# Patient Record
Sex: Female | Born: 1935
Health system: Southern US, Community
[De-identification: ages and names within clinical notes are randomized; demographics above are authoritative.]

## PROBLEM LIST (undated history)

## (undated) DIAGNOSIS — M4800 Spinal stenosis, site unspecified: Secondary | ICD-10-CM

## (undated) DIAGNOSIS — I10 Essential (primary) hypertension: Secondary | ICD-10-CM

## (undated) DIAGNOSIS — J349 Unspecified disorder of nose and nasal sinuses: Secondary | ICD-10-CM

## (undated) DIAGNOSIS — R0602 Shortness of breath: Secondary | ICD-10-CM

## (undated) DIAGNOSIS — C50919 Malignant neoplasm of unspecified site of unspecified female breast: Secondary | ICD-10-CM

## (undated) DIAGNOSIS — M810 Age-related osteoporosis without current pathological fracture: Secondary | ICD-10-CM

## (undated) DIAGNOSIS — M549 Dorsalgia, unspecified: Secondary | ICD-10-CM

## (undated) DIAGNOSIS — N3943 Post-void dribbling: Secondary | ICD-10-CM

## (undated) DIAGNOSIS — R194 Change in bowel habit: Secondary | ICD-10-CM

## (undated) DIAGNOSIS — Z972 Presence of dental prosthetic device (complete) (partial): Secondary | ICD-10-CM

## (undated) DIAGNOSIS — E079 Disorder of thyroid, unspecified: Secondary | ICD-10-CM

## (undated) DIAGNOSIS — Z9289 Personal history of other medical treatment: Secondary | ICD-10-CM

## (undated) DIAGNOSIS — D649 Anemia, unspecified: Secondary | ICD-10-CM

## (undated) HISTORY — DX: Anemia, unspecified: D64.9

## (undated) HISTORY — DX: Unspecified disorder of nose and nasal sinuses: J34.9

## (undated) HISTORY — DX: Disorder of thyroid, unspecified: E07.9

## (undated) HISTORY — DX: Change in bowel habit: R19.4

## (undated) HISTORY — DX: Post-void dribbling: N39.43

## (undated) HISTORY — DX: Presence of dental prosthetic device (complete) (partial): Z97.2

## (undated) HISTORY — DX: Shortness of breath: R06.02

## (undated) HISTORY — DX: Malignant neoplasm of unspecified site of unspecified female breast: C50.919

## (undated) HISTORY — DX: Dorsalgia, unspecified: M54.9

## (undated) HISTORY — DX: Essential (primary) hypertension: I10

## (undated) HISTORY — DX: Spinal stenosis, site unspecified: M48.00

## (undated) HISTORY — DX: Age-related osteoporosis without current pathological fracture: M81.0

## (undated) HISTORY — DX: Personal history of other medical treatment: Z92.89

---

## 1967-05-30 HISTORY — PX: BARTHOLIN GLAND CYST EXCISION: SHX565

## 1977-05-02 HISTORY — PX: BREAST LUMPECTOMY: SHX2

## 1999-12-13 ENCOUNTER — Inpatient Hospital Stay (HOSPITAL_COMMUNITY): Admission: EM | Admit: 1999-12-13 | Discharge: 1999-12-14 | Payer: Self-pay | Admitting: Emergency Medicine

## 1999-12-13 ENCOUNTER — Encounter: Payer: Self-pay | Admitting: Emergency Medicine

## 1999-12-15 ENCOUNTER — Encounter: Payer: Self-pay | Admitting: Emergency Medicine

## 1999-12-15 ENCOUNTER — Inpatient Hospital Stay (HOSPITAL_COMMUNITY): Admission: EM | Admit: 1999-12-15 | Discharge: 1999-12-28 | Payer: Self-pay | Admitting: Emergency Medicine

## 1999-12-15 ENCOUNTER — Encounter: Payer: Self-pay | Admitting: Internal Medicine

## 1999-12-15 HISTORY — PX: HERNIA REPAIR: SHX51

## 1999-12-17 ENCOUNTER — Encounter (HOSPITAL_BASED_OUTPATIENT_CLINIC_OR_DEPARTMENT_OTHER): Payer: Self-pay | Admitting: General Surgery

## 1999-12-18 ENCOUNTER — Encounter (HOSPITAL_BASED_OUTPATIENT_CLINIC_OR_DEPARTMENT_OTHER): Payer: Self-pay | Admitting: General Surgery

## 1999-12-19 ENCOUNTER — Encounter: Payer: Self-pay | Admitting: Internal Medicine

## 1999-12-19 ENCOUNTER — Encounter (HOSPITAL_BASED_OUTPATIENT_CLINIC_OR_DEPARTMENT_OTHER): Payer: Self-pay | Admitting: General Surgery

## 1999-12-21 ENCOUNTER — Encounter: Payer: Self-pay | Admitting: Internal Medicine

## 1999-12-22 ENCOUNTER — Encounter: Payer: Self-pay | Admitting: Internal Medicine

## 1999-12-23 ENCOUNTER — Encounter: Payer: Self-pay | Admitting: Internal Medicine

## 1999-12-24 ENCOUNTER — Encounter: Payer: Self-pay | Admitting: Internal Medicine

## 1999-12-26 ENCOUNTER — Encounter: Payer: Self-pay | Admitting: Internal Medicine

## 2000-01-10 ENCOUNTER — Encounter: Admission: RE | Admit: 2000-01-10 | Discharge: 2000-01-10 | Payer: Self-pay | Admitting: Internal Medicine

## 2001-05-24 ENCOUNTER — Other Ambulatory Visit: Admission: RE | Admit: 2001-05-24 | Discharge: 2001-05-24 | Payer: Self-pay | Admitting: *Deleted

## 2004-04-26 ENCOUNTER — Other Ambulatory Visit: Admission: RE | Admit: 2004-04-26 | Discharge: 2004-04-26 | Payer: Self-pay | Admitting: *Deleted

## 2007-05-30 DIAGNOSIS — Z9289 Personal history of other medical treatment: Secondary | ICD-10-CM

## 2007-05-30 HISTORY — DX: Personal history of other medical treatment: Z92.89

## 2007-10-16 ENCOUNTER — Inpatient Hospital Stay (HOSPITAL_COMMUNITY): Admission: EM | Admit: 2007-10-16 | Discharge: 2007-10-18 | Payer: Self-pay | Admitting: Emergency Medicine

## 2010-06-23 ENCOUNTER — Ambulatory Visit
Admission: RE | Admit: 2010-06-23 | Discharge: 2010-06-23 | Payer: Self-pay | Source: Home / Self Care | Attending: Vascular Surgery | Admitting: Vascular Surgery

## 2010-06-24 NOTE — Assessment & Plan Note (Signed)
OFFICE VISIT  Candace Ross, Candace Ross DOB:  Jul 02, 1926                                       06/23/2010 CHART#:[id number]  CHIEF COMPLAINT:  Leg pain.  HISTORY OF PRESENT ILLNESS:  The patient is an 77 year old female referred by Dr. Eloise Harman for possible peripheral arterial disease.  The patient states she fell in December of 2010 and her legs have been hurting intermittently since then.  She states that over the last 2 months her right leg has hurt significantly worse.  She states that on some days she has no problems with her legs at all but on other days she states that her legs will hurt if she is sitting or walking.  Some days she is able to walk only 10 feet, some days she is able to walk as much as 2 blocks.  She describes the pain as achy in character.  She also notes that she has a mild limb discrepancy and has walked with a limp since birth.  She states that some of this may have contributed to her leg pain.  She denies rest pain.  She denies nonhealing ulcers on her feet.  CHRONIC MEDICAL PROBLEMS:  Include hypertension, congestive failure. These are currently stable and followed by Dr. Eloise Harman.  PAST SURGICAL HISTORY:  Bartholin cyst, left breast biopsy, femoral hernia, previous kidney donation.  SOCIAL HISTORY:  She is widowed.  No children.  Retired.  She does not smoke or consume alcohol regularly.  FAMILY HISTORY:  Not remarkable for vascular disease at age less than 52.  REVIEW OF SYSTEMS:  GENERAL:  She is 4 feet 10. PULMONARY:  She has occasional bronchitis. MUSCULOSKELETAL:  Muscle pain as described above. GI:  Hiatal hernia with some mild dysphagia. All other systems are negative.  MEDICATIONS:  Benicar, amlodipine, thyroid medication, latanoprost eyedrops, Bufferin and hydrocodone.  ALLERGIES:  She has no known drug allergies.  PHYSICAL EXAM:  Vital signs:  Blood pressure is 161/81 in the left arm, heart rate is 92 and  regular, respirations 18.  HEENT:  Unremarkable. Neck:  Has 2+ carotid pulses without bruit.  Chest:  Clear to auscultation.  Cardiac:  Regular rate and rhythm without murmur. Abdomen:  Soft, nontender, no masses.  Musculoskeletal:  She has no obvious major joint deformity.  Neurologic:  She has symmetric upper extremity and lower extremity motor strength which is 5/5.  Skin:  Has no open ulcers or rashes.  Peripheral vascular:  Shows 2+ femoral pulses bilaterally.  She has a 1+ dorsalis pedis pulse on the right with absent posterior tibial pulses.  I am unable to palpate pedal pulses in the left foot.  Both feet are pink, warm and well-perfused.  I reviewed her ABIs dated 05/13/2010 which showed an ABI on the right of 0.8 and on the left of 0.44.  In summary, I agree with Dr. Eloise Harman that the patient certainly has some component of neurogenic claudication type symptoms from her spine disease.  However, she probably also has some element of arterial occlusive disease contributing to some of her lower extremity symptoms. She does have a palpable pulse on the right leg so this does not really explain her symptoms on the right side.  However, she does have some evidence of occlusive disease in the left side with an ABI of 0.4 that may explain some of her left symptoms.  However, during her history she complained really of symptoms only in her right leg and no symptoms in her left so I do not believe that the arterial disease in her left leg is necessarily clinically relevant at this time.  I also discussed with her that she had evidence of this arterial occlusive disease but she was not at risk of limb loss currently and she was not really interested in any intervention unless absolutely necessary.  In light of this I have counseled her today in trying to start a walking program of 30 minutes daily.  She will follow up with [place] on an as-needed basis.    Candace Ross. Fields,  MD Electronically Signed  CEF/MEDQ  D:  06/24/2010  T:  06/24/2010  Job:  [id number]  cc:   Barry Dienes. Eloise Harman, M.D.

## 2010-07-14 ENCOUNTER — Ambulatory Visit: Payer: PRIVATE HEALTH INSURANCE

## 2010-10-11 NOTE — H&P (Signed)
NAMEANIE, JUNIEL NO.:  [id number]   MEDICAL RECORD NO.:  [id number]           PATIENT TYPE:   LOCATION:                                 FACILITY:   PHYSICIAN:  Barry Dienes. Eloise Harman, M.D.DATE OF BIRTH:  [birth year]   DATE OF ADMISSION:  DATE OF DISCHARGE:                              HISTORY & PHYSICAL   CHIEF COMPLAINT:  Fever.   HISTORY OF PRESENT ILLNESS:  The patient is a 77 year old white female  with 2-3 day history of a mildly productive cough with fever.  She has  also noted some chills and right neck and shoulder pain over the past 2  days.  She denies shortness of breath.  She does have mild dysuria  without frequency or abdominal pain, neck stiffness, headache, or  diarrhea.  She has had no contacts recently with fever and chills of  unknown etiology.  In the emergency room, she was given Rocephin and  Tylenol and feels much better.  She no longer has right shoulder pain.   PAST MEDICAL HISTORY:  Hypothyroidism, glaucoma, macular degeneration  status post nephrectomy in 1988 as a donor to her son, scoliosis,  osteoporosis, vitamin D deficiency, and hyperlipidemia.   MEDICATIONS PRIOR TO ADMISSION:  1. Synthroid 75 mcg p.o. daily.  2. Multivitamin 1 tab p.o. daily.  3. Vitamin D 800 units daily.  4. Calcium 500 mg daily.  5. Xalatan 0.005% eye drops daily.  6. Multiple supplements including magnesium, bilberry, coenzyme Q10,      vitamin B12, cranberry fruit, grape seed extract, and vitamin E.   ALLERGIES:  No known drug allergies.   PAST SURGICAL HISTORY:  1. In 1969, Bartholin cyst removal.  2. In 1975, cholecystectomy.  3. In 1978, breast lumpectomy (benign).  4. In 1988, unilateral nephrectomy as a kidney donor.  5. In 2001, incarcerated femoral hernia.   SOCIAL HISTORY:  She is a widow and is retired.  She has no history of  tobacco or alcohol abuse.   FAMILY HISTORY:  Her mother died at age 75 of pneumonia and her father  died at  age 16 of cancer of uncertain type.  There is a family history  of relatives that have had diabetes mellitus type 2 and colon cancer.  Her son died at age 67 of complications of diabetes mellitus type 2 and  heart disease on Oct 12, 2003   REVIEW OF SYSTEMS:  Notable for recent fever with chills and decreased  appetite.  She denies shortness of breath, substernal chest pain or  palpitations, or back pain.   INITIAL PHYSICAL EXAM:  VITAL SIGNS:  Blood pressure was 120/62, pulse  88, respirations 20, temperature 97.4 (note initial temperature was  103.4 degrees Fahrenheit), and pulse oxygen saturation 97% on room air.  GENERAL:  She is an elderly white female who is in no apparent distress  while lying supine in bed.  HEENT:  Within normal limits.  NECK:  Supple with a full range of motion and no carotid bruit or  jugular venous distention.  CHEST:  Clear to auscultation.  HEART:  Regular  rate and rhythm without significant murmur or gallop.  ABDOMEN:  Normal bowel sounds and no hepatosplenomegaly or tenderness.  EXTREMITIES:  Without cyanosis, clubbing, or edema.  NEUROLOGICAL:  Nonfocal, and she was alert and well oriented.   LABORATORY STUDIES:  Serum sodium 133, potassium 4.2, chloride 103, BUN  15, creatinine 1.3, and glucose 156.  White blood cell count 12.7 with  87% neutrophils, 5% lymphs, and 8% monocytes.  Hemoglobin 12.3,  hematocrit 35.8, and platelets 248,000.  Urinalysis had specific gravity  of 1.016 and was nitrite negative with 21-50 white blood cells, 3-6 red  blood cells and few bacteria.  A chest x-ray showed no acute  cardiopulmonary disease.   IMPRESSION AND PLAN:  1. Fever most likely secondary to early pneumonia versus an urinary      tract infection, a hepatobiliary etiology causing right shoulder      pain seems less likely with her being status post cholecystectomy.      I plan to continue Rocephin IV.  We will check urine culture      results.  We will  continue IV fluid and overnight observation.  We      will recheck a CBC in the morning.  2. Hypothyroidism:  She is clinically euthyroid on Synthroid 75 mcg      daily which will be continued.           ______________________________  Barry Dienes Eloise Harman, M.D.     DGP/MEDQ  D:  10/16/2007  T:  10/17/2007  Job:  [id number]

## 2010-10-14 NOTE — Op Note (Signed)
[place]. [facility]  Patient:    Candace Ross, Candace Ross                      MRN: [id number] Proc. Date: 12/15/99 Adm. Date:  [id number] Attending:  Alfonso Ramus CC:         Two copies to Dr. Lurene Shadow                           Operative Report  PREOPERATIVE DIAGNOSIS:  Strangulated femoral hernia.  POSTOPERATIVE DIAGNOSIS:  Strangulated femoral hernia.  OPERATION: 1. Exploratory laparotomy and small bowel resection. 2. Repair of strangulated femoral hernia.  SURGEON:  [name], M.D.  ASSISTANT:  Marnee Spring. Wiliam Ke, M.D.  ANESTHESIA:  General  NOTE:  This patient is a 77 year old woman presenting through the emergency room with severe right groin pain with a right sided groin mass associated abdominal distention, nausea, and vomiting. She was evaluated by CT scan of the abdomen which showed that she had a strangulated femoral hernia with a loop of bowel caught within the right groin.  She was prepared and taken to the operating room for repair.  DESCRIPTION OF PROCEDURE:  Following induction of anesthesia, the patient positioned supinely, the abdomen and groin prepped and draped to be included in a sterile operative field.  By a transverse obliquely placed incision over the right groin mass deepened through the skin and subcutaneous tissue and carried down to the incarcerated hernial sac.  Upon opening up the sac there was a portion of gangrenous bowel caught within the hernia.  Attention was then turned to the abdomen where a McBurney type incision was made in the right lower quadrant, deepened through the skin and subcutaneous tissue with division of the muscles in direction of their fibers down to the peritoneum. The peritoneum was opened and the incarcerated bowel was reduced back into the peritoneal cavity and brought up at the region of the wound.  The nonviable bowel was then resected between GIA staplers with the mesentry being  taken between clamps and secured with ties of 2-0 silk.  A functional end to end anastomosis was then carried out with GIA stapler and TA 55 stapling device with a good and adequate lumen and the mesenteric defect was closed with interrupted 3-0 Vicryl sutures.  The bowel was then returned to the peritoneal cavity and the abdomen wound closed in layers as follows:  The peritoneum closed in a running 0 Vicryl and the flank muscles reapproximated with running Vicryl sutures.  The subcutaneous tissues was closed with 2-0 Vicryl and the skin closed with staples.  We then changed our gown and gloves and returned to the groin where the groin hernia was repaired by placing a mesh plug into the defect and suturing this down to the Coopers ligament and to the inguinal ligament.  Thus closing the hernial defect.  Sponge, instrument and sharp counts were again verified and the groin closed in layers with the subcutaneous tissues being closed with interrupted 2-0 Vicryl sutures and the skin closed with staples. Sterile dressings were applied.  Anesthetic was reversed. The patient was removed from the operating room to the recovery room in stable condition, having tolerated the procedure well. DD:  12/15/99 TD:  12/17/99 Job: [id number]

## 2010-10-14 NOTE — Discharge Summary (Signed)
[place]. [facility]  Patient:    Candace Ross, Candace Ross                      MRN: [id number] Adm. Date:  [id number] Disc. Date: 12/14/99 Attending:  Alfonso Ramus Dictator:   Nancee Liter, M.D. CC:         Outpatient Clinic             Jesse Sans. Wall, M.D. [facility]             [facility]                           Discharge Summary  1926/06/05  DISCHARGE DIAGNOSES: 1. Gastroenteritis, likely viral. 2. Sinus tachycardia with occasional premature atrial contractions. 3. Hypothyroidism.  DISCHARGE MEDICATIONS: 1. Metoprolol 25 mg 1 p.o. b.i.d. 2. Levothyroxine 12.5 mcg 1 p.o. q.d. 3. Percocet 325/5 mg 1 p.o. q.4-6h. p.r.n. #15.  FOLLOW-UP: 1. On December 26, 1999, at 3 p.m. with [facility], medicine outpatient clinic. 2. On December 28, 1999, with [facility] at 11:30 a.m.  Please check    thyroid stimulating hormone after two to four weeks and assess for side    effects of thyroid hormone treatment and need for changes in levothyroxine    treatment.  PROCEDURES: 1. December 14, 1999, echocardiogram showing ejection fraction of 55%. 2. December 13, 1999, chest x-ray showing slight cardiomegaly, positive scoliosis,    but no acute disease. 3. December 13, 1999, abdominal x-ray which was negative. 4. December 13, 1999, abdominal CT showing a few diverticula, but no evidence of    diverticulitis.  Appendix was not visualized.  Hiatal hernia was also    mentioned. 5. Pelvic CT on December 13, 1999, was unremarkable.  CONSULTATIONS:  Dr. Daleen Squibb, cardiology.  HISTORY OF PRESENT ILLNESS:  The patient presented with atypical chest pain and abdominal pain and underwent EKG, which showed nonspecific changes though an abnormal rate, and she was admitted for rule out MI and for assessment and treatment of her apparent gastroenteritis and this new arrhythmia.  LABORATORY DATA:  On admission, her basic metabolic panel was normal except for a sodium of 132 and glucose  of 120.  She had elevated white blood cell count at 13.6, hemoglobin and hematocrit were 13.0 and 39.1, and platelets 372.  LFTs were completely normal.  Amylase and lipase were also normal.  One set of cardiac enzymes was within normal limits.  A urinalysis was also checked and was significant for trace leukocytes and 6-10 white blood cells, but nitrites were negative and few bacteria were seen.  HOSPITAL COURSE:  The patient was admitted and, upon transfer to the floor, began vomiting.  While watched on telemetry, she also demonstrated considerable atrial and ventricular ectopy as well as sinus pauses and sinus tachycardia.  #1 - ARRHYTHMIA:  Cardiology was consulted and recommended the institution of metoprolol 25 mg p.o. b.i.d., which was done.  They also recommended echocardiography, which was done on December 14, 1999, and showed an ejection fraction of 55% without focal wall motion abnormalities.  They further recommended thyroid stimulating hormone be checked, which was done and was elevated at 10.689.  Throughout hypothyroidism was unlikely, because of her arrhythmia, levothyroxine was nonetheless started because of this finding. Further cardiac enzymes were negative x 3, and electrocardiogram showed no acute changes indicative of acute myocardial infarction.  Importantly, this patients arrhythmia was unknown to the  patient and occurred mostly when she was vomiting and receiving Phenergan for that vomiting whose anticholinergic effects may have contributed to atrial excitability.  As the patient was asymptomatic with these episodes and her vital signs remained stable, she was deemed safe for discharge.  #2 - VOMITING:  The patient had vomited a number of times overnight and was treated symptomatically with Phenergan.  She never complained of acute abdominal pain.  The vomiting resolved that night.  The patient tolerated a regular diet the next day without complaints and without  further vomiting, and she was discharged home.  DISCHARGE LABORATORY DATA:  White blood cell count 17.1, hemoglobin 15.5, hematocrit 39.9, platelets 36.5.  Sodium 132, potassium 3.7, chloride 94, bicarbonate 26, BUN 8, creatinine 0.9, glucose 140, calcium 9.8. DD:  12/19/99 TD:  12/22/99 Job: [id number]

## 2010-10-14 NOTE — Discharge Summary (Signed)
NAMEARDRA, KUZNICKI NO.:  [id number]   MEDICAL RECORD NO.:  [id number]          PATIENT TYPE:  INP   LOCATION:  6732                         FACILITY:  [facility]   PHYSICIAN:  Barry Dienes. Eloise Harman, M.D.DATE OF BIRTH:  1927-03-10   DATE OF ADMISSION:  10/16/2007  DATE OF DISCHARGE:  10/18/2007                               DISCHARGE SUMMARY   PERTINENT FINDINGS:  The patient is a 77 year old white female who had 3-  day history of mildly productive cough with fever.  She also noted some  chills and right neck and shoulder pain for 2 days prior to admission.  She denied shortness of breath.  She also complained of mild dysuria  without frequency or abdominal pain, neck stiffness, headache, or  diarrhea.   PAST MEDICAL HISTORY:  Significant for hypothyroidism, glaucoma, macular  degeneration, status post nephrectomy in 1988 as a donor to her son,  scoliosis, osteoporosis, vitamin D deficiency, and hyperlipidemia.   INITIAL PHYSICAL EXAM:  VITAL SIGNS:  Blood pressure 120/62, pulse 88,  respirations 20, temperature 97.4 (note initial temperature in the  emergency room was 103.4 degrees Fahrenheit), and pulse oxygen  saturation 97% on room air.  IN GENERAL:  She was an elderly white female who is in no apparent  distress lying supine in bed.  HEAD, EYES, EARS, NOSE, AND THROAT:  Within normal limits.  NECK:  Supple and without carotid bruit or jugular venous distention.  CHEST:  Clear to auscultation.  HEART:  Regular rate and rhythm without significant murmur or gallops.  ABDOMEN:  Normal bowel sounds and no tenderness.  EXTREMITIES:  Without edema.  NEUROLOGICAL:  Nonfocal.   LABORATORY STUDIES:  Serum sodium 133, potassium 4.2, chloride 103, BUN  15, creatinine 1.3, and glucose 156.  White blood cells 12.7 with 87%  neutrophils, 5% lymphs, 8% monocytes, hemoglobin 12.3, hematocrit 35.8,  and platelets 248.  Urinalysis had specific gravity of 1.016, nitrite  negative with 21-50 white blood cells, 3-6 red blood cells, and few  bacteria.  A chest x-ray showed no acute cardiopulmonary disease.   HOSPITAL COURSE:  The patient was admitted with presumptive diagnosis of  early pneumonia versus urinary tract infection.  She was started on IV  Rocephin and IV fluids.  She continued to have low grade fevers for 24  hours.  A urine culture grew back Escherichia coli greater than 100,000  colonies per milliliter that was pansensitive.   PROCEDURES:  None.   COMPLICATIONS:  None.   CONDITION ON DISCHARGE:  She was feeling much better with a normal  appetite and no weakness or fever or chills.  Most recent physical exam  shows blood pressure 149/71, pulse 73, respirations 20, temperature 98,  and pulse oxygen saturation 96% on room air.  In general, she is an  elderly white female who is in no apparent distress.  Chest was clear to  auscultation.  Heart had a regular rate and rhythm.  Abdomen was benign.  Extremities were without edema.  She was able to change from sitting  position to standing position and walking her room without assistance.  Most recent laboratory studies:  White blood cells 5.9, hemoglobin 9.9,  hematocrit 30, and platelets 217.  Serum sodium 132, potassium 4.2,  chloride 100, carbon dioxide 22, BUN 15, creatinine 1.39, and glucose  159.   DISCHARGE DIAGNOSES:  1. Acute pyelonephritis with Escherichia coli.  2. Hypothyroidism.  3. Glaucoma.   DISCHARGE MEDICATIONS:  1. Macrobid 100 mg 1 tablet p.o. b.i.d. for 5 days.  2. Synthroid 75 mcg p.o. daily.  3. Xalatan 0.005% 1 drop each eye daily.  4. Multivitamin once daily.  5. Calcium with vitamin D once daily and resume other nutrition      supplements.   DISPOSITION AND FOLLOWUP:  The patient will be seen at [facility] in approximately 2 weeks following discharge by Dr. Jarome Matin.           ______________________________  Barry Dienes Eloise Harman,  M.D.     DGP/MEDQ  D:  11/20/2007  T:  11/21/2007  Job:  [id number]

## 2010-10-14 NOTE — Discharge Summary (Signed)
[place]. [facility]  Patient:    Candace Ross, Candace Ross                      MRN: [id number] Adm. Date:  [id number] Disc. Date: [id number] Attending:  Alfonso Ramus Dictator:   Adrian Prows, M.D. CC:         Outpatient [facility]             [name], M.D.             Thomas C. Wall, M.D. [facility]                           Discharge Summary  DATE OF BIRTH:  02-Sep-1926  DISCHARGE DIAGNOSES:  1. Status post exploratory laparotomy and partial small bowel resection with     prolonged postoperative ileus.  2. Status post aspiration pneumonia with complicating respiratory failure.  3. Anemia.  4. Hypothyroidism.  5. Status post renal explant.  6. Cardiac arrhythmia with one episode of atrial fibrillation.  7. Hyponatremia, mild and likely secondary to syndrome of inappropriate     antidiuretic hormone secretion.  DISCHARGE MEDICATIONS:  1. Levoxyl 12.5 mcg 1 p.o. q.d.  2. Percocet 325/5 1 p.o. q.4-6h. p.r.n. pain.  3. Ciprofloxacin 500 mg p.o. b.i.d. through January 09, 2000.  4. Flagyl 500 mg p.o. b.i.d. through January 09, 2000.  FOLLOW-UP:  1. Home health nurse to check on the patient as arranged by the case manager.  2. In two weeks with Dr. Leonie Man as scheduled.  3. With [facility] on January 09, 2000 at 12:30.  4. At the medicine acute care clinic on January 10, 2000 at 2:30 p.m.  Please     check CBC for anemia and consider the possibility of Epogen.  Also check     thyroid-stimulating hormone for response to therapy.  PROCEDURES:  1. Abdominal CT December 15, 1999 showing incarcerated inguinal hernia on small     bowel obstruction.  2. Exploratory laparotomy and partial small bowel resection with inguinal     hernia repair on December 15, 1999.  3. Intubation on December 19, 1999.  4. Extubation on December 20, 1999.  5. Left subclavian vein central line placement on December 19, 1999.  6. Nasogastric tube placement  on December 19, 1999.  7. Institution of total parenteral nutrition on December 20, 1999.  8. CT of the abdomen and pelvis with no evidence of an abscess on     December 23, 1999.  9. Numerous chest x-rays showing evolution and resolution of aspiration     pneumonia. 10. Multiple KUBs to assess for obstruction and perforation of bowels without     evidence of obstruction or perforation.  CONSULTATIONS:  Luisa Hart L. Lurene Shadow, M.D., general surgery.  HISTORY OF PRESENT ILLNESS:  This is a 77 year old woman recently discharged from [facility] one day prior to admission with the presumptive diagnosis of viral gastroenteritis who represents with complaints of vomiting, abdominal pain, and a solid knot which arose overnight in her lower abdomen.  PHYSICAL EXAMINATION:  GENERAL:  She was in moderate distress.  VITAL SIGNS:  Temperature 97.6, heart rate 88, blood pressure 125/77, respiratory rate 20, saturation 97% on room air.  HEENT:  Oropharynx was dry with vomitus.  She had no lymphadenopathy or thyromegaly.  CARDIOVASCULAR:  Regular rate  and rhythm without murmurs, rubs, or gallops. No JVD, 2+ palpable distal pulses.  RESPIRATORY:  Clear to auscultation bilaterally.  GASTROINTESTINAL:  Decreased bowel sounds, soft, moderate distension, diffusely tender to palpation.  No rebound, no guarding, and positive tender erythematous inguinal hernia on the right.  EXTREMITIES:  No cyanosis, clubbing, or edema.  NEUROLOGIC:  Alert and oriented x 3.  Cranial nerves were intact.  Deep tendon reflexes were 2+ in bilateral upper and lower extremities.  ADMISSION LABORATORY DATA:  Amylase was 93.  PT 13.9, PTT 31, INR 1.2.  Sodium 129, potassium 3.9, chloride 96, bicarb 22, BUN 23, creatinine 1.4, glucose 143, calcium 9.7, total protein 7.4, albumin 3.9, alkaline phosphatase 94, AST 122, ALT 118, total bilirubin 1.7.  UA showed moderate bilirubin, 15 ketones, 30 protein, and small leukocyte esterase.   White blood cell count was 18.2, H&H 14.2, and 40.0, platelets 391.  An abdominopelvic CT was done and showed positive incarcerated inguinal hernia, small bowel obstruction, and dilated loops of bowel with air fluid levels.  HOSPITAL COURSE: #1 - GASTROINTESTINAL:  Candace Ross underwent surgery to remove about 10 cm of gangrenous bowel from her incarcerated hernia on the night of admission and went through the procedure without difficulty; however, postoperatively, her bowel function was extremely slow to recover and her hunger encouraged advancement of her diet.  On the morning of December 17, 1999, she began to have shortness of breath, which was later recognized as secondary to aspiration of gastric contents, as per #2 below.  On December 19, 1999, what appeared to be undigested food was suctioned from her trachea and she was again made n.p.o. KUB that day showed no evidence of obstruction or perforation but still some evidence of small bowel dilation, consistent with ileus and an NG tube was placed for decompression and prevention of further aspiration.  Given her longstanding poor p.o. intake, total parenteral nutrition was started on December 20, 1999 and was finally discontinued a few days later, when multiple BMs and positive bowel sounds indicated return of her bowel function and her ability to fully provide ______.  Her diet was very slowly advanced after that point. She continued to tolerate it with some gastric discomfort but without further nausea or vomiting or episodes of aspiration.  #2 - PULMONARY:  The patients pulmonary status was normal until the morning of December 17, 1999, which was postoperative day #2, when she had increased shortness of breath and decreased oxygen saturation to the high 80s with tachypnea.  She was thought at that point to have an exacerbation of CHF, as she sounded wet bilaterally on exam and had been 4 L positive fluid intake on  the day after surgery.   She was given Lasix and the digoxin without significant diuresis or clinical improvement.  Chest x-ray at that time showed atelectasis and a small right-sided infiltrate but exam still appeared consistent with pulmonary edema, i.e. her breath sounds sounded coarse diffusely.  She continued to tolerate her diet.  On December 19, 1999, however, mental status declined significantly and her work of breathing increased.  At that time, an ABG and arterial blood gas showed an acute respiratory alkalosis with pH 7.31 and PCO2 57.8 on 10 L by face mask.  She continued to increase her difficulty with breathing and was intubated on December 19, 1999. Tracheal aspirate at the time of intubation produced fluid appearing to be aspirated gastric contents, so she was started on Zosyn to cover aspiration pneumonia and continued  on that treatment throughout her hospitalization.  No sputum cultures were ever able to be obtained.  With suctioning and antibiotics, she quickly recovered and tolerated extubation on December 20, 1999. While her ileus remained, NG suction was continued and she never experienced further aspiration.  Her white blood cell count slowly decreased with continuing antibiotics and she did not spike fevers after December 20, 1999.  She was slowly weaned off her oxygen and tolerated no oxygen, even with exercise on day prior to discharge.  She was sent home with two weeks of oral Flagyl and ciprofloxacin to cover her resolving aspiration pneumonia.  #3 - CARDIOVASCULAR:  Atrial fibrillation was noted on telemetry acutely to start on the morning of December 17, 1999.  EKG at that point showed no acute infarct.  Diltiazem was infused and atrial fibrillation stopped and her rhythm was controlled and diltiazem was continued until her pressures dropped acutely with intubation on the next day.  It was decided that diltiazem should be held and that was done throughout the remainder of her hospitalization, given  her low diastolic pressures and lack of atrial fibrillation on telemetry.  #4 - ANEMIA:  The patients hemoglobin dropped from 11.4 to 8.7 slowly between December 17, 1999 and December 22, 1999 and then stabilized.  Stool FOBT was positive, which was not surprising, given her surgery, but the drop was low and self-limiting and thus was just watched.  It is likely that this patient will need Epogen at some point, especially given her single kidney.  Her primary M.D. should recheck a CBC and consider that possibility in the future.  #5 - HYPOTHYROIDISM:  The patients TSH was checked and was 10.7 on December 14, 1999 during her recent hospitalization.  She was put on Levoxyl 12.5 mcg one p.o. q.d.  Again, this should be followed as an outpatient.  #6 - RENAL:  Candace Ross creatinine rose from its baseline of 0.9 on December 17, 1999 to 1.3 on December 18, 1999, likely due to prerenal dehydration secondary to diuretic use to treat her presumed CHF exacerbation.  With rehydration, her creatinine returned to its baseline over the next two to three days without further difficulties.  Creatinine was 0.8 on discharge.  DISCHARGE LABORATORY DATA:  On December 28, 1999:  White blood cells 12.2, hemoglobin 8.3, hematocrit 24.8, platelets 384.  On December 27, 1999:  Sodium 129, potassium 3.4, chloride 94, bicarb 29, BUN 10, creatinine 0.8, glucose 120, calcium 7.7.  It should be noted that the potassium of 3.4 was before repletion with IV potassium. DD:  12/29/99 TD:  12/31/99 Job: [id number]

## 2011-02-22 LAB — POCT I-STAT, CHEM 8
BUN: 15
Calcium, Ion: 1.03 — ABNORMAL LOW
Chloride: 103
Creatinine, Ser: 1.3 — ABNORMAL HIGH
Glucose, Bld: 156 — ABNORMAL HIGH
HCT: 37
Hemoglobin: 12.6
Potassium: 4.2
Sodium: 133 — ABNORMAL LOW
TCO2: 21

## 2011-02-22 LAB — DIFFERENTIAL
Basophils Absolute: 0
Basophils Relative: 0
Eosinophils Absolute: 0
Eosinophils Relative: 0
Lymphocytes Relative: 5 — ABNORMAL LOW
Lymphs Abs: 0.6 — ABNORMAL LOW
Monocytes Absolute: 1
Monocytes Relative: 8
Neutro Abs: 11.1 — ABNORMAL HIGH
Neutrophils Relative %: 87 — ABNORMAL HIGH

## 2011-02-22 LAB — POCT CARDIAC MARKERS
CKMB, poc: <1 — ABNORMAL LOW
Myoglobin, poc: 276
Operator id: 294501
Troponin i, poc: <0.05

## 2011-02-22 LAB — URINALYSIS, ROUTINE W REFLEX MICROSCOPIC
Bilirubin Urine: NEGATIVE
Nitrite: NEGATIVE
Specific Gravity, Urine: 1.016
Urobilinogen, UA: 1
pH: 6

## 2011-02-22 LAB — CBC
HCT: 30 — ABNORMAL LOW
HCT: 35.8 — ABNORMAL LOW
Hemoglobin: 12.3
MCHC: 34.3
MCV: 82.8
MCV: 83.1
MCV: 83.6
Platelets: 217
Platelets: 230
Platelets: 248
RBC: 3.63 — ABNORMAL LOW
RBC: 4.21
RBC: 4.3
RDW: 14.5
WBC: 11.3 — ABNORMAL HIGH
WBC: 12.7 — ABNORMAL HIGH
WBC: 5.9

## 2011-02-22 LAB — COMPREHENSIVE METABOLIC PANEL
ALT: 25
Alkaline Phosphatase: 74
BUN: 15
CO2: 22
Chloride: 100
Glucose, Bld: 157 — ABNORMAL HIGH
Potassium: 4
Sodium: 132 — ABNORMAL LOW
Total Bilirubin: 0.8
Total Protein: 6.1

## 2011-02-22 LAB — URINE MICROSCOPIC-ADD ON

## 2011-02-22 LAB — URINE CULTURE

## 2011-10-17 ENCOUNTER — Other Ambulatory Visit: Payer: Self-pay

## 2011-10-18 ENCOUNTER — Other Ambulatory Visit: Payer: Self-pay | Admitting: Radiology

## 2011-10-18 DIAGNOSIS — C50912 Malignant neoplasm of unspecified site of left female breast: Secondary | ICD-10-CM

## 2011-10-20 ENCOUNTER — Other Ambulatory Visit: Payer: Self-pay | Admitting: *Deleted

## 2011-10-20 ENCOUNTER — Telehealth: Payer: Self-pay | Admitting: *Deleted

## 2011-10-20 DIAGNOSIS — C50119 Malignant neoplasm of central portion of unspecified female breast: Secondary | ICD-10-CM | POA: Insufficient documentation

## 2011-10-20 NOTE — Telephone Encounter (Signed)
Confirmed [facility] for 10/25/11 at 0830  Instructions and contact information given. Pt request for labs to be drawn a PCP.  Left vm for Dr. Norval Gable nurse to request labs to be drawn.

## 2011-10-24 ENCOUNTER — Ambulatory Visit
Admission: RE | Admit: 2011-10-24 | Discharge: 2011-10-24 | Disposition: A | Discharge status: Home / Self Care | Payer: Medicare Other | Source: Ambulatory Visit | Attending: Radiology | Admitting: Radiology

## 2011-10-24 DIAGNOSIS — C50912 Malignant neoplasm of unspecified site of left female breast: Secondary | ICD-10-CM

## 2011-10-24 MED ORDER — GADOBENATE DIMEGLUMINE 529 MG/ML IV SOLN
12.0000 mL | Freq: Once | INTRAVENOUS | Status: AC | PRN
  Administered 2011-10-24: 12 mL via INTRAVENOUS

## 2011-10-25 ENCOUNTER — Ambulatory Visit (HOSPITAL_BASED_OUTPATIENT_CLINIC_OR_DEPARTMENT_OTHER): Payer: Medicare Other | Admitting: Oncology

## 2011-10-25 ENCOUNTER — Encounter: Payer: Self-pay | Admitting: *Deleted

## 2011-10-25 ENCOUNTER — Ambulatory Visit: Payer: Medicare Other

## 2011-10-25 ENCOUNTER — Ambulatory Visit (HOSPITAL_BASED_OUTPATIENT_CLINIC_OR_DEPARTMENT_OTHER): Payer: Medicare Other | Admitting: General Surgery

## 2011-10-25 ENCOUNTER — Ambulatory Visit
Admission: RE | Admit: 2011-10-25 | Discharge: 2011-10-25 | Disposition: A | Discharge status: Home / Self Care | Payer: Medicare Other | Source: Ambulatory Visit | Attending: Radiation Oncology | Admitting: Radiation Oncology

## 2011-10-25 ENCOUNTER — Encounter: Payer: Self-pay | Admitting: Oncology

## 2011-10-25 ENCOUNTER — Other Ambulatory Visit: Payer: Medicare Other | Admitting: Lab

## 2011-10-25 ENCOUNTER — Ambulatory Visit: Payer: Medicare Other | Admitting: Physical Therapy

## 2011-10-25 VITALS — BP 156/77 | HR 105 | Temp 97.8°F | Ht 59.5 in | Wt 133.2 lb

## 2011-10-25 DIAGNOSIS — C50119 Malignant neoplasm of central portion of unspecified female breast: Secondary | ICD-10-CM

## 2011-10-25 DIAGNOSIS — C50919 Malignant neoplasm of unspecified site of unspecified female breast: Secondary | ICD-10-CM

## 2011-10-25 LAB — CBC WITH DIFFERENTIAL/PLATELET
Basophils Absolute: 0.1 10*3/uL (ref 0.0–0.1)
Eosinophils Absolute: 0.2 10*3/uL (ref 0.0–0.5)
HCT: 35.9 % (ref 34.8–46.6)
HGB: 11.8 g/dL (ref 11.6–15.9)
MCV: 83.4 fL (ref 79.5–101.0)
NEUT#: 4.8 10*3/uL (ref 1.5–6.5)
NEUT%: 62.9 % (ref 38.4–76.8)
RDW: 16 % — ABNORMAL HIGH (ref 11.2–14.5)
lymph#: 1.8 10*3/uL (ref 0.9–3.3)

## 2011-10-25 LAB — COMPREHENSIVE METABOLIC PANEL
Albumin: 3.9 g/dL (ref 3.5–5.2)
BUN: 15 mg/dL (ref 6–23)
CO2: 24 mEq/L (ref 19–32)
Calcium: 9.7 mg/dL (ref 8.4–10.5)
Glucose, Bld: 119 mg/dL — ABNORMAL HIGH (ref 70–99)
Potassium: 3.8 mEq/L (ref 3.5–5.3)
Sodium: 135 mEq/L (ref 135–145)
Total Protein: 7.3 g/dL (ref 6.0–8.3)

## 2011-10-25 LAB — TSH: TSH: 0.637 u[IU]/mL (ref 0.350–4.500)

## 2011-10-25 LAB — CANCER ANTIGEN 27.29: CA 27.29: 21 U/mL (ref 0–39)

## 2011-10-25 NOTE — Progress Notes (Signed)
Patient came in today as a new patient and she has one insurance.I did give her a medicaid application to fill out.I did explain to her our co-pay assistance program we offer here for medication and chemo drugs.

## 2011-10-25 NOTE — Progress Notes (Signed)
Patient ID: Candace Ross, female   DOB: 05-16-1927, 78 y.o.   MRN: [id number]  No chief complaint on file.   HPI Candace Ross is a 78 y.o. female.  This patient is referred to the multidisciplinary breast clinic for evaluation of a newly diagnosed left breast invasive ductal carcinoma. She says that she felt that it was time for mammograms because she felt that something wasn't right in her breast but she did not have any palpable masses or other symptoms. She says that she then went for mammogram and this was diagnosed there was no ultrasound correlates found an MRI demonstrated only biopsy changes no residual mass. Her pathology was consistent with invasive ductal carcinoma which was strongly ER and PR positive. Otherwise she is not on any hormones and denies any other changes or masses. She did have a benign left breast biopsy in the 1970s but no other history of breast cancer. HPI  Past Medical History  Diagnosis Date  . Breast cancer   . Hypertension   . H/O bone density study 2009  . Hearing loss   . Sinus problem   . Wears dentures   . Dribbling urine   . Thyroid disease   . Back pain   . Anemia   . Change in stool habits   . SOB (shortness of breath) on exertion     stairs    Past Surgical History  Procedure Date  . Hernia repair 12/15/99  . Breast lumpectomy 05/02/77    benign  . Bartholin gland cyst excision 1969    Family History  Problem Relation Age of Onset  . Colon cancer Brother     Social History History  Substance Use Topics  . Smoking status: Never Smoker   . Smokeless tobacco: Not on file  . Alcohol Use: No    Allergies  Allergen Reactions  . Other Other (See Comments)    "some medicine they gave me in the hospital, it was a little purple pill, to help me sleep didn't work; it made me mean"    Current Outpatient Prescriptions  Medication Sig Dispense Refill  . amLODipine (NORVASC) 5 MG tablet Take 5 mg by mouth daily.      .  calcium-vitamin D 250-100 MG-UNIT per tablet Take 1 tablet by mouth 2 (two) times daily.      Marland Kitchen latanoprost (XALATAN) 0.005 % ophthalmic solution 1 drop at bedtime.      Marland Kitchen levothyroxine (SYNTHROID, LEVOTHROID) 100 MCG tablet Take 100 mcg by mouth daily.      Marland Kitchen olmesartan (BENICAR) 20 MG tablet Take 20 mg by mouth daily.        Review of Systems Review of Systems All other review of systems negative or noncontributory except as stated in the HPI   There were no vitals taken for this visit. Wt Readings from Last 3 Encounters:  10/25/11 133 lb 3.2 oz (60.419 kg)   Temp Readings from Last 3 Encounters:  10/25/11 97.8 F (36.6 C) Oral   BP Readings from Last 3 Encounters:  10/25/11 156/77   Pulse Readings from Last 3 Encounters:  10/25/11 105    Physical Exam Physical Exam Physical Exam  Nursing note and vitals reviewed. Constitutional: She is oriented to person, place, and time. She appears well-developed and well-nourished. No distress.  HENT:  Head: Normocephalic and atraumatic.  Mouth/Throat: No oropharyngeal exudate.  Eyes: Conjunctivae and EOM are normal. Pupils are equal, round, and reactive to light. Right eye exhibits no discharge.  Left eye exhibits no discharge. No scleral icterus.  Neck: Normal range of motion. Neck supple. No tracheal deviation present.  Cardiovascular: Normal rate, regular rhythm, normal heart sounds and intact distal pulses.   Pulmonary/Chest: Effort normal and breath sounds normal. No stridor. No respiratory distress. She has no wheezes.  Abdominal: Soft. Bowel sounds are normal. She exhibits no distension and no mass. There is no tenderness. There is no rebound and no guarding.  Musculoskeletal: Normal range of motion. She exhibits no edema and no tenderness.  Neurological: She is alert and oriented to person, place, and time.  Skin: Skin is warm and dry. No rash noted. She is not diaphoretic. No erythema. No pallor.  Psychiatric: She has a normal  mood and affect. Her behavior is normal. Judgment and thought content normal.  Breast: I do not appreciate any palpable or suspicious masses on the left. She is biopsy changes but no skin changes or lymphadenopathy. Her right breast is normal without any suspicious masses, skin changes, or lymphadenopathy.  Data Reviewed Mammogram, ultrasound, MRI, pathology report   Assessment    Left breast invasive ductal carcinoma We have discussed her treatment and our multidisciplinary breast conference and I have spent at least 45 minutes discussing the situation with the patient. I have recommended needle localized lumpectomy for treatment of her breast cancer. I explained that the standard traditionally is to add sentinel lymph node biopsy as well to evaluate her nodal status and to obtain staging information however, she has already with the oncologist and he is not going to offer her any chemotherapy given her age so no lymph node biopsy would be required. He may offer her hormonal treatment only. She is also met with the radiation oncologist and she would not likely receive any radiation therapy as well given her age. She expressed understanding of this and would like to proceed with left breast needle localized lumpectomy. Discussed the risks the procedure with her including infection, bleeding, pain, scarring, recurrence, need for reexcision, and again I explained that because of her age and that she would not be receiving chemotherapy this is the reason why we would not be performing sentinel lymph node biopsy.    Plan    We will plan for a left breast needle localized lumpectomy.       Lodema Pilot DAVID 10/25/2011, 12:33 PM

## 2011-10-25 NOTE — Progress Notes (Signed)
Radiation Oncology         [id number]) [id number] ________________________________  Initial outpatient Consultation  Name: Candace Ross MRN: [id number]  Date: 10/25/2011  DOB: Oct 10, 1926  REFERRING PHYSICIAN: Lodema Pilot, DO  DIAGNOSIS: T1N0 Invasive Ductal Carcinoma of the Left Breast  HISTORY OF PRESENT ILLNESS::Candace Ross is a 78 y.o. female  is referred to multidisciplinary clinic today. She states that she had some fatigue and women's intuition that something is going on with her breast. She presented for a screening mammogram was found to have an asymmetry on the left. This measured about 4 mm and there was no ultrasound correlate. Biopsy revealed a grade 1 invasive ductal carcinoma. This was ER/PR positive at 100%. An MRI of the bilateral breasts showed no other abnormalities. She is healed up well from her biopsy. She has many stories about building Museum/gallery conservator.    PREVIOUS RADIATION THERAPY: No  PAST MEDICAL HISTORY:  has a past medical history of Breast cancer; Hypertension; H/O bone density study (2009); Hearing loss; Sinus problem; Wears dentures; Dribbling urine; Thyroid disease; Back pain; Anemia; Change in stool habits; and SOB (shortness of breath) on exertion.    PAST SURGICAL HISTORY: Past Surgical History  Procedure Date  . Hernia repair 12/15/99  . Breast lumpectomy 05/02/77    benign  . Bartholin gland cyst excision 1969    FAMILY HISTORY: family history includes Colon cancer in her brother.  SOCIAL HISTORY:  reports that she has never smoked. She does not have any smokeless tobacco history on file. She reports that she does not drink alcohol or use illicit drugs.  ALLERGIES: Other  MEDICATIONS:  Current Outpatient Prescriptions  Medication Sig Dispense Refill  . amLODipine (NORVASC) 5 MG tablet Take 5 mg by mouth daily.      . calcium-vitamin D 250-100 MG-UNIT per tablet Take 1 tablet by mouth 2 (two) times daily.      Marland Kitchen latanoprost (XALATAN)  0.005 % ophthalmic solution 1 drop at bedtime.      Marland Kitchen levothyroxine (SYNTHROID, LEVOTHROID) 100 MCG tablet Take 100 mcg by mouth daily.      Marland Kitchen olmesartan (BENICAR) 20 MG tablet Take 20 mg by mouth daily.        REVIEW OF SYSTEMS:  A 15 point review of systems is documented in the electronic medical record. This was obtained by the nursing staff. However, I reviewed this with the patient to discuss relevant findings and make appropriate changes.  Pertinent items are noted in HPI.   PHYSICAL EXAM:  She appears her stated age. She is alert x3.  LABORATORY DATA:  Lab Results  Component Value Date   WBC 7.6 10/25/2011   HGB 11.8 10/25/2011   HCT 35.9 10/25/2011   MCV 83.4 10/25/2011   PLT 302 10/25/2011   Lab Results  Component Value Date   NA 135 10/25/2011   K 3.8 10/25/2011   CL 98 10/25/2011   CO2 24 10/25/2011   Lab Results  Component Value Date   ALT 12 10/25/2011   AST 19 10/25/2011   ALKPHOS 75 10/25/2011   BILITOT 0.4 10/25/2011     RADIOGRAPHY: Mr Breast Bilateral W Wo Contrast  10/24/2011  *RADIOLOGY REPORT*  Clinical Data: Newly-diagnosed left breast 12 o'clock location invasive ductal carcinoma manifesting as a mammographically evident mass  BUN and creatinine were obtained on site at [facility] at [address] Results:  BUN 16 mg/dL,  Creatinine 0.8 mg/dL.  BILATERAL BREAST MRI WITH AND  WITHOUT CONTRAST  Technique: Multiplanar, multisequence MR images of both breasts were obtained prior to and following the intravenous administration of 12ml of Multihance.  Three dimensional images were evaluated at the independent DynaCad workstation.  Comparison:  Prior mammograms at [facility]  Findings: Post biopsy change with clip artifact is noted in the left breast 12 o'clock location.  There is thin and mildly inhomogeneous rim enhancement of the biopsy site demonstrating plateau type enhancement kinetics but no focal abnormal enhancement elsewhere.  No lymphadenopathy or other  T2-weighted abnormal hyperintensity. Minimally inhomogeneous fat saturation is noted on postcontrast images, which is not felt to significantly affect the diagnostic quality of the examination.  IMPRESSION: Post biopsy change in the left breast 12 o'clock location at the site of biopsy-proven invasive ductal carcinoma.  No MRI evidence for multifocal/multicentric or contralateral malignancy.  THREE-DIMENSIONAL MR IMAGE RENDERING ON INDEPENDENT WORKSTATION:  Three-dimensional MR images were rendered by post-processing of the original MR data on an independent workstation.  The three- dimensional MR images were interpreted, and findings were reported in the accompanying complete MRI report for this study.  BI-RADS CATEGORY 6:  Known biopsy-proven malignancy - appropriate action should be taken.  Recommendation:  Treatment plan  Original Report Authenticated By: Harrel Lemon, M.D.      IMPRESSION: T1 N0 early stage breast cancer  PLAN: Ms. Candace Ross has an early stage breast cancer. She is clearly relatively healthy but has a very very small tumor. I think local excision followed antiestrogen therapy would that be the best course of action for. We discussed the role of radiation and decreasing local failure which given the size of her lesion as well as its characteristics I think she is at very low risk for as long as she has adequate surgical margins. Not recommended radiation this time. I be happy to see her back at any point in the future.  I spent 30 minutes  face to face with the patient and more than 50% of that time was spent in counseling and/or coordination of care.   ------------------------------------------------  Lurline Hare, MD

## 2011-10-25 NOTE — Progress Notes (Signed)
Mailed after appt letter to pt. 

## 2011-10-26 NOTE — Progress Notes (Signed)
Referral MD  Reason for Referral: Breast cancer      HPI: Candace Ross 78 year old woman from [place] referred for evaluation and treatment of her recently diagnosed breast cancer. This patient has undergone annual screening mammography. Screening mammogram from 09/27/2011 showed scattered parenchymal densities with potential abnormality in the left side. Additional views were recommended. Diagnostic mammogram and ultrasound performed on 10/05/2011 spot compression view showed a mass 12:00 position. Biopsy in the same day was performed. This showed an invasive ductal cancer of which was ER and PR positive. This could be a grade 1 lesion. ER and PR both 100% respectively.   Past Medical History  Diagnosis Date  .  benign breast lump removed 1978    . Hypertension   . H/O bone density study 2009  . Hearing loss   . Sinus problem   . Wears dentures   . Dribbling urine   . Thyroid disease   . Back pain   . Anemia   . Change in stool habits   . SOB (shortness of breath) on exertion     stairs  :  Past Surgical History  Procedure Date  . Hernia repair 12/15/99  . Breast lumpectomy 05/02/77    benign  . Bartholin gland cyst excision 1969  : Donor of kidney 2 son   Current outpatient prescriptions:amLODipine (NORVASC) 5 MG tablet, Take 5 mg by mouth daily., Disp: , Rfl: ;  calcium-vitamin D 250-100 MG-UNIT per tablet, Take 1 tablet by mouth 2 (two) times daily., Disp: , Rfl: ;  latanoprost (XALATAN) 0.005 % ophthalmic solution, 1 drop at bedtime., Disp: , Rfl: ;  levothyroxine (SYNTHROID, LEVOTHROID) 100 MCG tablet, Take 100 mcg by mouth daily., Disp: , Rfl:  olmesartan (BENICAR) 20 MG tablet, Take 20 mg by mouth daily., Disp: , Rfl: :    :  Allergies  Allergen Reactions  . Other Other (See Comments)    "some medicine they gave me in the hospital, it was a little purple pill, to help me sleep didn't work; it made me mean"  :  Family History  Problem Relation Age of Onset  .  Colon cancer Brother   :  History   Social History  . Marital Status:  widowed since 66 after being married for 27 years.     Spouse Name: N/A    Number of Children: N/A  . 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 Smoking status: Never Smoker   . Smokeless tobacco: Not on file  . Alcohol Use: No  . Drug Use: No  . Sexually Active:    Other Topics Concern  . Not on file   Social History Narrative  .  both parents deceased at age 24 and 23 respectively, and one brother deceased from colon cancer   :  @Reproductive  History@ G1 P1  Menarche 17   Pertinent items are noted in HPI.  Exam:  @IPVITALS @ General appearance: alert, cooperative and appears stated age Eyes: conjunctivae/corneas clear. PERRL, EOM's intact. Fundi benign. Throat: lips, mucosa, and tongue normal; teeth and gums normal Resp: clear to auscultation bilaterally and normal percussion bilaterally Breasts: normal appearance, no masses or tenderness, Inspection negative, I cannot appreciate any breast masses. Cardio: Soft systolic murmur heard left sternal border GI: soft, non-tender; bowel sounds normal; no masses,  no organomegaly Extremities: extremities normal, atraumatic,  no cyanosis or edema Pulses: 2+ and symmetric Lymph nodes: Cervical, supraclavicular, and axillary nodes normal. Neurologic: Grossly normal   Basename 10/25/11 0855  WBC 7.6  HGB 11.8  HCT 35.9  PLT 302    Basename 10/25/11 0855  NA 135  K 3.8  CL 98  CO2 24  GLUCOSE 119*  BUN 15  CREATININE 0.77  CALCIUM 9.7    Blood smear review: n/a  Pathology:n/a  Mr Breast Bilateral W Wo Contrast  10/24/2011  *RADIOLOGY REPORT*  Clinical Data: Newly-diagnosed left breast 12 o'clock location invasive ductal carcinoma manifesting as a mammographically evident mass  BUN and creatinine  were obtained on site at [facility] at [address] Results:  BUN 16 mg/dL,  Creatinine 0.8 mg/dL.  BILATERAL BREAST MRI WITH AND WITHOUT CONTRAST  Technique: Multiplanar, multisequence MR images of both breasts were obtained prior to and following the intravenous administration of 12ml of Multihance.  Three dimensional images were evaluated at the independent DynaCad workstation.  Comparison:  Prior mammograms at [facility]  Findings: Post biopsy change with clip artifact is noted in the left breast 12 o'clock location.  There is thin and mildly inhomogeneous rim enhancement of the biopsy site demonstrating plateau type enhancement kinetics but no focal abnormal enhancement elsewhere.  No lymphadenopathy or other T2-weighted abnormal hyperintensity. Minimally inhomogeneous fat saturation is noted on postcontrast images, which is not felt to significantly affect the diagnostic quality of the examination.  IMPRESSION: Post biopsy change in the left breast 12 o'clock location at the site of biopsy-proven invasive ductal carcinoma.  No MRI evidence for multifocal/multicentric or contralateral malignancy.  THREE-DIMENSIONAL MR IMAGE RENDERING ON INDEPENDENT WORKSTATION:  Three-dimensional MR images were rendered by post-processing of the original MR data on an independent workstation.  The three- dimensional MR images were interpreted, and findings were reported in the accompanying complete MRI report for this study.  BI-RADS CATEGORY 6:  Known biopsy-proven malignancy - appropriate action should be taken.  Recommendation:  Treatment plan  Original Report Authenticated By: Harrel Lemon, M.D.    Assessment and Plan: Pleasant 78 year old woman with history of ER/PR positive low-grade ductal cancer. She has been seen by radiation oncology as well as surgery. Radiation therapy was not recommended given the small size of the lesion as well as her advanced age. On surgery all see the patient and discussed  adjuvant hormonal therapy with her. She is fairly of a spry and likely: to live 10 years therefore it would be appropriate to offer her adjuvant hormonal therapy. She does have a recent bone density test on record. We will determine vitamin D levels before she returns and discuss her which form of hormonal therapy to provide.  Pierce Crane M.D. FRCP C.   45 minutes was spent with this patient, have to time and patient-related counseling

## 2011-10-30 ENCOUNTER — Encounter: Payer: Self-pay | Admitting: *Deleted

## 2011-10-30 ENCOUNTER — Telehealth: Payer: Self-pay | Admitting: *Deleted

## 2011-10-30 ENCOUNTER — Telehealth: Payer: Self-pay | Admitting: Oncology

## 2011-10-30 NOTE — Telephone Encounter (Signed)
Left vm for pt to return call regarding [facility] from 5/29.

## 2011-10-30 NOTE — Telephone Encounter (Signed)
lmonvm adviisng the pt of her appt with dr Donnie Coffin in july

## 2011-10-31 ENCOUNTER — Telehealth: Payer: Self-pay | Admitting: *Deleted

## 2011-10-31 NOTE — Telephone Encounter (Signed)
Spoke to pt concerning [facility] from 5/29.  Pt denies questions or concerns regarding dx or treatment care plan.  Explained need for needle loc prior to surgery.  Received verbal understanding.  Contact information given.  Encourage pt to call with further needs.

## 2011-11-09 ENCOUNTER — Encounter (HOSPITAL_COMMUNITY): Payer: Self-pay | Admitting: Respiratory Therapy

## 2011-11-09 ENCOUNTER — Telehealth: Payer: Self-pay | Admitting: *Deleted

## 2011-11-09 NOTE — Telephone Encounter (Signed)
Pt called with concerns of surgery and getting wire localization prior to going to [facility].  Pt relate she spoke to Dr. Biagio Quint last night and he explained why that was done.  Pt relate that, that process "doesn't sit well with me", and that she might have surgery at [facility] b/c she doesn't want to go to [facility] then to the surgery center.  Pt relate she would let me know her final decision.

## 2011-11-15 ENCOUNTER — Telehealth (INDEPENDENT_AMBULATORY_CARE_PROVIDER_SITE_OTHER): Payer: Self-pay

## 2011-11-15 NOTE — Telephone Encounter (Signed)
Office notes, diagnostic reports & pathology reports faxed to [facility] to attn: Burnett Harry [phone].  Patient decided to seek treatment for Left Breast Cancer at [facility].

## 2011-11-16 ENCOUNTER — Inpatient Hospital Stay (HOSPITAL_COMMUNITY): Admission: RE | Admit: 2011-11-16 | Payer: Medicare Other | Source: Ambulatory Visit

## 2011-11-21 ENCOUNTER — Encounter (HOSPITAL_COMMUNITY): Admission: RE | Payer: Self-pay | Source: Ambulatory Visit

## 2011-11-21 ENCOUNTER — Ambulatory Visit (HOSPITAL_COMMUNITY): Admission: RE | Admit: 2011-11-21 | Payer: Medicare Other | Source: Ambulatory Visit | Admitting: General Surgery

## 2011-11-21 SURGERY — BREAST LUMPECTOMY WITH NEEDLE LOCALIZATION
Anesthesia: Choice | Laterality: Left

## 2011-12-08 ENCOUNTER — Ambulatory Visit: Payer: Medicare Other | Admitting: Oncology

## 2012-10-23 ENCOUNTER — Encounter (INDEPENDENT_AMBULATORY_CARE_PROVIDER_SITE_OTHER): Payer: Self-pay | Admitting: Ophthalmology

## 2014-06-09 DIAGNOSIS — M47817 Spondylosis without myelopathy or radiculopathy, lumbosacral region: Secondary | ICD-10-CM | POA: Diagnosis not present

## 2014-06-09 DIAGNOSIS — H3530 Unspecified macular degeneration: Secondary | ICD-10-CM | POA: Diagnosis not present

## 2014-06-09 DIAGNOSIS — M5116 Intervertebral disc disorders with radiculopathy, lumbar region: Secondary | ICD-10-CM | POA: Diagnosis not present

## 2014-06-09 DIAGNOSIS — M4806 Spinal stenosis, lumbar region: Secondary | ICD-10-CM | POA: Diagnosis not present

## 2014-06-09 DIAGNOSIS — M419 Scoliosis, unspecified: Secondary | ICD-10-CM | POA: Diagnosis not present

## 2014-06-09 DIAGNOSIS — D3131 Benign neoplasm of right choroid: Secondary | ICD-10-CM | POA: Diagnosis not present

## 2014-06-16 DIAGNOSIS — M419 Scoliosis, unspecified: Secondary | ICD-10-CM | POA: Diagnosis not present

## 2014-06-16 DIAGNOSIS — M47817 Spondylosis without myelopathy or radiculopathy, lumbosacral region: Secondary | ICD-10-CM | POA: Diagnosis not present

## 2014-06-16 DIAGNOSIS — M4806 Spinal stenosis, lumbar region: Secondary | ICD-10-CM | POA: Diagnosis not present

## 2014-06-16 DIAGNOSIS — M5116 Intervertebral disc disorders with radiculopathy, lumbar region: Secondary | ICD-10-CM | POA: Diagnosis not present

## 2014-06-23 DIAGNOSIS — H409 Unspecified glaucoma: Secondary | ICD-10-CM | POA: Diagnosis not present

## 2014-06-23 DIAGNOSIS — H5200 Hypermetropia, unspecified eye: Secondary | ICD-10-CM | POA: Diagnosis not present

## 2014-06-23 DIAGNOSIS — H4011X3 Primary open-angle glaucoma, severe stage: Secondary | ICD-10-CM | POA: Diagnosis not present

## 2014-07-07 DIAGNOSIS — M4806 Spinal stenosis, lumbar region: Secondary | ICD-10-CM | POA: Diagnosis not present

## 2014-07-07 DIAGNOSIS — M47817 Spondylosis without myelopathy or radiculopathy, lumbosacral region: Secondary | ICD-10-CM | POA: Diagnosis not present

## 2014-07-07 DIAGNOSIS — M5116 Intervertebral disc disorders with radiculopathy, lumbar region: Secondary | ICD-10-CM | POA: Diagnosis not present

## 2014-07-07 DIAGNOSIS — M419 Scoliosis, unspecified: Secondary | ICD-10-CM | POA: Diagnosis not present

## 2014-07-20 DIAGNOSIS — M47817 Spondylosis without myelopathy or radiculopathy, lumbosacral region: Secondary | ICD-10-CM | POA: Diagnosis not present

## 2014-07-20 DIAGNOSIS — M5116 Intervertebral disc disorders with radiculopathy, lumbar region: Secondary | ICD-10-CM | POA: Diagnosis not present

## 2014-07-20 DIAGNOSIS — M4806 Spinal stenosis, lumbar region: Secondary | ICD-10-CM | POA: Diagnosis not present

## 2014-08-19 DIAGNOSIS — M545 Low back pain: Secondary | ICD-10-CM | POA: Diagnosis not present

## 2014-08-19 DIAGNOSIS — R0789 Other chest pain: Secondary | ICD-10-CM | POA: Diagnosis not present

## 2014-08-19 DIAGNOSIS — Z6823 Body mass index (BMI) 23.0-23.9, adult: Secondary | ICD-10-CM | POA: Diagnosis not present

## 2014-08-19 DIAGNOSIS — I10 Essential (primary) hypertension: Secondary | ICD-10-CM | POA: Diagnosis not present

## 2014-09-18 DIAGNOSIS — E780 Pure hypercholesterolemia: Secondary | ICD-10-CM | POA: Diagnosis not present

## 2014-09-18 DIAGNOSIS — R079 Chest pain, unspecified: Secondary | ICD-10-CM | POA: Diagnosis not present

## 2014-09-18 DIAGNOSIS — R0602 Shortness of breath: Secondary | ICD-10-CM | POA: Diagnosis not present

## 2014-09-18 DIAGNOSIS — I10 Essential (primary) hypertension: Secondary | ICD-10-CM | POA: Diagnosis not present

## 2014-09-21 DIAGNOSIS — Z6823 Body mass index (BMI) 23.0-23.9, adult: Secondary | ICD-10-CM | POA: Diagnosis not present

## 2014-09-21 DIAGNOSIS — I10 Essential (primary) hypertension: Secondary | ICD-10-CM | POA: Diagnosis not present

## 2014-09-21 DIAGNOSIS — M545 Low back pain: Secondary | ICD-10-CM | POA: Diagnosis not present

## 2014-09-21 DIAGNOSIS — C50912 Malignant neoplasm of unspecified site of left female breast: Secondary | ICD-10-CM | POA: Diagnosis not present

## 2014-09-21 DIAGNOSIS — R0789 Other chest pain: Secondary | ICD-10-CM | POA: Diagnosis not present

## 2014-09-25 DIAGNOSIS — R0789 Other chest pain: Secondary | ICD-10-CM | POA: Diagnosis not present

## 2014-10-01 DIAGNOSIS — R0602 Shortness of breath: Secondary | ICD-10-CM | POA: Diagnosis not present

## 2014-10-09 DIAGNOSIS — R0602 Shortness of breath: Secondary | ICD-10-CM | POA: Diagnosis not present

## 2014-10-09 DIAGNOSIS — R0789 Other chest pain: Secondary | ICD-10-CM | POA: Diagnosis not present

## 2014-10-09 DIAGNOSIS — I208 Other forms of angina pectoris: Secondary | ICD-10-CM | POA: Diagnosis not present

## 2014-10-09 DIAGNOSIS — I447 Left bundle-branch block, unspecified: Secondary | ICD-10-CM | POA: Diagnosis not present

## 2014-11-04 DIAGNOSIS — E780 Pure hypercholesterolemia: Secondary | ICD-10-CM | POA: Diagnosis not present

## 2014-11-04 DIAGNOSIS — I10 Essential (primary) hypertension: Secondary | ICD-10-CM | POA: Diagnosis not present

## 2014-11-04 DIAGNOSIS — R9439 Abnormal result of other cardiovascular function study: Secondary | ICD-10-CM | POA: Diagnosis not present

## 2014-11-04 DIAGNOSIS — I208 Other forms of angina pectoris: Secondary | ICD-10-CM | POA: Diagnosis not present

## 2014-11-25 DIAGNOSIS — I208 Other forms of angina pectoris: Secondary | ICD-10-CM | POA: Diagnosis not present

## 2014-11-25 DIAGNOSIS — I447 Left bundle-branch block, unspecified: Secondary | ICD-10-CM | POA: Diagnosis not present

## 2014-11-25 DIAGNOSIS — R0602 Shortness of breath: Secondary | ICD-10-CM | POA: Diagnosis not present

## 2014-11-25 DIAGNOSIS — R9439 Abnormal result of other cardiovascular function study: Secondary | ICD-10-CM | POA: Diagnosis not present

## 2014-12-11 DIAGNOSIS — R0789 Other chest pain: Secondary | ICD-10-CM | POA: Diagnosis not present

## 2014-12-16 DIAGNOSIS — H409 Unspecified glaucoma: Secondary | ICD-10-CM | POA: Diagnosis not present

## 2014-12-16 DIAGNOSIS — H04123 Dry eye syndrome of bilateral lacrimal glands: Secondary | ICD-10-CM | POA: Diagnosis not present

## 2014-12-16 DIAGNOSIS — H4011X3 Primary open-angle glaucoma, severe stage: Secondary | ICD-10-CM | POA: Diagnosis not present

## 2014-12-16 DIAGNOSIS — H3531 Nonexudative age-related macular degeneration: Secondary | ICD-10-CM | POA: Diagnosis not present

## 2014-12-17 ENCOUNTER — Inpatient Hospital Stay (HOSPITAL_COMMUNITY)
Admission: AD | Admit: 2014-12-17 | Discharge: 2014-12-19 | DRG: 247 | Disposition: A | Discharge status: Home / Self Care | Payer: Commercial Managed Care - HMO | Source: Ambulatory Visit | Attending: Cardiology | Admitting: Cardiology

## 2014-12-17 ENCOUNTER — Encounter (HOSPITAL_COMMUNITY): Payer: Self-pay | Admitting: *Deleted

## 2014-12-17 DIAGNOSIS — I129 Hypertensive chronic kidney disease with stage 1 through stage 4 chronic kidney disease, or unspecified chronic kidney disease: Secondary | ICD-10-CM | POA: Diagnosis present

## 2014-12-17 DIAGNOSIS — D508 Other iron deficiency anemias: Secondary | ICD-10-CM | POA: Diagnosis present

## 2014-12-17 DIAGNOSIS — Z7902 Long term (current) use of antithrombotics/antiplatelets: Secondary | ICD-10-CM | POA: Diagnosis not present

## 2014-12-17 DIAGNOSIS — Z853 Personal history of malignant neoplasm of breast: Secondary | ICD-10-CM

## 2014-12-17 DIAGNOSIS — I2584 Coronary atherosclerosis due to calcified coronary lesion: Secondary | ICD-10-CM | POA: Diagnosis present

## 2014-12-17 DIAGNOSIS — Z79899 Other long term (current) drug therapy: Secondary | ICD-10-CM

## 2014-12-17 DIAGNOSIS — E785 Hyperlipidemia, unspecified: Secondary | ICD-10-CM | POA: Diagnosis present

## 2014-12-17 DIAGNOSIS — I209 Angina pectoris, unspecified: Secondary | ICD-10-CM | POA: Diagnosis not present

## 2014-12-17 DIAGNOSIS — H3530 Unspecified macular degeneration: Secondary | ICD-10-CM | POA: Diagnosis present

## 2014-12-17 DIAGNOSIS — I447 Left bundle-branch block, unspecified: Secondary | ICD-10-CM | POA: Diagnosis present

## 2014-12-17 DIAGNOSIS — Z905 Acquired absence of kidney: Secondary | ICD-10-CM | POA: Diagnosis present

## 2014-12-17 DIAGNOSIS — E780 Pure hypercholesterolemia: Secondary | ICD-10-CM | POA: Diagnosis present

## 2014-12-17 DIAGNOSIS — I25119 Atherosclerotic heart disease of native coronary artery with unspecified angina pectoris: Principal | ICD-10-CM | POA: Diagnosis present

## 2014-12-17 DIAGNOSIS — R079 Chest pain, unspecified: Secondary | ICD-10-CM | POA: Diagnosis present

## 2014-12-17 DIAGNOSIS — Z7982 Long term (current) use of aspirin: Secondary | ICD-10-CM

## 2014-12-17 DIAGNOSIS — N183 Chronic kidney disease, stage 3 (moderate): Secondary | ICD-10-CM | POA: Diagnosis present

## 2014-12-17 DIAGNOSIS — Z524 Kidney donor: Secondary | ICD-10-CM | POA: Diagnosis not present

## 2014-12-17 DIAGNOSIS — I2510 Atherosclerotic heart disease of native coronary artery without angina pectoris: Secondary | ICD-10-CM

## 2014-12-17 DIAGNOSIS — I25118 Atherosclerotic heart disease of native coronary artery with other forms of angina pectoris: Secondary | ICD-10-CM | POA: Diagnosis not present

## 2014-12-17 MED ORDER — ISOSORBIDE MONONITRATE ER 60 MG PO TB24
60.0000 mg | ORAL_TABLET | Freq: Every day | ORAL | Status: DC
  Administered 2014-12-17 – 2014-12-19 (×3): 60 mg via ORAL
  Filled 2014-12-17 (×3): qty 1

## 2014-12-17 MED ORDER — SODIUM CHLORIDE 0.9 % IJ SOLN: 3.0000 mL | INTRAMUSCULAR | Status: DC | PRN

## 2014-12-17 MED ORDER — NITROGLYCERIN 0.4 MG SL SUBL: 0.4000 mg | SUBLINGUAL_TABLET | SUBLINGUAL | Status: DC | PRN

## 2014-12-17 MED ORDER — GUAIFENESIN-DM 100-10 MG/5ML PO SYRP
15.0000 mL | ORAL_SOLUTION | ORAL | Status: DC | PRN
  Administered 2014-12-18 – 2014-12-19 (×2): 15 mL via ORAL
  Filled 2014-12-17 (×2): qty 15

## 2014-12-17 MED ORDER — ASPIRIN 81 MG PO CHEW
81.0000 mg | CHEWABLE_TABLET | ORAL | Status: AC
  Administered 2014-12-18: 81 mg via ORAL
  Filled 2014-12-17: qty 1

## 2014-12-17 MED ORDER — AMLODIPINE BESYLATE 5 MG PO TABS
5.0000 mg | ORAL_TABLET | Freq: Every day | ORAL | Status: DC
  Administered 2014-12-17 – 2014-12-19 (×3): 5 mg via ORAL
  Filled 2014-12-17 (×3): qty 1

## 2014-12-17 MED ORDER — METOPROLOL SUCCINATE ER 25 MG PO TB24
25.0000 mg | ORAL_TABLET | Freq: Every day | ORAL | Status: DC
  Administered 2014-12-18 – 2014-12-19 (×2): 25 mg via ORAL
  Filled 2014-12-17 (×2): qty 1

## 2014-12-17 MED ORDER — LOPERAMIDE HCL 2 MG PO CAPS: 2.0000 mg | ORAL_CAPSULE | ORAL | Status: DC | PRN

## 2014-12-17 MED ORDER — MAGNESIUM HYDROXIDE 400 MG/5ML PO SUSP
30.0000 mL | Freq: Every day | ORAL | Status: DC | PRN
  Filled 2014-12-17: qty 30

## 2014-12-17 MED ORDER — HYDROCORTISONE 1 % EX CREA
1.0000 "application " | TOPICAL_CREAM | Freq: Three times a day (TID) | CUTANEOUS | Status: DC | PRN
  Filled 2014-12-17: qty 28

## 2014-12-17 MED ORDER — DORZOLAMIDE HCL 2 % OP SOLN
1.0000 [drp] | Freq: Two times a day (BID) | OPHTHALMIC | Status: DC
  Administered 2014-12-17 – 2014-12-19 (×3): 1 [drp] via OPHTHALMIC
  Filled 2014-12-17 (×2): qty 10

## 2014-12-17 MED ORDER — SODIUM CHLORIDE 0.9 % IJ SOLN
3.0000 mL | Freq: Two times a day (BID) | INTRAMUSCULAR | Status: DC
  Administered 2014-12-17: 10 mL via INTRAVENOUS

## 2014-12-17 MED ORDER — SODIUM CHLORIDE 0.9 % IV SOLN: 250.0000 mL | INTRAVENOUS | Status: DC | PRN

## 2014-12-17 MED ORDER — PRAMOXINE-ZINC OXIDE IN MO 1-12.5 % RE OINT
1.0000 "application " | TOPICAL_OINTMENT | Freq: Three times a day (TID) | RECTAL | Status: DC | PRN
  Filled 2014-12-17: qty 28.3

## 2014-12-17 MED ORDER — SODIUM CHLORIDE 0.9 % WEIGHT BASED INFUSION
1.0000 mL/kg/h | INTRAVENOUS | Status: DC
  Administered 2014-12-17: 1 mL/kg/h via INTRAVENOUS

## 2014-12-17 MED ORDER — ALUM & MAG HYDROXIDE-SIMETH 200-200-20 MG/5ML PO SUSP: 30.0000 mL | ORAL | Status: DC | PRN

## 2014-12-17 MED ORDER — ENOXAPARIN SODIUM 40 MG/0.4ML ~~LOC~~ SOLN
40.0000 mg | SUBCUTANEOUS | Status: DC
  Filled 2014-12-17 (×3): qty 0.4

## 2014-12-17 MED ORDER — ACETAMINOPHEN 325 MG PO TABS
650.0000 mg | ORAL_TABLET | Freq: Four times a day (QID) | ORAL | Status: DC | PRN
  Administered 2014-12-17 – 2014-12-19 (×2): 650 mg via ORAL
  Filled 2014-12-17 (×2): qty 2

## 2014-12-17 MED ORDER — ALPRAZOLAM 0.25 MG PO TABS
0.2500 mg | ORAL_TABLET | ORAL | Status: DC | PRN
  Administered 2014-12-17 – 2014-12-19 (×3): 0.25 mg via ORAL
  Filled 2014-12-17 (×4): qty 1

## 2014-12-17 MED ORDER — LEVOTHYROXINE SODIUM 100 MCG PO TABS
100.0000 ug | ORAL_TABLET | Freq: Every day | ORAL | Status: DC
  Administered 2014-12-19: 06:00:00 100 ug via ORAL
  Filled 2014-12-17: qty 1

## 2014-12-17 MED ORDER — LATANOPROST 0.005 % OP SOLN
1.0000 [drp] | Freq: Every day | OPHTHALMIC | Status: DC
  Administered 2014-12-17 – 2014-12-18 (×2): 1 [drp] via OPHTHALMIC
  Filled 2014-12-17 (×2): qty 2.5

## 2014-12-17 MED ORDER — ATORVASTATIN CALCIUM 10 MG PO TABS
10.0000 mg | ORAL_TABLET | Freq: Every day | ORAL | Status: DC
  Administered 2014-12-18 – 2014-12-19 (×2): 10 mg via ORAL
  Filled 2014-12-17 (×3): qty 1

## 2014-12-17 NOTE — H&P (Signed)
OFFICE VISIT NOTES COPIED TO EPIC FOR DOCUMENTATION   Candace Ross 11/25/2014 8:12 AM Location: [facility] Patient #: [id number] DOB: 10/09/1926 Widowed / Language: [name] / Race: White Female  History of Present Illness Candace Ross AGNP-C; 11/25/2014 11:39 AM) The patient is a 81 year old female who presents for a follow-up for Chest pain. Candace Ross is an 81 year old fairly active, living independent, Caucasian female with past medical history of hyperlipidemia and hypertension who presented for evaluation of bilateral arm and chest pain at night. She states this is been occurring for about 6 months and happens only nightly. She will develop a tightening in bilateral arms that radiates across her chest and upper back and neck. Symptoms last for several minutes. She has not experienced any symptoms during the day. She also reports mild dyspnea on exertion but does not feel that this is new or worsening.  We she was first seen, I started her on a PPI. Patient continued to have chest pain and has been worsening lasting longer, she had also used sublingual nitroglycerin with relief of chest discomfort. PPI did help some but she still had chest discomfort in spite of being on PPI. She was then started on isosorbide and metoprolol. She underwent stress testing and echocardiogram which revealed a moderate-sized severe septal defect suggestive of scar with very mild peri-infarct ischemia, hypokinesis in the same region, & LVEF estimated at 42%. Discussed cardiac catheterization versus medical therapy and she opted for medical therapy.  She presents today with worsening symptoms at night. She is taking SL Ntg about 2-3 times per week. She denies any PND, orthopnea, dizziness, syncope, or symptoms of congestive claudication or TIA.   Problem List/Past Medical Candace Ross; 11/25/2014 9:59 AM) Chest pain radiating to arm (R07.89) Lexiscan myoview stress test  09/25/2014: 1. The resting electrocardiogram demonstrated normal sinus rhythm, LBBB and no resting arrhythmias. Stress EKG is non-diagnostic for ischemia as it a pharmacologic stress using Lexiscan. Stress symptoms included dyspnea, 2. The perfusion imaging study demonstrates a moderate-sized severe septal defect suggestive of scar with very mild peri-infarct ischemia. Dynamic gated images reveal hypokinesis in the same region, LVEF estimated at 42%. Overall this represents an intermediate risk scan. Clinical correlation is recommended in presence of left under branch block which can cause artifact defect in a patient with no prior history of MI. Shortness of breath on exertion (R06.02) Echo- 10/01/2014 1. Abnormal septal wall motion due to left bundle branch block. Doppler evidence of grade I (impaired) diastolic dysfunction. Moderately depressed systolic function.Calculated EF 40%. 2. Mild calcification of the aortic valve annulus. Mild aortic valve leaflet thickening. 3. Mild mitral regurgitation. Mild calcification of the mitral valve annulus. 4. Mild tricuspid regurgitation. Mild pulmonary hypertension with approx. PA syst. pressure of 34 mm of Hg. Essential hypertension, benign (I10) Labs 04/27/2014: Creatinine 0.8, CMP normal, HB 10.6/HCT 31.6 with normocytic indices Hyperlipidemia, group A (E78.0) Labs 04/27/2014: total cholesterol 253, triglycerides 117, HDL 63, LDL 167, TSH 2.98, vitamin D 34 Left bundle branch block (LBBB) on electrocardiogram (I44.7) Nocturnal angina (I20.8) Abnormal stress test (R94.39) Lexiscan myoview stress test 09/25/2014: 1. The resting electrocardiogram demonstrated normal sinus rhythm, LBBB and no resting arrhythmias. Stress EKG is non-diagnostic for ischemia as it a pharmacologic stress using Lexiscan. Stress symptoms included dyspnea, 2. The perfusion imaging study demonstrates a moderate-sized severe septal defect suggestive of scar with very mild peri-infarct  ischemia. Dynamic gated images reveal hypokinesis in the same region, LVEF estimated at 42%. Overall this  represents an intermediate risk scan. Clinical correlation is recommended in presence of left under branch block which can cause artifact defect in a patient with no prior history of MI. 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 at age 77 from colon cancer. Hx of open heart surgery Father Deceased. at age 72- from Pneumonia. Hx of lung disease Brother 2 1 Deceased 1 living, 42 yrs younger  Social History Candace Ross; 13-Dec-2014 9:59 AM) Current tobacco use Never smoker. Non Drinker/No Alcohol Use Marital status Widowed. Number of Children 1. Living Situation Lives alone.  Past Surgical History Candace Ross; 12/13/14 9:59 AM) BARTHOLIN GLAND CYST [id number] BREAST LUMPECTOMY12/09/1976 Left. Kidney Donor12/21/1988 Donor to her son. hernia repair07/19/2001 Preventative surgery for Stage I Breast Cancer07/31/2013 Left.  Medication History Candace Ross; December 13, 2014 10:07 AM) Metoprolol Succinate ER (25MG Tablet ER 24HR, 1 (one) Tablet ER 24HR Tablet Oral every morning, Taken starting 11/08/2014) Active. Isosorbide Mononitrate ER (60MG Tablet ER 24HR, 1 (one) Tablet ER 24HR Tablet Oral at bedtime, Taken starting 10/09/2014) Active. Atorvastatin Calcium (10MG Tablet, 1 (one) Tablet Tablet Oral daily, Taken starting 10/09/2014) Active. Omeprazole (40MG Capsule DR, 1 (one) Capsule DR Capsule DR Oral 30 minutes before breakfast, Taken starting 10/02/2014) Active. Nitrostat  (0.4MG Tab Sublingual, 1 Sublingual as needed) Active. Nitroglycerin (0.1MG/HR Patch 24HR, 1 patch to chest Transdermal every evening) Discontinued. (Duplicate) Aspirin (99ME Tablet, 1 Oral daily) Active. Levothyroxine Sodium (100MCG Tablet, 1 Oral Mon-Fri) Active. Losartan Potassium (50MG Tablet, 1 Oral daily) Active. AmLODIPine Besylate (5MG Tablet, 1 Oral daily) Active. ICaps AREDS Formula (1 Oral two times daily) Active. Latanoprost (0.005% Solution, 1drop ea eye Ophthalmic at bedtime) Active. Dorzolamide HCl (2% Solution, 1 drop ea eye Ophthalmic two times daily) Active. Calcium 500 + D (500-125MG-UNIT Tablet, 1 Oral daily) Active. Vitamin D3 700iU (Free Text) (1 daily) Active. Magnesium Gluconate (250MG Tablet, 1 Oral daily) Active. Mythl B-12 (Free Text) (1035mg twice monthly) Active. Vitamin E (400UNIT Tablet, 1 Oral twice monthly) Active. Medications Reconciled  Diagnostic Studies History (Candace BrookBeane; [id number] AM) Nuclear stress test04/29/2016 1. The resting electrocardiogram demonstrated normal sinus rhythm, LBBB and no resting arrhythmias. Stress EKG is non-diagnostic for ischemia as it a pharmacologic stress using Lexiscan. Stress symptoms included dyspnea, 2. The perfusion imaging study demonstrates a moderate-sized severe septal defect suggestive of scar with very mild peri-infarct ischemia. Dynamic gated images reveal hypokinesis in the same region, LVEF estimated at 42%. Overall this represents an intermediate risk scan. Clinical correlation is recommended in presence of left under branch block which can cause artifact defect in a patient with no prior history of MI. Echocardiogram05/09/2014 1. Abnormal septal wall motion due to left bundle branch block. Doppler evidence of grade I (impaired) diastolic dysfunction. Moderately depressed systolic function.Calculated EF 40%. 2. Mild calcification of the aortic valve annulus. Mild aortic valve leaflet  thickening. 3. Mild mitral regurgitation. Mild calcification of the mitral valve annulus. 4. Mild tricuspid regurgitation. Mild pulmonary hypertension with approx. PA syst. pressure of 34 mm of Hg.   Review of Systems ([facility] Hail AVirgin[place] 007/17/201611:48 AM) General Not Present- Anorexia, Fatigue and Fever. Respiratory Present- Decreased Exercise Tolerance and Difficulty Breathing on Exertion. Not Present- Cough. Cardiovascular Present- Chest Pain. Not Present- Claudications, Edema, Orthopnea, Paroxysmal Nocturnal  Dyspnea and Shortness of Breath. Gastrointestinal Not Present- Change in Bowel Habits, Constipation and Nausea. Musculoskeletal Present- Back Pain (chronic) and Joint Pain (right hip worse than left). Neurological Not Present- Focal Neurological Symptoms. Endocrine Not Present- Appetite Changes, Cold Intolerance and Heat Intolerance. Hematology Not Present- Anemia, Petechiae and Prolonged Bleeding.   Vitals Candace Ross; 11/25/2014 10:13 AM) 11/25/2014 9:59 AM Weight: 133 lb Height: 58in Body Surface Area: 1.53 m Body Mass Index: 27.8 kg/m  Pulse: 72 (Regular)  P.OX: 93% (Room air) BP: 130/72 (Sitting, Left Arm, Standard)  Physical Exam (Bridgette Ebony Hail, AGNP-C; 11/25/2014 11:48 AM) General Mental Status-Alert. General Appearance-Cooperative, Appears stated age, Not in acute distress. Orientation-Oriented X3. Build & Nutrition-Well nourished and Short statured.  Head and Neck Thyroid Gland Characteristics - no palpable nodules, no palpable enlargement.  Chest and Lung Exam Palpation Tender - No chest wall tenderness. Auscultation Breath sounds - Clear.  Cardiovascular Inspection Jugular vein - Right - No Distention. Auscultation Heart Sounds - S1 WNL, S2 WNL and No gallop present. Murmurs & Other Heart Sounds: Murmur - Location - Aortic Area. Timing - Early systolic. Grade - I/VI.  Abdomen Palpation/Percussion Normal exam - Non  Tender and No hepatosplenomegaly. Auscultation Normal exam - Bowel sounds normal.  Peripheral Vascular Lower Extremity Inspection - Left - No Pigmentation. Right - No Pigmentation. Bilateral - Varicose veins. Palpation - Edema - Left - No edema. Right - No edema. Femoral pulse - Left - Normal. Right - Normal. Popliteal pulse - Left - Normal. Right - Normal. Dorsalis pedis pulse - Left - Normal. Right - Normal. Posterior tibial pulse - Left - Normal. Right - Normal. Carotid arteries - Left-No Carotid bruit. Carotid arteries - Right-No Carotid bruit. Abdomen-No prominent abdominal aortic pulsation, No epigastric bruit.  Neurologic Motor-Grossly intact without any focal deficits.  Musculoskeletal Global Assessment Left Lower Extremity - normal range of motion without pain. Right Lower Extremity - normal range of motion without pain. Spine/Ribs/Pelvis Thoracic (Dorsal) Spine - Deformities/Malalignments/Discrepancies - kyphosis.  Assessment & Plan (Bridgette Ebony Hail AGNP-C; 11/25/2014 11:48 AM) Nocturnal angina (I20.8) Impression: EKG 11/25/2014: Sinus rhythm at a rate of 65 bpm, left bundle branch block. No further analysis due to LBBB. No change from prior EKG. Current Plans Complete electrocardiogram (93000) Shortness of breath on exertion (R06.02) Story: Echo- 10/01/2014 1. Abnormal septal wall motion due to left bundle branch block. Doppler evidence of grade I (impaired) diastolic dysfunction. Moderately depressed systolic function.Calculated EF 40%. 2. Mild calcification of the aortic valve annulus. Mild aortic valve leaflet thickening. 3. Mild mitral regurgitation. Mild calcification of the mitral valve annulus. 4. Mild tricuspid regurgitation. Mild pulmonary hypertension with approx. PA syst. pressure of 34 mm of Hg. Left bundle branch block (LBBB) on electrocardiogram (I44.7) Abnormal stress test (R94.39) Story: Lexiscan myoview stress test 09/25/2014: 1. The resting  electrocardiogram demonstrated normal sinus rhythm, LBBB and no resting arrhythmias. Stress EKG is non-diagnostic for ischemia as it a pharmacologic stress using Lexiscan. Stress symptoms included dyspnea, 2. The perfusion imaging study demonstrates a moderate-sized severe septal defect suggestive of scar with very mild peri-infarct ischemia. Dynamic gated images reveal hypokinesis in the same region, LVEF estimated at 42%. Overall this represents an intermediate risk scan. Clinical correlation is recommended in presence of left under branch block which can cause artifact defect in a patient with no prior history of MI. Essential hypertension, benign (I10) Story: Labs 04/27/2014: Creatinine 0.8, CMP normal, HB 10.6/HCT 31.6 with normocytic indices Hyperlipidemia, group A (E78.0) Story: Labs 04/27/2014: total cholesterol  253, triglycerides 117, HDL 63, LDL 167, TSH 2.98, vitamin D 34 Current Plans Mechanism of underlying disease process and action of medications discussed with the patient. I discussed primary/secondary prevention and also dietary counseling was done. Patient returns for worsening symptoms of nocturnal angina. At her last visit, she had been scheduled for cardiac catheterization, however she decided to cancel this and opted for medical therapy instead. She returns today to discuss coronary angiogram as symptoms have continued to worsen despite compliance with medical therapy. Discussed cardiac catheterization procedure at length and watched video with the patient in exam room in order to ensure that all questions were answered. Patient states that she misunderstood what all was involved with the procedure and that it would have to be done at a hospital and would like some more time to think about it. She states she is certain she will schedule procedure as her symptoms are becoming worse and she is not sleeping well at night, however she will call back to schedule. Follow up after cath if she  chooses to proceed or in 2 months as previously scheduled, if not. Addendum Note(Bridgette Allison AGNP-C; 12/14/2014 9:43 AM) Labs 12/11/2014: Serum glucose 120, BUN 13, creatinine 0.80, eGFR 67, potassium 4.3, HB 10.7/HCT 32.1 with microcytic indices, PLT 321, PT 10.0, INR 1.0  Signed by Candace Ross, AGNP-C (11/25/2014 11:49 AM)  I have personally reviewed the patient's record and performed physical exam and agree with the assessment and plan of Ms. Candace Labella, NP-C. I personally discussed the procedure with the patient.  She wants to proceed with coronary angiography as she is having worsening symptoms.  All questions including risk, complications of coronary angiography including but not limited to less than 1% risk of death, stroke, MI, need for urgent CABG, bleeding discussed with the patient and 1-3% risk of renal failure also discussed.  Patient will be admitted previous evening for hydration.  Blood pressure 173/65, pulse 72, temperature 98.6 F (37 C), temperature source Oral, height 4' 10.5" (1.486 m), weight 60.51 kg (133 lb 6.4 oz), SpO2 97 %.   Adrian Prows, MD 12/17/2014, 5:16 PM [facility] [place] Pager: [phone] Office: [phone] If no answer: Cell:  [phone]

## 2014-12-18 ENCOUNTER — Encounter (HOSPITAL_COMMUNITY)
Admission: AD | Disposition: A | Discharge status: Home / Self Care | Payer: Commercial Managed Care - HMO | Source: Ambulatory Visit | Attending: Cardiology

## 2014-12-18 ENCOUNTER — Encounter (HOSPITAL_COMMUNITY): Payer: Self-pay | Admitting: Cardiology

## 2014-12-18 DIAGNOSIS — I25118 Atherosclerotic heart disease of native coronary artery with other forms of angina pectoris: Secondary | ICD-10-CM | POA: Diagnosis not present

## 2014-12-18 DIAGNOSIS — I129 Hypertensive chronic kidney disease with stage 1 through stage 4 chronic kidney disease, or unspecified chronic kidney disease: Secondary | ICD-10-CM | POA: Diagnosis present

## 2014-12-18 DIAGNOSIS — I2584 Coronary atherosclerosis due to calcified coronary lesion: Secondary | ICD-10-CM | POA: Diagnosis present

## 2014-12-18 DIAGNOSIS — Z853 Personal history of malignant neoplasm of breast: Secondary | ICD-10-CM | POA: Diagnosis not present

## 2014-12-18 DIAGNOSIS — I447 Left bundle-branch block, unspecified: Secondary | ICD-10-CM | POA: Diagnosis present

## 2014-12-18 DIAGNOSIS — Z905 Acquired absence of kidney: Secondary | ICD-10-CM | POA: Diagnosis present

## 2014-12-18 DIAGNOSIS — D508 Other iron deficiency anemias: Secondary | ICD-10-CM | POA: Diagnosis present

## 2014-12-18 DIAGNOSIS — E785 Hyperlipidemia, unspecified: Secondary | ICD-10-CM | POA: Diagnosis present

## 2014-12-18 DIAGNOSIS — Z79899 Other long term (current) drug therapy: Secondary | ICD-10-CM | POA: Diagnosis not present

## 2014-12-18 DIAGNOSIS — Z524 Kidney donor: Secondary | ICD-10-CM | POA: Diagnosis not present

## 2014-12-18 DIAGNOSIS — Z7982 Long term (current) use of aspirin: Secondary | ICD-10-CM | POA: Diagnosis not present

## 2014-12-18 DIAGNOSIS — I2510 Atherosclerotic heart disease of native coronary artery without angina pectoris: Secondary | ICD-10-CM

## 2014-12-18 DIAGNOSIS — N183 Chronic kidney disease, stage 3 (moderate): Secondary | ICD-10-CM | POA: Diagnosis present

## 2014-12-18 DIAGNOSIS — E780 Pure hypercholesterolemia: Secondary | ICD-10-CM | POA: Diagnosis present

## 2014-12-18 DIAGNOSIS — Z7902 Long term (current) use of antithrombotics/antiplatelets: Secondary | ICD-10-CM | POA: Diagnosis not present

## 2014-12-18 DIAGNOSIS — I209 Angina pectoris, unspecified: Secondary | ICD-10-CM | POA: Diagnosis not present

## 2014-12-18 DIAGNOSIS — I25119 Atherosclerotic heart disease of native coronary artery with unspecified angina pectoris: Secondary | ICD-10-CM | POA: Diagnosis present

## 2014-12-18 DIAGNOSIS — R079 Chest pain, unspecified: Secondary | ICD-10-CM | POA: Diagnosis present

## 2014-12-18 DIAGNOSIS — H3530 Unspecified macular degeneration: Secondary | ICD-10-CM | POA: Diagnosis present

## 2014-12-18 HISTORY — PX: CARDIAC CATHETERIZATION: SHX172

## 2014-12-18 SURGERY — LEFT HEART CATH AND CORONARY ANGIOGRAPHY

## 2014-12-18 MED ORDER — LIDOCAINE HCL (PF) 1 % IJ SOLN
INTRAMUSCULAR | Status: AC
  Filled 2014-12-18: qty 30

## 2014-12-18 MED ORDER — ONDANSETRON HCL 4 MG/2ML IJ SOLN: 4.0000 mg | Freq: Four times a day (QID) | INTRAMUSCULAR | Status: DC | PRN

## 2014-12-18 MED ORDER — HEPARIN (PORCINE) IN NACL 2-0.9 UNIT/ML-% IJ SOLN
INTRAMUSCULAR | Status: AC
  Filled 2014-12-18: qty 1000

## 2014-12-18 MED ORDER — FENTANYL CITRATE (PF) 100 MCG/2ML IJ SOLN
INTRAMUSCULAR | Status: AC
  Filled 2014-12-18: qty 2

## 2014-12-18 MED ORDER — FENTANYL CITRATE (PF) 100 MCG/2ML IJ SOLN
50.0000 ug | Freq: Once | INTRAMUSCULAR | Status: AC
  Administered 2014-12-18: 25 ug via INTRAVENOUS

## 2014-12-18 MED ORDER — CLOPIDOGREL BISULFATE 300 MG PO TABS
ORAL_TABLET | ORAL | Status: AC
  Filled 2014-12-18: qty 2

## 2014-12-18 MED ORDER — SODIUM CHLORIDE 0.9 % IJ SOLN
3.0000 mL | Freq: Two times a day (BID) | INTRAMUSCULAR | Status: DC
Start: 2014-12-18 — End: 2014-12-19
  Administered 2014-12-18: 22:00:00 3 mL via INTRAVENOUS

## 2014-12-18 MED ORDER — CLOPIDOGREL BISULFATE 300 MG PO TABS
ORAL_TABLET | ORAL | Status: DC | PRN
  Administered 2014-12-18: 600 mg via ORAL

## 2014-12-18 MED ORDER — BIVALIRUDIN BOLUS VIA INFUSION - CUPID
INTRAVENOUS | Status: DC | PRN
  Administered 2014-12-18: 45.3 mg via INTRAVENOUS

## 2014-12-18 MED ORDER — BIVALIRUDIN 250 MG IV SOLR
INTRAVENOUS | Status: AC
  Filled 2014-12-18: qty 250

## 2014-12-18 MED ORDER — SODIUM CHLORIDE 0.9 % WEIGHT BASED INFUSION: 3.0000 mL/kg/h | INTRAVENOUS | Status: AC

## 2014-12-18 MED ORDER — SODIUM CHLORIDE 0.9 % IV SOLN: 250.0000 mL | INTRAVENOUS | Status: DC | PRN

## 2014-12-18 MED ORDER — FENTANYL CITRATE (PF) 100 MCG/2ML IJ SOLN
INTRAMUSCULAR | Status: DC | PRN
  Administered 2014-12-18 (×2): 25 ug via INTRAVENOUS

## 2014-12-18 MED ORDER — CLOPIDOGREL BISULFATE 75 MG PO TABS
75.0000 mg | ORAL_TABLET | Freq: Every day | ORAL | Status: DC
  Administered 2014-12-19: 75 mg via ORAL
  Filled 2014-12-18: qty 1

## 2014-12-18 MED ORDER — NITROGLYCERIN 1 MG/10 ML FOR IR/CATH LAB
INTRA_ARTERIAL | Status: DC | PRN
  Administered 2014-12-18 (×2): 200 ug via INTRACORONARY

## 2014-12-18 MED ORDER — LIDOCAINE HCL (PF) 1 % IJ SOLN
INTRAMUSCULAR | Status: DC | PRN
  Administered 2014-12-18: 20 mL via SUBCUTANEOUS

## 2014-12-18 MED ORDER — NITROGLYCERIN 1 MG/10 ML FOR IR/CATH LAB
INTRA_ARTERIAL | Status: AC
  Filled 2014-12-18: qty 10

## 2014-12-18 MED ORDER — SODIUM CHLORIDE 0.9 % IJ SOLN: 3.0000 mL | INTRAMUSCULAR | Status: DC | PRN

## 2014-12-18 MED ORDER — SODIUM CHLORIDE 0.9 % IV SOLN
250.0000 mg | INTRAVENOUS | Status: DC | PRN
  Administered 2014-12-18: 1.75 mg/kg/h via INTRAVENOUS

## 2014-12-18 SURGICAL SUPPLY — 25 items
BALLN EUPHORA RX 3.0X15 (BALLOONS) ×3
BALLOON EUPHORA RX 3.0X15 (BALLOONS) ×1 IMPLANT
CATH INFINITI 5FR MPB2 (CATHETERS) ×3 IMPLANT
CATH INFINITI 5FR MULTPACK ANG (CATHETERS) ×3 IMPLANT
CATH OPTITORQUE TIG 4.0 5F (CATHETERS) IMPLANT
CATH VISTA GUIDE 6FR 3DRCSH (CATHETERS) ×3 IMPLANT
CATH VISTA GUIDE 6FR AR1 SH (CATHETERS) ×3 IMPLANT
CATH VISTA GUIDE 6FR HSTICK SH (CATHETERS) ×3 IMPLANT
CATH VISTA GUIDE 6FR JR3.5 SH (CATHETERS) ×3 IMPLANT
CATH VISTA GUIDE 6FR XB3 (CATHETERS) ×3 IMPLANT
CATH VISTA GUIDE 6FR XB3 SH (CATHETERS) ×3 IMPLANT
DEVICE RAD COMP TR BAND LRG (VASCULAR PRODUCTS) IMPLANT
GLIDESHEATH SLEND A-KIT 6F 20G (SHEATH) IMPLANT
KIT ENCORE 26 ADVANTAGE (KITS) ×3 IMPLANT
KIT HEART LEFT (KITS) ×3 IMPLANT
PACK CARDIAC CATHETERIZATION (CUSTOM PROCEDURE TRAY) ×3 IMPLANT
SHEATH PINNACLE 5F 10CM (SHEATH) ×3 IMPLANT
SHEATH PINNACLE 6F 10CM (SHEATH) ×3 IMPLANT
STENT RESOLUTE INTEG 2.75X26 (Permanent Stent) ×3 IMPLANT
STENT RESOLUTE INTEG 3.0X38 (Permanent Stent) ×3 IMPLANT
TRANSDUCER W/STOPCOCK (MISCELLANEOUS) ×3 IMPLANT
TUBING CIL FLEX 10 FLL-RA (TUBING) IMPLANT
WIRE COUGAR XT STRL 190CM (WIRE) ×3 IMPLANT
WIRE EMERALD 3MM-J .035X150CM (WIRE) ×3 IMPLANT
WIRE SAFE-T 1.5MM-J .035X260CM (WIRE) IMPLANT

## 2014-12-18 NOTE — Interval H&P Note (Signed)
History and Physical Interval Note:  12/18/2014 7:43 AM  Candace Ross  has presented today for surgery, with the diagnosis of cp  The various methods of treatment have been discussed with the patient and family. After consideration of risks, benefits and other options for treatment, the patient has consented to  Procedure(s): Left Heart Cath and Coronary Angiography (N/A) and possible PCI as a surgical intervention .  The patient's history has been reviewed, patient examined, no change in status, stable for surgery.  I have reviewed the patient's chart and labs.  Questions were answered to the patient's satisfaction.    Ischemic Symptoms? CCS III (Marked limitation of ordinary activity) Anti-ischemic Medical Therapy? Maximal Medical Therapy (2 or more classes of medications) Non-invasive Test Results? Intermediate-risk stress test findings: cardiac mortality 1-3%/year Prior CABG? No Previous CABG   Patient Information:   1-2V CAD, no prox LAD  A (8)  Indication: 17; Score: 8   Patient Information:   CTO of 1 vessel, no other CAD  A (7)  Indication: 27; Score: 7   Patient Information:   1V CAD with prox LAD  A (9)  Indication: 33; Score: 9   Patient Information:   2V-CAD with prox LAD  A (9)  Indication: 39; Score: 9   Patient Information:   3V-CAD without LMCA  A (9)  Indication: 45; Score: 9   Patient Information:   3V-CAD without LMCA With Abnormal LV systolic function  A (9)  Indication: 48; Score: 9   Patient Information:   LMCA-CAD  A (9)  Indication: 49; Score: 9   Patient Information:   2V-CAD with prox LAD PCI  A (7)  Indication: 62; Score: 7   Patient Information:   2V-CAD with prox LAD CABG  A (8)  Indication: 62; Score: 8   Patient Information:   3V-CAD without LMCA With Low CAD burden(i.e., 3 focal stenoses, low SYNTAX score) PCI  A (7)  Indication: 63; Score: 7   Patient Information:   3V-CAD without LMCA With  Low CAD burden(i.e., 3 focal stenoses, low SYNTAX score) CABG  A (9)  Indication: 63; Score: 9   Patient Information:   3V-CAD without LMCA E06c - Intermediate-high CAD burden (i.e., multiple diffuse lesions, presence of CTO, or high SYNTAX score) PCI  U (4)  Indication: 64; Score: 4   Patient Information:   3V-CAD without LMCA E06c - Intermediate-high CAD burden (i.e., multiple diffuse lesions, presence of CTO, or high SYNTAX score) CABG  A (9)  Indication: 64; Score: 9   Patient Information:   LMCA-CAD With Isolated LMCA stenosis  PCI  U (6)  Indication: 65; Score: 6   Patient Information:   LMCA-CAD With Isolated LMCA stenosis  CABG  A (9)  Indication: 65; Score: 9   Patient Information:   LMCA-CAD Additional CAD, low CAD burden (i.e., 1- to 2-vessel additional involvement, low SYNTAX score) PCI  U (5)  Indication: 66; Score: 5   Patient Information:   LMCA-CAD Additional CAD, low CAD burden (i.e., 1- to 2-vessel additional involvement, low SYNTAX score) CABG  A (9)  Indication: 66; Score: 9   Patient Information:   LMCA-CAD Additional CAD, intermediate-high CAD burden (i.e., 3-vessel involvement, presence of CTO, or high SYNTAX score) PCI  I (3)  Indication: 67; Score: 3   Patient Information:   LMCA-CAD Additional CAD, intermediate-high CAD burden (i.e., 3-vessel involvement, presence of CTO, or high SYNTAX score) CABG  A (9)  Indication: 67; Score: 9  Adrian Prows

## 2014-12-18 NOTE — Progress Notes (Signed)
On two occasions this afternoon, staff was called to the room by family because the patient was "throwing up".  On each occasion the patient was observed with her finger down her throat.  She explained that food sometimes get stuck, and she can't get it down, so she brings it back up.

## 2014-12-18 NOTE — Progress Notes (Signed)
Site area: right groin  Site Prior to Removal:  Level 0  Pressure Applied For 20 MINUTES    Minutes Beginning at 1230  Manual:   Yes.    Patient Status During Pull:  See comment  Post Pull Groin Site:  Level 0  Post Pull Instructions Given:  Yes.    Post Pull Pulses Present:  Yes.    Dressing Applied:  Yes.    Comments:  Upon initiation of hold, patient reported intense pain, was writhing And sobbing uncontrollably. Patient given 25 mcg fentanyl IV with good result.

## 2014-12-18 NOTE — Progress Notes (Signed)
Paged Dr. Einar Gip for PRN anxiety and pain medication orders and for Code status. Received Telephone orders and read back.

## 2014-12-19 DIAGNOSIS — I25118 Atherosclerotic heart disease of native coronary artery with other forms of angina pectoris: Secondary | ICD-10-CM | POA: Diagnosis not present

## 2014-12-19 DIAGNOSIS — I209 Angina pectoris, unspecified: Secondary | ICD-10-CM | POA: Diagnosis not present

## 2014-12-19 LAB — CBC
HEMATOCRIT: 29.2 % — AB (ref 36.0–46.0)
Hemoglobin: 9.5 g/dL — ABNORMAL LOW (ref 12.0–15.0)
MCH: 25.9 pg — ABNORMAL LOW (ref 26.0–34.0)
MCHC: 32.5 g/dL (ref 30.0–36.0)
MCV: 79.6 fL (ref 78.0–100.0)
PLATELETS: 231 10*3/uL (ref 150–400)
RBC: 3.67 MIL/uL — ABNORMAL LOW (ref 3.87–5.11)
RDW: 15.9 % — AB (ref 11.5–15.5)
WBC: 8.1 10*3/uL (ref 4.0–10.5)

## 2014-12-19 LAB — BASIC METABOLIC PANEL
Anion gap: 7 (ref 5–15)
BUN: 14 mg/dL (ref 6–20)
CHLORIDE: 108 mmol/L (ref 101–111)
CO2: 21 mmol/L — ABNORMAL LOW (ref 22–32)
CREATININE: 0.92 mg/dL (ref 0.44–1.00)
Calcium: 8.6 mg/dL — ABNORMAL LOW (ref 8.9–10.3)
GFR calc Af Amer: >60 mL/min (ref >60)
GFR, EST NON AFRICAN AMERICAN: 55 mL/min — AB (ref >60)
Glucose, Bld: 95 mg/dL (ref 65–99)
POTASSIUM: 3.9 mmol/L (ref 3.5–5.1)
Sodium: 136 mmol/L (ref 135–145)

## 2014-12-19 LAB — GLUCOSE, CAPILLARY: Glucose-Capillary: 95 mg/dL (ref 65–99)

## 2014-12-19 MED ORDER — TRAMADOL HCL 50 MG PO TABS: 50.0000 mg | ORAL_TABLET | Freq: Four times a day (QID) | ORAL | Status: DC | PRN

## 2014-12-19 MED ORDER — CLOPIDOGREL BISULFATE 75 MG PO TABS: 75.0000 mg | ORAL_TABLET | Freq: Every day | ORAL | Status: DC

## 2014-12-19 MED ORDER — FENTANYL CITRATE (PF) 100 MCG/2ML IJ SOLN
INTRAMUSCULAR | Status: AC
  Administered 2014-12-19: 50 ug via INTRAVENOUS
  Filled 2014-12-19: qty 2

## 2014-12-19 MED ORDER — FENTANYL CITRATE (PF) 100 MCG/2ML IJ SOLN
50.0000 ug | INTRAMUSCULAR | Status: DC | PRN
  Administered 2014-12-19 (×2): 50 ug via INTRAVENOUS
  Filled 2014-12-19: qty 2

## 2014-12-19 NOTE — Progress Notes (Signed)
Pt complaining of pain from right groin cath insertion site, upon inspection area below cath site bruised, swollen, and hard.  Pressure held on area for 30 minutes, Dr. Einar Gip informed, and new orders received.  Area soft after pressure held.  Will continue to monitor patient, and right femoral cath site.

## 2014-12-19 NOTE — Progress Notes (Signed)
Pt complaining of pain at cath insertion site.  Area surrounding cath insertion site hard, bruised.  Pressure held for 20 minutes, pain medicine given, and Dr. Einar Gip infomed.  No new orders received, will continue to monitor patient and groin site.

## 2014-12-19 NOTE — Progress Notes (Signed)
CARDIAC REHAB PHASE I   PRE:  Rate/Rhythm: 80 SR  BP:  Supine:   Sitting: 114/40  Standing:    SaO2:   MODE:  Ambulation: 20 ft   POST:  Rate/Rhythm: 89 SR  BP:  Supine: 98/29  Sitting:   Standing:    SaO2:  1000-1110 On arrival pt in bathroom removing her clothing and monitor.Placed monitor back on patient. Assisted pt to ambulate to chair and then to walk in hall using her cane. In hallway pt became dizzy, pale unsteady gait. I had to use walker for pt to continue to try to walk. She was only able 20 feet in hallway, pt c/o of feeling like that she was going to pass out. Placed pt in bed on return to room, BP 98/29. Reported to RN. Completed stent education with pt. She voices understanding, but is very tired and was falling asleep.  Rodney Langton RN 12/19/2014 10:59 AM

## 2014-12-19 NOTE — Discharge Summary (Signed)
Physician Discharge Summary  Patient ID: Candace Ross MRN: [id number] DOB/AGE: 79/19/1928 79 y.o.  Admit date: 12/17/2014 Discharge date: 12/19/2014  Primary Discharge Diagnosis 1. Coronary artery disease involving the native vessels, successful PTCA and stenting of the mid right coronary artery and proximal and mid circumflex coronary artery. Stenting of the proximal and mid circumflex with a 3.0 x 38 mm resolute integrity DES for 90% stenosis, stenting of the mid RCA with a 2.75 x 26 mm resolute integrity DES for 99% stenosis, both reduced to 0%. 2. Angina pectoris  Secondary Discharge Diagnosis 3. Hypertension 4. Hyperlipidemia 5. History of unilateral kidney, status post right nephrectomy for questionable lymphoma in the remote past with mild chronic stage II to 3 chronic kidney disease. Stable post angiography. 6. Chronic iron deficiency anemia, stable  Significant Diagnostic Studies: Coronary angiogram 12/19/2014:  RPDA lesion, 70% stenosed.  PTCA and stenting of the proximal and mid circumflex with a 3.0 x 38 mm resolute integrity DES for 90% stenosis, stenting of the mid RCA with a 2.75 x 26 mm resolute integrity DES for 99% stenosis, both reduced to 0%.  LM lesion, 10% stenosed.  LVEF 45% with mid to distal anterolateral akinesis, inferolateral mild hypokinesis  Hospital Course: Patient admitted to the hospital 1 day prior to coronary angiography on elective basis due to history of unilateral kidney, after hydration, she underwent coronary angiography the following morning. Complex procedure but successful without any complications. She had developed a very small insignificant hematoma following angiography after sheath was removed. The day of discharge, patient felt well except had mild tenderness in the right groin. There was no evidence of pseudoaneurysm by physical exam. Patient ambulated in the hallway with the help of cardiac rehabilitation. She'll be discharged home with  outpatient follow-up.  Recommendations on discharge: Patient stable from cardiac standpoint, losartan has been held due to contrast exposure and history of mild stage II to 3 chronic kidney disease. I will recheck her BMP in 1 week to 10 days and restart losartan at that time. She was advised to continue aspirin 81 mg along with Plavix 75 mg by mouth daily. She does have chronic mild anemia with microcytic indicis. May need iron supplements.  Discharge Exam: Blood pressure 117/52, pulse 80, temperature 98.6 F (37 C), temperature source Oral, resp. rate 20, height 4' 10.5" (1.486 m), weight 62.7 kg (138 lb 3.7 oz), SpO2 94 %.    General appearance: alert, cooperative, appears stated age and no distress Resp: clear to auscultation bilaterally Cardio: regular rate and rhythm, S1, S2 normal, no murmur, click, rub or gallop GI: soft, non-tender; bowel sounds normal; no masses,  no organomegaly Extremities: extremities normal, atraumatic, no cyanosis or edema and Right arterial access groin site has very mild ecchymosis without hematoma, no bruit. Excellent lower except he arterial pulses bilaterally. Neurologic: Grossly normal Labs:   Lab Results  Component Value Date   WBC 8.1 12/19/2014   HGB 9.5* 12/19/2014   HCT 29.2* 12/19/2014   MCV 79.6 12/19/2014   PLT 231 12/19/2014    Recent Labs Lab 12/19/14 0240  NA 136  K 3.9  CL 108  CO2 21*  BUN 14  CREATININE 0.92  CALCIUM 8.6*  GLUCOSE 95    EKG: unchanged from previous tracings, LBBB, no further analysis.   FOLLOW UP PLANS AND APPOINTMENTS    Medication List    STOP taking these medications        losartan 50 MG tablet  Commonly known as:  COZAAR      TAKE these medications        amLODipine 5 MG tablet  Commonly known as:  NORVASC  Take 5 mg by mouth daily.     aspirin 81 MG chewable tablet  Chew 81 mg by mouth daily.     atorvastatin 10 MG tablet  Commonly known as:  LIPITOR  Take 10 mg by mouth daily.      calcium-vitamin D 250-100 MG-UNIT per tablet  Take 1 tablet by mouth 2 (two) times daily.     clopidogrel 75 MG tablet  Commonly known as:  PLAVIX  Take 1 tablet (75 mg total) by mouth daily with breakfast.     dorzolamide 2 % ophthalmic solution  Commonly known as:  TRUSOPT  Place 1 drop into both eyes 2 (two) times daily.     isosorbide mononitrate 60 MG 24 hr tablet  Commonly known as:  IMDUR  Take 60 mg by mouth daily.     latanoprost 0.005 % ophthalmic solution  Commonly known as:  XALATAN  Place 1 drop into both eyes at bedtime.     levothyroxine 100 MCG tablet  Commonly known as:  SYNTHROID, LEVOTHROID  Take 100 mcg by mouth daily. Except on Saturday and Sunday     metoprolol succinate 25 MG 24 hr tablet  Commonly known as:  TOPROL-XL  Take 25 mg by mouth daily.     nitroGLYCERIN 0.4 MG SL tablet  Commonly known as:  NITROSTAT  Place 0.4 mg under the tongue every 5 (five) minutes as needed for chest pain.     PRESERVISION AREDS PO  Take 1 capsule by mouth 2 (two) times daily.     traMADol 50 MG tablet  Commonly known as:  ULTRAM  Take 1 tablet (50 mg total) by mouth every 6 (six) hours as needed for moderate pain (Right groin pain).           Follow-up Information    Follow up with Adrian Prows, MD.   Specialty:  Cardiology   Why:  Keep previous appointment   Contact information:   [address] [place] [address] [phone]      Adrian Prows, MD 12/19/2014, 10:07 AM  Pager: [phone] Office: [phone] If no answer: [phone]

## 2014-12-20 LAB — POCT ACTIVATED CLOTTING TIME: ACTIVATED CLOTTING TIME: 343 s

## 2014-12-21 MED FILL — Heparin Sodium (Porcine) 2 Unit/ML in Sodium Chloride 0.9%: INTRAMUSCULAR | Qty: 1000 | Status: AC

## 2014-12-23 DIAGNOSIS — T148 Other injury of unspecified body region: Secondary | ICD-10-CM | POA: Diagnosis not present

## 2014-12-28 DIAGNOSIS — I2510 Atherosclerotic heart disease of native coronary artery without angina pectoris: Secondary | ICD-10-CM | POA: Diagnosis not present

## 2014-12-28 DIAGNOSIS — I10 Essential (primary) hypertension: Secondary | ICD-10-CM | POA: Diagnosis not present

## 2014-12-28 DIAGNOSIS — I447 Left bundle-branch block, unspecified: Secondary | ICD-10-CM | POA: Diagnosis not present

## 2014-12-28 DIAGNOSIS — R9439 Abnormal result of other cardiovascular function study: Secondary | ICD-10-CM | POA: Diagnosis not present

## 2014-12-28 DIAGNOSIS — R58 Hemorrhage, not elsewhere classified: Secondary | ICD-10-CM | POA: Diagnosis not present

## 2015-01-04 ENCOUNTER — Ambulatory Visit
Admission: RE | Admit: 2015-01-04 | Discharge: 2015-01-04 | Disposition: A | Discharge status: Home / Self Care | Payer: Commercial Managed Care - HMO | Source: Ambulatory Visit | Attending: Cardiology | Admitting: Cardiology

## 2015-01-04 ENCOUNTER — Other Ambulatory Visit: Payer: Self-pay | Admitting: Cardiology

## 2015-01-04 DIAGNOSIS — M7989 Other specified soft tissue disorders: Secondary | ICD-10-CM | POA: Diagnosis not present

## 2015-01-04 DIAGNOSIS — I82401 Acute embolism and thrombosis of unspecified deep veins of right lower extremity: Secondary | ICD-10-CM

## 2015-01-04 DIAGNOSIS — M79661 Pain in right lower leg: Secondary | ICD-10-CM | POA: Diagnosis not present

## 2015-01-19 DIAGNOSIS — I2510 Atherosclerotic heart disease of native coronary artery without angina pectoris: Secondary | ICD-10-CM | POA: Diagnosis not present

## 2015-01-19 DIAGNOSIS — R9439 Abnormal result of other cardiovascular function study: Secondary | ICD-10-CM | POA: Diagnosis not present

## 2015-01-19 DIAGNOSIS — I447 Left bundle-branch block, unspecified: Secondary | ICD-10-CM | POA: Diagnosis not present

## 2015-01-19 DIAGNOSIS — I10 Essential (primary) hypertension: Secondary | ICD-10-CM | POA: Diagnosis not present

## 2015-03-29 DIAGNOSIS — M5416 Radiculopathy, lumbar region: Secondary | ICD-10-CM | POA: Diagnosis not present

## 2015-03-29 DIAGNOSIS — M25551 Pain in right hip: Secondary | ICD-10-CM | POA: Diagnosis not present

## 2015-03-30 DIAGNOSIS — M65341 Trigger finger, right ring finger: Secondary | ICD-10-CM | POA: Diagnosis not present

## 2015-04-20 DIAGNOSIS — I10 Essential (primary) hypertension: Secondary | ICD-10-CM | POA: Diagnosis not present

## 2015-04-20 DIAGNOSIS — R011 Cardiac murmur, unspecified: Secondary | ICD-10-CM | POA: Diagnosis not present

## 2015-04-20 DIAGNOSIS — E7800 Pure hypercholesterolemia, unspecified: Secondary | ICD-10-CM | POA: Diagnosis not present

## 2015-04-20 DIAGNOSIS — I2510 Atherosclerotic heart disease of native coronary artery without angina pectoris: Secondary | ICD-10-CM | POA: Diagnosis not present

## 2015-05-03 DIAGNOSIS — N183 Chronic kidney disease, stage 3 (moderate): Secondary | ICD-10-CM | POA: Diagnosis not present

## 2015-05-03 DIAGNOSIS — E039 Hypothyroidism, unspecified: Secondary | ICD-10-CM | POA: Diagnosis not present

## 2015-05-03 DIAGNOSIS — M810 Age-related osteoporosis without current pathological fracture: Secondary | ICD-10-CM | POA: Diagnosis not present

## 2015-05-06 DIAGNOSIS — N8182 Incompetence or weakening of pubocervical tissue: Secondary | ICD-10-CM | POA: Diagnosis not present

## 2015-05-07 DIAGNOSIS — N8182 Incompetence or weakening of pubocervical tissue: Secondary | ICD-10-CM | POA: Diagnosis not present

## 2015-05-10 DIAGNOSIS — N183 Chronic kidney disease, stage 3 (moderate): Secondary | ICD-10-CM | POA: Diagnosis not present

## 2015-05-10 DIAGNOSIS — I272 Other secondary pulmonary hypertension: Secondary | ICD-10-CM | POA: Diagnosis not present

## 2015-05-10 DIAGNOSIS — I739 Peripheral vascular disease, unspecified: Secondary | ICD-10-CM | POA: Diagnosis not present

## 2015-05-10 DIAGNOSIS — E784 Other hyperlipidemia: Secondary | ICD-10-CM | POA: Diagnosis not present

## 2015-05-10 DIAGNOSIS — Z9861 Coronary angioplasty status: Secondary | ICD-10-CM | POA: Diagnosis not present

## 2015-05-10 DIAGNOSIS — L989 Disorder of the skin and subcutaneous tissue, unspecified: Secondary | ICD-10-CM | POA: Diagnosis not present

## 2015-05-10 DIAGNOSIS — M545 Low back pain: Secondary | ICD-10-CM | POA: Diagnosis not present

## 2015-05-10 DIAGNOSIS — Z0000 Encounter for general adult medical examination without abnormal findings: Secondary | ICD-10-CM | POA: Diagnosis not present

## 2015-05-13 DIAGNOSIS — Z853 Personal history of malignant neoplasm of breast: Secondary | ICD-10-CM | POA: Diagnosis not present

## 2015-05-13 DIAGNOSIS — N8182 Incompetence or weakening of pubocervical tissue: Secondary | ICD-10-CM | POA: Diagnosis not present

## 2015-05-18 DIAGNOSIS — C44629 Squamous cell carcinoma of skin of left upper limb, including shoulder: Secondary | ICD-10-CM | POA: Diagnosis not present

## 2015-05-18 DIAGNOSIS — Z85828 Personal history of other malignant neoplasm of skin: Secondary | ICD-10-CM | POA: Diagnosis not present

## 2015-06-01 DIAGNOSIS — R011 Cardiac murmur, unspecified: Secondary | ICD-10-CM | POA: Diagnosis not present

## 2015-06-18 DIAGNOSIS — H401113 Primary open-angle glaucoma, right eye, severe stage: Secondary | ICD-10-CM | POA: Diagnosis not present

## 2015-06-18 DIAGNOSIS — H401123 Primary open-angle glaucoma, left eye, severe stage: Secondary | ICD-10-CM | POA: Diagnosis not present

## 2015-06-28 DIAGNOSIS — N8182 Incompetence or weakening of pubocervical tissue: Secondary | ICD-10-CM | POA: Diagnosis not present

## 2015-07-15 DIAGNOSIS — E038 Other specified hypothyroidism: Secondary | ICD-10-CM | POA: Diagnosis not present

## 2015-07-15 DIAGNOSIS — C50912 Malignant neoplasm of unspecified site of left female breast: Secondary | ICD-10-CM | POA: Diagnosis not present

## 2015-07-15 DIAGNOSIS — Z9861 Coronary angioplasty status: Secondary | ICD-10-CM | POA: Diagnosis not present

## 2015-07-15 DIAGNOSIS — I10 Essential (primary) hypertension: Secondary | ICD-10-CM | POA: Diagnosis not present

## 2015-07-15 DIAGNOSIS — Z6824 Body mass index (BMI) 24.0-24.9, adult: Secondary | ICD-10-CM | POA: Diagnosis not present

## 2015-07-15 DIAGNOSIS — Z1389 Encounter for screening for other disorder: Secondary | ICD-10-CM | POA: Diagnosis not present

## 2015-08-11 DIAGNOSIS — G5603 Carpal tunnel syndrome, bilateral upper limbs: Secondary | ICD-10-CM | POA: Diagnosis not present

## 2015-08-11 DIAGNOSIS — M180 Bilateral primary osteoarthritis of first carpometacarpal joints: Secondary | ICD-10-CM | POA: Diagnosis not present

## 2015-08-11 DIAGNOSIS — M65341 Trigger finger, right ring finger: Secondary | ICD-10-CM | POA: Diagnosis not present

## 2015-09-08 DIAGNOSIS — N8182 Incompetence or weakening of pubocervical tissue: Secondary | ICD-10-CM | POA: Diagnosis not present

## 2015-09-13 DIAGNOSIS — M65341 Trigger finger, right ring finger: Secondary | ICD-10-CM | POA: Diagnosis not present

## 2015-09-22 DIAGNOSIS — M5416 Radiculopathy, lumbar region: Secondary | ICD-10-CM | POA: Diagnosis not present

## 2015-10-07 ENCOUNTER — Other Ambulatory Visit: Payer: Self-pay | Admitting: Physical Medicine and Rehabilitation

## 2015-10-07 DIAGNOSIS — M545 Low back pain: Secondary | ICD-10-CM

## 2015-10-08 DIAGNOSIS — I2510 Atherosclerotic heart disease of native coronary artery without angina pectoris: Secondary | ICD-10-CM | POA: Diagnosis not present

## 2015-10-08 DIAGNOSIS — E7800 Pure hypercholesterolemia, unspecified: Secondary | ICD-10-CM | POA: Diagnosis not present

## 2015-10-14 ENCOUNTER — Ambulatory Visit
Admission: RE | Admit: 2015-10-14 | Discharge: 2015-10-14 | Disposition: A | Discharge status: Home / Self Care | Payer: Commercial Managed Care - HMO | Source: Ambulatory Visit | Attending: Physical Medicine and Rehabilitation | Admitting: Physical Medicine and Rehabilitation

## 2015-10-14 DIAGNOSIS — M545 Low back pain: Secondary | ICD-10-CM

## 2015-10-14 DIAGNOSIS — M47816 Spondylosis without myelopathy or radiculopathy, lumbar region: Secondary | ICD-10-CM | POA: Diagnosis not present

## 2015-10-19 DIAGNOSIS — M5416 Radiculopathy, lumbar region: Secondary | ICD-10-CM | POA: Diagnosis not present

## 2015-10-19 DIAGNOSIS — I10 Essential (primary) hypertension: Secondary | ICD-10-CM | POA: Diagnosis not present

## 2015-10-19 DIAGNOSIS — I2510 Atherosclerotic heart disease of native coronary artery without angina pectoris: Secondary | ICD-10-CM | POA: Diagnosis not present

## 2015-10-19 DIAGNOSIS — M810 Age-related osteoporosis without current pathological fracture: Secondary | ICD-10-CM | POA: Diagnosis not present

## 2015-10-19 DIAGNOSIS — R011 Cardiac murmur, unspecified: Secondary | ICD-10-CM | POA: Diagnosis not present

## 2015-10-19 DIAGNOSIS — E7800 Pure hypercholesterolemia, unspecified: Secondary | ICD-10-CM | POA: Diagnosis not present

## 2015-12-21 DIAGNOSIS — I10 Essential (primary) hypertension: Secondary | ICD-10-CM | POA: Diagnosis not present

## 2016-01-10 DIAGNOSIS — M5416 Radiculopathy, lumbar region: Secondary | ICD-10-CM | POA: Diagnosis not present

## 2016-01-15 ENCOUNTER — Encounter (INDEPENDENT_AMBULATORY_CARE_PROVIDER_SITE_OTHER): Payer: Self-pay

## 2016-01-21 ENCOUNTER — Observation Stay (HOSPITAL_COMMUNITY)
Admission: EM | Admit: 2016-01-21 | Discharge: 2016-01-23 | Disposition: A | Discharge status: Home / Self Care | Payer: Commercial Managed Care - HMO | Attending: Cardiology | Admitting: Cardiology

## 2016-01-21 ENCOUNTER — Emergency Department (HOSPITAL_COMMUNITY): Payer: Commercial Managed Care - HMO

## 2016-01-21 ENCOUNTER — Encounter (HOSPITAL_COMMUNITY): Payer: Self-pay

## 2016-01-21 DIAGNOSIS — N183 Chronic kidney disease, stage 3 (moderate): Secondary | ICD-10-CM | POA: Diagnosis not present

## 2016-01-21 DIAGNOSIS — M79602 Pain in left arm: Secondary | ICD-10-CM

## 2016-01-21 DIAGNOSIS — I25110 Atherosclerotic heart disease of native coronary artery with unstable angina pectoris: Secondary | ICD-10-CM | POA: Diagnosis not present

## 2016-01-21 DIAGNOSIS — I447 Left bundle-branch block, unspecified: Secondary | ICD-10-CM | POA: Diagnosis not present

## 2016-01-21 DIAGNOSIS — R0789 Other chest pain: Secondary | ICD-10-CM | POA: Diagnosis not present

## 2016-01-21 DIAGNOSIS — Z853 Personal history of malignant neoplasm of breast: Secondary | ICD-10-CM | POA: Diagnosis not present

## 2016-01-21 DIAGNOSIS — I10 Essential (primary) hypertension: Secondary | ICD-10-CM | POA: Diagnosis not present

## 2016-01-21 DIAGNOSIS — R002 Palpitations: Secondary | ICD-10-CM | POA: Diagnosis not present

## 2016-01-21 DIAGNOSIS — I129 Hypertensive chronic kidney disease with stage 1 through stage 4 chronic kidney disease, or unspecified chronic kidney disease: Secondary | ICD-10-CM | POA: Insufficient documentation

## 2016-01-21 DIAGNOSIS — Z905 Acquired absence of kidney: Secondary | ICD-10-CM | POA: Diagnosis not present

## 2016-01-21 DIAGNOSIS — M25512 Pain in left shoulder: Secondary | ICD-10-CM | POA: Diagnosis not present

## 2016-01-21 DIAGNOSIS — C50119 Malignant neoplasm of central portion of unspecified female breast: Secondary | ICD-10-CM

## 2016-01-21 DIAGNOSIS — M810 Age-related osteoporosis without current pathological fracture: Secondary | ICD-10-CM | POA: Diagnosis not present

## 2016-01-21 DIAGNOSIS — E785 Hyperlipidemia, unspecified: Secondary | ICD-10-CM | POA: Insufficient documentation

## 2016-01-21 DIAGNOSIS — D539 Nutritional anemia, unspecified: Secondary | ICD-10-CM | POA: Insufficient documentation

## 2016-01-21 DIAGNOSIS — E079 Disorder of thyroid, unspecified: Secondary | ICD-10-CM | POA: Insufficient documentation

## 2016-01-21 DIAGNOSIS — Z7982 Long term (current) use of aspirin: Secondary | ICD-10-CM | POA: Insufficient documentation

## 2016-01-21 DIAGNOSIS — I209 Angina pectoris, unspecified: Secondary | ICD-10-CM

## 2016-01-21 DIAGNOSIS — I200 Unstable angina: Secondary | ICD-10-CM | POA: Diagnosis present

## 2016-01-21 DIAGNOSIS — Z955 Presence of coronary angioplasty implant and graft: Secondary | ICD-10-CM | POA: Diagnosis not present

## 2016-01-21 DIAGNOSIS — I25118 Atherosclerotic heart disease of native coronary artery with other forms of angina pectoris: Secondary | ICD-10-CM | POA: Diagnosis not present

## 2016-01-21 LAB — CBC
HCT: 37.6 % (ref 36.0–46.0)
HEMATOCRIT: 33.5 % — AB (ref 36.0–46.0)
Hemoglobin: 10.2 g/dL — ABNORMAL LOW (ref 12.0–15.0)
Hemoglobin: 11.6 g/dL — ABNORMAL LOW (ref 12.0–15.0)
MCH: 25.1 pg — AB (ref 26.0–34.0)
MCH: 25.8 pg — AB (ref 26.0–34.0)
MCHC: 30.4 g/dL (ref 30.0–36.0)
MCHC: 30.9 g/dL (ref 30.0–36.0)
MCV: 82.5 fL (ref 78.0–100.0)
MCV: 83.7 fL (ref 78.0–100.0)
PLATELETS: 343 10*3/uL (ref 150–400)
Platelets: 342 10*3/uL (ref 150–400)
RBC: 4.06 MIL/uL (ref 3.87–5.11)
RBC: 4.49 MIL/uL (ref 3.87–5.11)
RDW: 16 % — ABNORMAL HIGH (ref 11.5–15.5)
RDW: 16.1 % — ABNORMAL HIGH (ref 11.5–15.5)
WBC: 8.9 10*3/uL (ref 4.0–10.5)
WBC: 9.8 10*3/uL (ref 4.0–10.5)

## 2016-01-21 LAB — BASIC METABOLIC PANEL
Anion gap: 9 (ref 5–15)
BUN: 14 mg/dL (ref 6–20)
CHLORIDE: 105 mmol/L (ref 101–111)
CO2: 22 mmol/L (ref 22–32)
CREATININE: 0.9 mg/dL (ref 0.44–1.00)
Calcium: 9.5 mg/dL (ref 8.9–10.3)
GFR calc Af Amer: >60 mL/min (ref >60)
GFR calc non Af Amer: 56 mL/min — ABNORMAL LOW (ref >60)
Glucose, Bld: 108 mg/dL — ABNORMAL HIGH (ref 65–99)
POTASSIUM: 4 mmol/L (ref 3.5–5.1)
Sodium: 136 mmol/L (ref 135–145)

## 2016-01-21 LAB — I-STAT TROPONIN, ED
Troponin i, poc: 0 ng/mL (ref 0.00–0.08)
Troponin i, poc: 0 ng/mL (ref 0.00–0.08)

## 2016-01-21 LAB — CREATININE, SERUM
Creatinine, Ser: 0.87 mg/dL (ref 0.44–1.00)
GFR calc non Af Amer: 59 mL/min — ABNORMAL LOW (ref >60)

## 2016-01-21 LAB — CBG MONITORING, ED: GLUCOSE-CAPILLARY: 134 mg/dL — AB (ref 65–99)

## 2016-01-21 MED ORDER — ACETAMINOPHEN-CODEINE #3 300-30 MG PO TABS
1.0000 | ORAL_TABLET | Freq: Four times a day (QID) | ORAL | Status: DC | PRN
  Administered 2016-01-22: 1 via ORAL
  Filled 2016-01-21: qty 1

## 2016-01-21 MED ORDER — METOPROLOL SUCCINATE ER 25 MG PO TB24
25.0000 mg | ORAL_TABLET | Freq: Every day | ORAL | Status: DC
  Administered 2016-01-22 – 2016-01-23 (×2): 25 mg via ORAL
  Filled 2016-01-21 (×2): qty 1

## 2016-01-21 MED ORDER — LATANOPROST 0.005 % OP SOLN
1.0000 [drp] | Freq: Every day | OPHTHALMIC | Status: DC
  Administered 2016-01-21 – 2016-01-22 (×2): 1 [drp] via OPHTHALMIC
  Filled 2016-01-21: qty 2.5

## 2016-01-21 MED ORDER — NITROGLYCERIN 0.4 MG SL SUBL
0.4000 mg | SUBLINGUAL_TABLET | SUBLINGUAL | Status: DC | PRN
Start: 2016-01-21 — End: 2016-01-23

## 2016-01-21 MED ORDER — ASPIRIN EC 81 MG PO TBEC
81.0000 mg | DELAYED_RELEASE_TABLET | Freq: Every day | ORAL | Status: DC
  Administered 2016-01-22 – 2016-01-23 (×2): 81 mg via ORAL
  Filled 2016-01-21 (×2): qty 1

## 2016-01-21 MED ORDER — ASPIRIN 300 MG RE SUPP
300.0000 mg | RECTAL | Status: AC
  Filled 2016-01-21: qty 1

## 2016-01-21 MED ORDER — DORZOLAMIDE HCL 2 % OP SOLN
1.0000 [drp] | Freq: Two times a day (BID) | OPHTHALMIC | Status: DC
  Administered 2016-01-21 – 2016-01-23 (×4): 1 [drp] via OPHTHALMIC
  Filled 2016-01-21: qty 10

## 2016-01-21 MED ORDER — CALCIUM CITRATE-VITAMIN D 250-100 MG-UNIT PO TABS: 1.0000 | ORAL_TABLET | Freq: Two times a day (BID) | ORAL | Status: DC

## 2016-01-21 MED ORDER — ASPIRIN 81 MG PO CHEW: 324.0000 mg | CHEWABLE_TABLET | ORAL | Status: AC

## 2016-01-21 MED ORDER — CALCIUM CARBONATE-VITAMIN D 500-200 MG-UNIT PO TABS
0.5000 | ORAL_TABLET | Freq: Two times a day (BID) | ORAL | Status: DC
  Administered 2016-01-21 – 2016-01-23 (×4): 0.5 via ORAL
  Filled 2016-01-21 (×4): qty 1

## 2016-01-21 MED ORDER — ASPIRIN 81 MG PO CHEW
324.0000 mg | CHEWABLE_TABLET | Freq: Once | ORAL | Status: AC
  Administered 2016-01-21: 324 mg via ORAL
  Filled 2016-01-21: qty 4

## 2016-01-21 MED ORDER — ISOSORBIDE MONONITRATE ER 60 MG PO TB24
60.0000 mg | ORAL_TABLET | Freq: Every day | ORAL | Status: DC
  Administered 2016-01-21 – 2016-01-23 (×3): 60 mg via ORAL
  Filled 2016-01-21 (×3): qty 1

## 2016-01-21 MED ORDER — ACETAMINOPHEN 325 MG PO TABS: 650.0000 mg | ORAL_TABLET | ORAL | Status: DC | PRN

## 2016-01-21 MED ORDER — ONDANSETRON HCL 4 MG/2ML IJ SOLN: 4.0000 mg | Freq: Four times a day (QID) | INTRAMUSCULAR | Status: DC | PRN

## 2016-01-21 MED ORDER — CLOPIDOGREL BISULFATE 75 MG PO TABS
75.0000 mg | ORAL_TABLET | Freq: Every day | ORAL | Status: DC
  Administered 2016-01-22 – 2016-01-23 (×2): 75 mg via ORAL
  Filled 2016-01-21 (×2): qty 1

## 2016-01-21 MED ORDER — LEVOTHYROXINE SODIUM 100 MCG PO TABS: 100.0000 ug | ORAL_TABLET | ORAL | Status: DC

## 2016-01-21 MED ORDER — ATORVASTATIN CALCIUM 10 MG PO TABS
10.0000 mg | ORAL_TABLET | Freq: Every day | ORAL | Status: DC
  Administered 2016-01-21 – 2016-01-23 (×2): 10 mg via ORAL
  Filled 2016-01-21 (×4): qty 1

## 2016-01-21 MED ORDER — HEPARIN SODIUM (PORCINE) 5000 UNIT/ML IJ SOLN
5000.0000 [IU] | Freq: Three times a day (TID) | INTRAMUSCULAR | Status: DC
  Administered 2016-01-21 – 2016-01-23 (×5): 5000 [IU] via SUBCUTANEOUS
  Filled 2016-01-21 (×5): qty 1

## 2016-01-21 MED ORDER — AMLODIPINE BESYLATE 5 MG PO TABS
10.0000 mg | ORAL_TABLET | Freq: Every day | ORAL | Status: DC
  Administered 2016-01-21 – 2016-01-22 (×2): 10 mg via ORAL
  Filled 2016-01-21: qty 4
  Filled 2016-01-21 (×2): qty 2

## 2016-01-21 MED ORDER — NITROGLYCERIN 2 % TD OINT
0.5000 [in_us] | TOPICAL_OINTMENT | Freq: Once | TRANSDERMAL | Status: AC
  Administered 2016-01-21: 0.5 [in_us] via TOPICAL
  Filled 2016-01-21: qty 1

## 2016-01-21 NOTE — H&P (Signed)
Candace Ross is an 82 y.o. female.   Chief Complaint: Left arm pain, Angina pectoris HPI: Candace Ross  is a 82 y.o. female  With Lives independantly, Patient had been doing well until last night, started having left arm discomfort that lasted for several hours. She also complains of left shoulder pain and radiation of the pain to the left side of the neck. Symptoms similar to her prior angina pectoris when she had undergone coronary angiography and angioplasty to the circumflex and right coronary artery in July 2016. She does use occasional sublingual nitroglycerin with relief of pain for similar pain. This morning she woke up and due to persistent pain she presented to the emergency room. In the emergency room she was started on nitroglycerin patch, and since then has not had any recurrence of chest pain. She is now being admitted to the hospital for eval of snuff unstable angina pectoris. Initial set of troponin i-STAT negative for myocardial injury and EKG revealing left bundle branch block. Now asymptomatic, her brother at the bedside.  Her other history includes hypertension, hyperlipidemia, has single kidney with right nephrectomy in the remote past with persistent stage III chronic kidney disease.  Past Medical History:  Diagnosis Date  . Anemia   . Back pain   . Breast cancer ([place])   . Change in stool habits   . Dribbling urine   . H/O bone density study 2009  . Hearing loss   . Hypertension   . Osteoporosis   . Sinus problem   . SOB (shortness of breath) on exertion    stairs  . Spinal stenosis   . Thyroid disease   . Wears dentures     Past Surgical History:  Procedure Laterality Date  . BARTHOLIN GLAND CYST EXCISION  1969  . BREAST LUMPECTOMY  05/02/77   benign  . CARDIAC CATHETERIZATION N/A 12/18/2014   Procedure: Left Heart Cath and Coronary Angiography;  Surgeon: Adrian Prows, MD;  Location: [facility];  Service: Cardiovascular;  Laterality: N/A;  . CARDIAC  CATHETERIZATION N/A 12/18/2014   Procedure: Coronary Stent Intervention;  Surgeon: Adrian Prows, MD;  Location: [facility];  Service: Cardiovascular;  Laterality: N/A;  . HERNIA REPAIR  12/15/99    Family History  Problem Relation Age of Onset  . Colon cancer Brother    Social History:  reports that she has never smoked. She has never used smokeless tobacco. She reports that she does not drink alcohol or use drugs.  Allergies:  Allergies  Allergen Reactions  . Other Other (See Comments)    "some medicine they gave me in the hospital, it was a little purple pill, to help me sleep didn't work; it made me mean"    Review of Systems - Negative except left arm pain  Blood pressure 158/57, pulse 73, temperature 98.4 F (36.9 C), temperature source Oral, resp. rate 12, SpO2 97 %. General appearance: alert, cooperative, appears stated age and no distress Eyes: negative findings: lids and lashes normal Neck: no adenopathy, no carotid bruit, no JVD, supple, symmetrical, trachea midline and thyroid not enlarged, symmetric, no tenderness/mass/nodules Neck: JVP - normal, carotids 2+= without bruits Resp: clear to auscultation bilaterally Chest wall: no tenderness Cardio: regular rate and rhythm, S1, S2 normal, no murmur, click, rub or gallop GI: soft, non-tender; bowel sounds normal; no masses,  no organomegaly Extremities: extremities normal, atraumatic, no cyanosis or edema Pulses: All the peripheral pulses are equal except left pedal pulse  minimal he diminished. No carotid bruit. Skin: Skin color, texture, turgor normal. No rashes or lesions Neurologic: Grossly normal  Results for orders placed or performed during the hospital encounter of 01/21/16 (from the past 48 hour(s))  Basic metabolic panel     Status: Abnormal   Collection Time: 01/21/16 11:22 AM  Result Value Ref Range   Sodium 136 135 - 145 mmol/L   Potassium 4.0 3.5 - 5.1 mmol/L   Chloride 105 101 - 111 mmol/L   CO2 22  22 - 32 mmol/L   Glucose, Bld 108 (H) 65 - 99 mg/dL   BUN 14 6 - 20 mg/dL   Creatinine, Ser 0.90 0.44 - 1.00 mg/dL   Calcium 9.5 8.9 - 10.3 mg/dL   GFR calc non Af Amer 56 (L) >60 mL/min   GFR calc Af Amer >60 >60 mL/min    Comment: (NOTE) The eGFR has been calculated using the CKD EPI equation. This calculation has not been validated in all clinical situations. eGFR's persistently <60 mL/min signify possible Chronic Kidney Disease.    Anion gap 9 5 - 15  CBC     Status: Abnormal   Collection Time: 01/21/16 11:22 AM  Result Value Ref Range   WBC 8.9 4.0 - 10.5 K/uL   RBC 4.06 3.87 - 5.11 MIL/uL   Hemoglobin 10.2 (L) 12.0 - 15.0 g/dL   HCT 33.5 (L) 36.0 - 46.0 %   MCV 82.5 78.0 - 100.0 fL   MCH 25.1 (L) 26.0 - 34.0 pg   MCHC 30.4 30.0 - 36.0 g/dL   RDW 16.0 (H) 11.5 - 15.5 %   Platelets 343 150 - 400 K/uL  I-stat troponin, ED     Status: None   Collection Time: 01/21/16 11:40 AM  Result Value Ref Range   Troponin i, poc 0.00 0.00 - 0.08 ng/mL   Comment 3            Comment: Due to the release kinetics of cTnI, a negative result within the first hours of the onset of symptoms does not rule out myocardial infarction with certainty. If myocardial infarction is still suspected, repeat the test at appropriate intervals.   CBG monitoring, ED     Status: Abnormal   Collection Time: 01/21/16  4:14 PM  Result Value Ref Range   Glucose-Capillary 134 (H) 65 - 99 mg/dL  I-stat troponin, ED     Status: None   Collection Time: 01/21/16  4:27 PM  Result Value Ref Range   Troponin i, poc 0.00 0.00 - 0.08 ng/mL   Comment 3            Comment: Due to the release kinetics of cTnI, a negative result within the first hours of the onset of symptoms does not rule out myocardial infarction with certainty. If myocardial infarction is still suspected, repeat the test at appropriate intervals.    Dg Chest 2 View  Result Date: 01/21/2016 CLINICAL DATA:  Palpitations/CP,LEFT ARM PAIN TODAY  EXAM: CHEST  2 VIEW COMPARISON:  10/16/2007 FINDINGS: Cardiac silhouette is mildly enlarged. There is along the left coronary artery stent. No mediastinal or hilar masses or evidence of adenopathy. Moderate size hiatal hernia. Lungs are clear.  No pleural effusion or pneumothorax. Bony thorax is demineralized. There are mild endplate depressions along the lower thoracic spine, stable. There changes from left breast surgery. IMPRESSION: No acute cardiopulmonary disease. Electronically Signed   By: Lajean Manes M.D.   On: 01/21/2016 12:18  Labs:   Lab Results  Component Value Date   WBC 8.9 01/21/2016   HGB 10.2 (L) 01/21/2016   HCT 33.5 (L) 01/21/2016   MCV 82.5 01/21/2016   PLT 343 01/21/2016    Recent Labs Lab 01/21/16 1122  NA 136  K 4.0  CL 105  CO2 22  BUN 14  CREATININE 0.90  CALCIUM 9.5  GLUCOSE 108*    EKG: unchanged from previous tracings, LBBB.   Current Facility-Administered Medications:  .  acetaminophen (TYLENOL) tablet 650 mg, 650 mg, Oral, Q4H PRN, Adrian Prows, MD .  acetaminophen-codeine (TYLENOL #3) 300-30 MG per tablet 1 tablet, 1 tablet, Oral, Q6H PRN, Adrian Prows, MD .  amLODipine (NORVASC) tablet 10 mg, 10 mg, Oral, Daily, Adrian Prows, MD .  aspirin chewable tablet 324 mg, 324 mg, Oral, NOW **OR** aspirin suppository 300 mg, 300 mg, Rectal, NOW, Adrian Prows, MD .  Derrill Memo ON 01/22/2016] aspirin EC tablet 81 mg, 81 mg, Oral, Daily, Adrian Prows, MD .  atorvastatin (LIPITOR) tablet 10 mg, 10 mg, Oral, Daily, Adrian Prows, MD .  calcium-vitamin D 250-100 MG-UNIT per tablet 1 tablet, 1 tablet, Oral, BID, Adrian Prows, MD .  Derrill Memo ON 01/22/2016] clopidogrel (PLAVIX) tablet 75 mg, 75 mg, Oral, Q breakfast, Adrian Prows, MD .  dorzolamide (TRUSOPT) 2 % ophthalmic solution 1 drop, 1 drop, Both Eyes, BID, Adrian Prows, MD .  heparin injection 5,000 Units, 5,000 Units, Subcutaneous, Q8H, Adrian Prows, MD .  isosorbide mononitrate (IMDUR) 24 hr tablet 60 mg, 60 mg, Oral, Daily, Adrian Prows,  MD .  latanoprost (XALATAN) 0.005 % ophthalmic solution 1 drop, 1 drop, Both Eyes, QHS, Adrian Prows, MD .  levothyroxine (SYNTHROID, LEVOTHROID) tablet 100 mcg, 100 mcg, Oral, Daily, Adrian Prows, MD .  metoprolol succinate (TOPROL-XL) 24 hr tablet 25 mg, 25 mg, Oral, Daily, Adrian Prows, MD .  nitroGLYCERIN (NITROSTAT) SL tablet 0.4 mg, 0.4 mg, Sublingual, Q5 Min x 3 PRN, Adrian Prows, MD .  ondansetron (ZOFRAN) injection 4 mg, 4 mg, Intravenous, Q6H PRN, Adrian Prows, MD  Current Outpatient Prescriptions:  .  acetaminophen-codeine (TYLENOL #3) 300-30 MG tablet, Take 1 tablet by mouth every 6 (six) hours as needed for moderate pain. , Disp: , Rfl:  .  amLODipine (NORVASC) 5 MG tablet, Take 5 mg by mouth daily., Disp: , Rfl:  .  aspirin 81 MG chewable tablet, Chew 81 mg by mouth daily., Disp: , Rfl:  .  atorvastatin (LIPITOR) 10 MG tablet, Take 10 mg by mouth daily., Disp: , Rfl:  .  calcium-vitamin D 250-100 MG-UNIT per tablet, Take 1 tablet by mouth 2 (two) times daily., Disp: , Rfl:  .  clopidogrel (PLAVIX) 75 MG tablet, Take 1 tablet (75 mg total) by mouth daily with breakfast., Disp: 30 tablet, Rfl: 11 .  Cyanocobalamin (VITAMIN B 12 PO), Take by mouth., Disp: , Rfl:  .  dorzolamide (TRUSOPT) 2 % ophthalmic solution, Place 1 drop into both eyes 2 (two) times daily., Disp: , Rfl:  .  isosorbide mononitrate (IMDUR) 60 MG 24 hr tablet, Take 60 mg by mouth daily., Disp: , Rfl:  .  latanoprost (XALATAN) 0.005 % ophthalmic solution, Place 1 drop into both eyes at bedtime. , Disp: , Rfl:  .  levothyroxine (SYNTHROID, LEVOTHROID) 100 MCG tablet, Take 100 mcg by mouth daily. Except on Saturday and Sunday, Disp: , Rfl:  .  metoprolol succinate (TOPROL-XL) 25 MG 24 hr tablet, Take 25 mg by mouth daily., Disp: , Rfl:  .  Multiple Vitamins-Minerals (PRESERVISION AREDS PO), Take 1 capsule by mouth 2 (two) times daily., Disp: , Rfl:  .  nitroGLYCERIN (NITROSTAT) 0.4 MG SL tablet, Place 0.4 mg under the tongue every 5  (five) minutes as needed for chest pain., Disp: , Rfl:  .  VITAMIN E PO, Take by mouth., Disp: , Rfl:  .  traMADol (ULTRAM) 50 MG tablet, Take 1 tablet (50 mg total) by mouth every 6 (six) hours as needed for moderate pain (Right groin pain). (Patient not taking: Reported on 01/21/2016), Disp: 30 tablet, Rfl: 0   Assessment/Plan 1. Left arm pain suggestive of her anginal symptoms, symptoms suggestive of unstable angina. 2. Coronary artery disease involving the native vessels,stenting of the mid right coronary artery and proximal and mid circumflex coronary artery with a 3.0 x 38 mm resolute integrity DES, stenting of the mid RCA with a 2.75 x 26 mm resolute integrity DES on 12/17/2014. Has residual 70% PDA stenosis being treated medically. 3. Left bundle branch block, chronic 4. Hypertension 5. Hyperlipidemia 6. Unilateral kidney, history of right nephrectomy in the remote past, with underlying stage III CKD 7. Chronic and deficiency anemia  Recommendation: Patient symptoms are suggestive of angina pectoris, unstable angina. However she is completely asymptomatic.  I'll admit the patient and rule her out for myocardial infarction, unless she has recurrence of chest pain then I'll consider stress testing or repeating angiography. I will increase her amlodipine to 10 mg daily.   Adrian Prows, MD 01/21/2016, 5:50 PM [facility] [place] Pager: [phone] Office: [phone] If no answer: Cell:  [phone]

## 2016-01-21 NOTE — ED Notes (Signed)
Dr. Einar Gip MD at bedside.

## 2016-01-21 NOTE — ED Notes (Signed)
Pt family member came to desk and advised staff pt had taken three nitro while sitting in waiting room.  Advised his to tell her to not take anymore nitro and we were moving her back to a room at this time.  Pt alert and talking in waiting room.

## 2016-01-21 NOTE — ED Notes (Signed)
Provided patient with applesauce and coffee. Ambulated to restroom with steady gait. Denies pain.

## 2016-01-21 NOTE — ED Provider Notes (Signed)
[facility] DEPT Provider Note   CSN: [id number] Arrival date & time: 01/21/16  1053   History   Chief Complaint Chief Complaint  Patient presents with  . Palpitations  . Arm Pain    HPI Candace Ross is a 82 y.o. female   The history is provided by the patient, a relative and medical records.   82 year old female with history of stage 1 breast cancer status post surgery, CAD status post stents (last cath 2016), hypertension, thyroid disease, anemia, osteoporosis presenting with shoulder pain. Onset was a couple days ago. Worsening since then. Particularly worsening since last night and during the day today. Located in left shoulder, left jaw, left upper arm. Described as squeezing. Worse with lying flat. Alleviated by nitroglycerin. Associated with some dry cough and shortness of breath that is exertional, though patient states that this is baseline for her. Also associated with some mild intermittent lightheadedness and feeling of palpitations, which patient states is new. The pain feels similar to her prior "cardiac pain" that led her to get stents placed, though she denies any prior history of actually having an acute MI. She denies associated diaphoresis, nausea, vomiting, leg swelling. No recent fevers. She called her cardiologist and was instructed to present to the ED for further evaluation.   Past Medical History:  Diagnosis Date  . Anemia   . Back pain   . Breast cancer ([place])   . Change in stool habits   . Dribbling urine   . H/O bone density study 2009  . Hearing loss   . Hypertension   . Osteoporosis   . Sinus problem   . SOB (shortness of breath) on exertion    stairs  . Spinal stenosis   . Thyroid disease   . Wears dentures     Patient Active Problem List   Diagnosis Date Noted  . Unstable angina pectoris ([place]) 01/21/2016  . CAD (coronary artery disease), native coronary artery 12/18/2014  . Angina pectoris ([place]) 12/17/2014  . Cancer of central  portion of female breast ([place]) 10/20/2011    Past Surgical History:  Procedure Laterality Date  . BARTHOLIN GLAND CYST EXCISION  1969  . BREAST LUMPECTOMY  05/02/77   benign  . CARDIAC CATHETERIZATION N/A 12/18/2014   Procedure: Left Heart Cath and Coronary Angiography;  Surgeon: Adrian Prows, MD;  Location: [facility];  Service: Cardiovascular;  Laterality: N/A;  . CARDIAC CATHETERIZATION N/A 12/18/2014   Procedure: Coronary Stent Intervention;  Surgeon: Adrian Prows, MD;  Location: [facility];  Service: Cardiovascular;  Laterality: N/A;  . HERNIA REPAIR  12/15/99    OB History    No data available       Home Medications    Prior to Admission medications   Medication Sig Start Date End Date Taking? Authorizing Provider  acetaminophen-codeine (TYLENOL #3) 300-30 MG tablet Take 1 tablet by mouth every 6 (six) hours as needed for moderate pain.    Yes Historical Provider, MD  amLODipine (NORVASC) 5 MG tablet Take 5 mg by mouth daily.   Yes Historical Provider, MD  aspirin 81 MG chewable tablet Chew 81 mg by mouth daily.   Yes Historical Provider, MD  atorvastatin (LIPITOR) 10 MG tablet Take 10 mg by mouth daily.   Yes Adrian Prows, MD  calcium-vitamin D 250-100 MG-UNIT per tablet Take 1 tablet by mouth 2 (two) times daily.   Yes Historical Provider, MD  clopidogrel (PLAVIX) 75 MG tablet Take 1 tablet (75 mg  total) by mouth daily with breakfast. 12/19/14  Yes Adrian Prows, MD  Cyanocobalamin (VITAMIN B 12 PO) Take by mouth.   Yes Historical Provider, MD  dorzolamide (TRUSOPT) 2 % ophthalmic solution Place 1 drop into both eyes 2 (two) times daily.   Yes Historical Provider, MD  isosorbide mononitrate (IMDUR) 60 MG 24 hr tablet Take 60 mg by mouth daily.   Yes Historical Provider, MD  latanoprost (XALATAN) 0.005 % ophthalmic solution Place 1 drop into both eyes at bedtime.    Yes Historical Provider, MD  levothyroxine (SYNTHROID, LEVOTHROID) 100 MCG tablet Take 100 mcg by mouth daily.  Except on Saturday and Sunday   Yes Historical Provider, MD  metoprolol succinate (TOPROL-XL) 25 MG 24 hr tablet Take 25 mg by mouth daily.   Yes Historical Provider, MD  Multiple Vitamins-Minerals (PRESERVISION AREDS PO) Take 1 capsule by mouth 2 (two) times daily.   Yes Historical Provider, MD  nitroGLYCERIN (NITROSTAT) 0.4 MG SL tablet Place 0.4 mg under the tongue every 5 (five) minutes as needed for chest pain.   Yes Historical Provider, MD  VITAMIN E PO Take by mouth.   Yes Historical Provider, MD  traMADol (ULTRAM) 50 MG tablet Take 1 tablet (50 mg total) by mouth every 6 (six) hours as needed for moderate pain (Right groin pain). Patient not taking: Reported on 01/21/2016 12/19/14   Adrian Prows, MD    Family History Family History  Problem Relation Age of Onset  . Colon cancer Brother     Social History Social History  Substance Use Topics  . Smoking status: Never Smoker  . Smokeless tobacco: Never Used  . Alcohol use No     Allergies   Other   Review of Systems Review of Systems  Constitutional: Negative for diaphoresis and fever.  Eyes: Negative for visual disturbance.  Respiratory: Positive for cough and shortness of breath.   Cardiovascular: Positive for palpitations. Negative for chest pain and leg swelling.  Gastrointestinal: Positive for nausea. Negative for abdominal pain and vomiting.  Genitourinary: Negative for dysuria and frequency.  Musculoskeletal: Negative for back pain.  Skin: Negative for rash.  Neurological: Positive for light-headedness. Negative for syncope, weakness and headaches.  Hematological: Does not bruise/bleed easily.  Psychiatric/Behavioral: Negative for confusion.  All other systems reviewed and are negative.   Physical Exam Updated Vital Signs BP (!) 135/54   Pulse 86   Temp 97.7 F (36.5 C) (Oral)   Resp 16   Ht 4\' 10"  (1.473 m)   Wt 58 kg   SpO2 96%   BMI 26.73 kg/m   Physical Exam  Constitutional: She is oriented to  person, place, and time. She appears well-developed and well-nourished. No distress.  HENT:  Head: Normocephalic and atraumatic.  Eyes: Conjunctivae are normal.  Neck: Neck supple. No JVD present.  Cardiovascular: Normal rate, regular rhythm and intact distal pulses.   Murmur heard. Pulmonary/Chest: Effort normal and breath sounds normal. No respiratory distress. She has no rales. She exhibits no tenderness.  Abdominal: Soft. There is no tenderness.  Musculoskeletal: She exhibits no edema.  Neurological: She is alert and oriented to person, place, and time.  Skin: Skin is warm and dry. She is not diaphoretic.  Psychiatric: She has a normal mood and affect.  Nursing note and vitals reviewed.    ED Treatments / Results  Labs (all labs ordered are listed, but only abnormal results are displayed) Labs Reviewed  BASIC METABOLIC PANEL - Abnormal; Notable for the following:  Result Value   Glucose, Bld 108 (*)    GFR calc non Af Amer 56 (*)    All other components within normal limits  CBC - Abnormal; Notable for the following:    Hemoglobin 10.2 (*)    HCT 33.5 (*)    MCH 25.1 (*)    RDW 16.0 (*)    All other components within normal limits  CBC - Abnormal; Notable for the following:    Hemoglobin 11.6 (*)    MCH 25.8 (*)    RDW 16.1 (*)    All other components within normal limits  CREATININE, SERUM - Abnormal; Notable for the following:    GFR calc non Af Amer 59 (*)    All other components within normal limits  CBG MONITORING, ED - Abnormal; Notable for the following:    Glucose-Capillary 134 (*)    All other components within normal limits  BASIC METABOLIC PANEL  LIPID PANEL  I-STAT TROPOININ, ED  I-STAT TROPOININ, ED    EKG  EKG Interpretation  Date/Time:  Friday January 21 2016 10:59:57 EDT Ventricular Rate:  82 PR Interval:  162 QRS Duration: 140 QT Interval:  418 QTC Calculation: 488 R Axis:   19 Text Interpretation:  Normal sinus rhythm Left bundle  branch block Abnormal ECG Confirmed by Lita Mains  MD, DAVID ([id number]) on 01/21/2016 11:40:24 AM       Radiology Dg Chest 2 View  Result Date: 01/21/2016 CLINICAL DATA:  Palpitations/CP,LEFT ARM PAIN TODAY EXAM: CHEST  2 VIEW COMPARISON:  10/16/2007 FINDINGS: Cardiac silhouette is mildly enlarged. There is along the left coronary artery stent. No mediastinal or hilar masses or evidence of adenopathy. Moderate size hiatal hernia. Lungs are clear.  No pleural effusion or pneumothorax. Bony thorax is demineralized. There are mild endplate depressions along the lower thoracic spine, stable. There changes from left breast surgery. IMPRESSION: No acute cardiopulmonary disease. Electronically Signed   By: Lajean Manes M.D.   On: 01/21/2016 12:18    Procedures Procedures (including critical care time)  Medications Ordered in ED Medications  aspirin chewable tablet 324 mg (not administered)    Or  aspirin suppository 300 mg (not administered)  aspirin EC tablet 81 mg (not administered)  nitroGLYCERIN (NITROSTAT) SL tablet 0.4 mg (not administered)  acetaminophen (TYLENOL) tablet 650 mg (not administered)  ondansetron (ZOFRAN) injection 4 mg (not administered)  heparin injection 5,000 Units (not administered)  levothyroxine (SYNTHROID, LEVOTHROID) tablet 100 mcg (not administered)  amLODipine (NORVASC) tablet 10 mg (not administered)  latanoprost (XALATAN) 0.005 % ophthalmic solution 1 drop (not administered)  metoprolol succinate (TOPROL-XL) 24 hr tablet 25 mg (not administered)  isosorbide mononitrate (IMDUR) 24 hr tablet 60 mg (not administered)  dorzolamide (TRUSOPT) 2 % ophthalmic solution 1 drop (not administered)  atorvastatin (LIPITOR) tablet 10 mg (not administered)  clopidogrel (PLAVIX) tablet 75 mg (not administered)  acetaminophen-codeine (TYLENOL #3) 300-30 MG per tablet 1 tablet (not administered)  calcium-vitamin D (OSCAL WITH D) 500-200 MG-UNIT per tablet 0.5 tablet (not  administered)  aspirin chewable tablet 324 mg (324 mg Oral Given 01/21/16 1601)  nitroGLYCERIN (NITROGLYN) 2 % ointment 0.5 inch (0.5 inches Topical Given 01/21/16 1605)     Initial Impression / Assessment and Plan / ED Course  I have reviewed the triage vital signs and the nursing notes.  Pertinent labs & imaging results that were available during my care of the patient were reviewed by me and considered in my medical decision making (see chart for details).  Clinical Course  Value Comment By Time  Comment 3:        (Reviewed) Ivin Booty, MD 08/35 216    82 year old female with history of stage 1 breast cancer status post surgery, CAD status post stents (last cath 2016), hypertension, thyroid disease, anemia, osteoporosis presenting with left shoulder pain and jaw pain, palpitations. Presentation is concerning for anginal symptoms. The patient is afebrile with stable vital signs and is well-appearing. Given aspirin. Nitro paste applied.   Labs, imaging, and EKG as documented in Epic, reviewed by me and considered in MDM. EKG notable for left bundle-branch block and left axis deviation similar to seen prior. Chest x-ray without any acute disease. Troponin negative 1, repeat pending. Labs of mild anemia with hemoglobin 10.2, electrolytes within normal limits, glucose normal, renal function normal.   Consultation placed to patient's cardiologist who is on-call today. Admitted to cardiology for further management in stable condition.  Case discussed with Dr. Billy Fischer who oversaw management of this patient.   Final Clinical Impressions(s) / ED Diagnoses   Final diagnoses:  Heart palpitations  Left arm pain  Left shoulder pain    New Prescriptions Current Discharge Medication List       Ivin Booty, MD 01/21/16 2124    Gareth Morgan, MD 01/23/16 2258

## 2016-01-21 NOTE — Progress Notes (Signed)
Chaplain approached patient in hallway to offer support.  Patient indicated she was very hungry, because she hadn't eaten all day.  Chaplain checked with RN who cleared patient to receive food. Per patient request, Chaplain provided applesauce and coffee to patient, offered emotional support.  Candace Ross, [place]

## 2016-01-21 NOTE — ED Triage Notes (Signed)
Pt presents with complaint of palpitations and L arm pain x 2 days. Pt. States pain is intermittent. Pt. Denies CP. Denies N/V/D. States slight SOB with palpitations.

## 2016-01-22 DIAGNOSIS — I129 Hypertensive chronic kidney disease with stage 1 through stage 4 chronic kidney disease, or unspecified chronic kidney disease: Secondary | ICD-10-CM | POA: Diagnosis not present

## 2016-01-22 DIAGNOSIS — I10 Essential (primary) hypertension: Secondary | ICD-10-CM | POA: Diagnosis not present

## 2016-01-22 DIAGNOSIS — E785 Hyperlipidemia, unspecified: Secondary | ICD-10-CM | POA: Diagnosis not present

## 2016-01-22 DIAGNOSIS — I447 Left bundle-branch block, unspecified: Secondary | ICD-10-CM | POA: Diagnosis not present

## 2016-01-22 DIAGNOSIS — R0789 Other chest pain: Secondary | ICD-10-CM | POA: Diagnosis not present

## 2016-01-22 DIAGNOSIS — Z955 Presence of coronary angioplasty implant and graft: Secondary | ICD-10-CM | POA: Diagnosis not present

## 2016-01-22 DIAGNOSIS — N183 Chronic kidney disease, stage 3 (moderate): Secondary | ICD-10-CM | POA: Diagnosis not present

## 2016-01-22 DIAGNOSIS — Z905 Acquired absence of kidney: Secondary | ICD-10-CM | POA: Diagnosis not present

## 2016-01-22 DIAGNOSIS — I25110 Atherosclerotic heart disease of native coronary artery with unstable angina pectoris: Secondary | ICD-10-CM | POA: Diagnosis not present

## 2016-01-22 DIAGNOSIS — M79602 Pain in left arm: Secondary | ICD-10-CM | POA: Diagnosis not present

## 2016-01-22 DIAGNOSIS — I25118 Atherosclerotic heart disease of native coronary artery with other forms of angina pectoris: Secondary | ICD-10-CM | POA: Diagnosis not present

## 2016-01-22 DIAGNOSIS — D539 Nutritional anemia, unspecified: Secondary | ICD-10-CM | POA: Diagnosis not present

## 2016-01-22 LAB — BASIC METABOLIC PANEL
Anion gap: 11 (ref 5–15)
BUN: 12 mg/dL (ref 6–20)
CALCIUM: 9.7 mg/dL (ref 8.9–10.3)
CHLORIDE: 104 mmol/L (ref 101–111)
CO2: 24 mmol/L (ref 22–32)
CREATININE: 0.9 mg/dL (ref 0.44–1.00)
GFR, EST NON AFRICAN AMERICAN: 56 mL/min — AB (ref >60)
Glucose, Bld: 91 mg/dL (ref 65–99)
Potassium: 4 mmol/L (ref 3.5–5.1)
SODIUM: 139 mmol/L (ref 135–145)

## 2016-01-22 LAB — LIPID PANEL
CHOL/HDL RATIO: 3.4 ratio
CHOLESTEROL: 201 mg/dL — AB (ref 0–200)
HDL: 59 mg/dL (ref >40)
LDL Cholesterol: 116 mg/dL — ABNORMAL HIGH (ref 0–99)
TRIGLYCERIDES: 131 mg/dL (ref <150)
VLDL: 26 mg/dL (ref 0–40)

## 2016-01-22 NOTE — Progress Notes (Signed)
Subjective:  Pain has improved but does continue to have intermittent episodes.   Objective:  Vital Signs in the last 24 hours: Temp:  [97.7 F (36.5 C)-98.4 F (36.9 C)] 98.2 F (36.8 C) (08/26 0552) Pulse Rate:  [63-86] 65 (08/26 0552) Resp:  [12-18] 15 (08/26 0552) BP: (128-168)/(51-85) 142/60 (08/26 0552) SpO2:  [91 %-100 %] 97 % (08/26 0552) Weight:  [57.8 kg (127 lb 8 oz)-58 kg (127 lb 14.4 oz)] 57.8 kg (127 lb 8 oz) (08/26 0552)  Intake/Output from previous day: No intake/output data recorded.  Physical Exam: General appearance: alert, cooperative, appears stated age and no distress Eyes: negative findings: lids and lashes normal Neck: no adenopathy, no carotid bruit, no JVD, supple, symmetrical, trachea midline and thyroid not enlarged, symmetric, no tenderness/mass/nodules Resp: clear to auscultation bilaterally Chest wall: no tenderness Cardio: regular rate and rhythm, S1, S2 normal, no murmur, click, rub or gallop GI: soft, non-tender; bowel sounds normal; no masses,  no organomegaly Extremities: extremities normal, atraumatic, no cyanosis or edema Pulses: All the peripheral pulses are equal except left pedal pulse minimal he diminished. No carotid bruit. Skin: Skin color, texture, turgor normal. No rashes or lesions Neurologic: Grossly normal  Lab Results: BMP  Recent Labs  01/21/16 1122 01/21/16 1856 01/22/16 0447  NA 136  --  139  K 4.0  --  4.0  CL 105  --  104  CO2 22  --  24  GLUCOSE 108*  --  91  BUN 14  --  12  CREATININE 0.90 0.87 0.90  CALCIUM 9.5  --  9.7  GFRNONAA 56* 59* 56*  GFRAA >60 >60 >60    CBC  Recent Labs Lab 01/21/16 1856  WBC 9.8  RBC 4.49  HGB 11.6*  HCT 37.6  PLT 342  MCV 83.7  MCH 25.8*  MCHC 30.9  RDW 16.1*    HEMOGLOBIN A1C No results found for: HGBA1C, MPG  Cardiac Panel (last 3 results) No results for input(s): CKTOTAL, CKMB, TROPONINI, RELINDX in the last 8760 hours.  BNP (last 3 results) No results  for input(s): PROBNP in the last 8760 hours.  TSH No results for input(s): TSH in the last 8760 hours.  CHOLESTEROL  Recent Labs  01/22/16 0447  CHOL 201*    Hepatic Function Panel No results for input(s): PROT, ALBUMIN, AST, ALT, ALKPHOS, BILITOT, BILIDIR, IBILI in the last 8760 hours.  Imaging: Dg Chest 2 View  Result Date: 01/21/2016 CLINICAL DATA:  Palpitations/CP,LEFT ARM PAIN TODAY EXAM: CHEST  2 VIEW COMPARISON:  10/16/2007 FINDINGS: Cardiac silhouette is mildly enlarged. There is along the left coronary artery stent. No mediastinal or hilar masses or evidence of adenopathy. Moderate size hiatal hernia. Lungs are clear.  No pleural effusion or pneumothorax. Bony thorax is demineralized. There are mild endplate depressions along the lower thoracic spine, stable. There changes from left breast surgery. IMPRESSION: No acute cardiopulmonary disease. Electronically Signed   By: Lajean Manes M.D.   On: 01/21/2016 12:18    Cardiac Studies:  EKG: unchanged from previous tracings, LBBB.   Assessment/Plan:  1. Left arm pain suggestive of her anginal symptoms, symptoms suggestive of unstable angina. 2. Coronary artery disease involving the native vessels,stenting of the mid right coronary artery and proximal and mid circumflex coronary artery with a 3.0 x 38 mm resolute integrity DES, stenting of the mid RCA with a 2.75 x 26 mm resolute integrity DES on 12/17/2014. Has residual 70% PDA stenosis being treated medically. 3. Left  bundle branch block, chronic 4. Hypertension 5. Hyperlipidemia 6. Unilateral kidney, history of right nephrectomy in the remote past, with underlying stage III CKD 7. Chronic and deficiency anemia  Recommendation: Pain is improving but still continues intermittently. Schedule for lexiscan (due to LBBB) nuclear stress test to evaluate for progression of CAD.   Rachel Bo, NP-C 01/22/2016, 10:22 AM [facility] Pager:  [phone] Office: [phone]

## 2016-01-22 NOTE — Progress Notes (Signed)
Patient refused bed alarm. AAO x4. Will continue to monitor patient.

## 2016-01-23 ENCOUNTER — Observation Stay (HOSPITAL_COMMUNITY): Payer: Commercial Managed Care - HMO

## 2016-01-23 DIAGNOSIS — M79602 Pain in left arm: Secondary | ICD-10-CM | POA: Diagnosis not present

## 2016-01-23 DIAGNOSIS — Z955 Presence of coronary angioplasty implant and graft: Secondary | ICD-10-CM | POA: Diagnosis not present

## 2016-01-23 DIAGNOSIS — N183 Chronic kidney disease, stage 3 (moderate): Secondary | ICD-10-CM | POA: Diagnosis not present

## 2016-01-23 DIAGNOSIS — I25110 Atherosclerotic heart disease of native coronary artery with unstable angina pectoris: Secondary | ICD-10-CM | POA: Diagnosis not present

## 2016-01-23 DIAGNOSIS — R079 Chest pain, unspecified: Secondary | ICD-10-CM | POA: Diagnosis not present

## 2016-01-23 DIAGNOSIS — E785 Hyperlipidemia, unspecified: Secondary | ICD-10-CM | POA: Diagnosis not present

## 2016-01-23 DIAGNOSIS — D539 Nutritional anemia, unspecified: Secondary | ICD-10-CM | POA: Diagnosis not present

## 2016-01-23 DIAGNOSIS — I447 Left bundle-branch block, unspecified: Secondary | ICD-10-CM | POA: Diagnosis not present

## 2016-01-23 DIAGNOSIS — Z905 Acquired absence of kidney: Secondary | ICD-10-CM | POA: Diagnosis not present

## 2016-01-23 DIAGNOSIS — R0789 Other chest pain: Secondary | ICD-10-CM | POA: Diagnosis not present

## 2016-01-23 DIAGNOSIS — I129 Hypertensive chronic kidney disease with stage 1 through stage 4 chronic kidney disease, or unspecified chronic kidney disease: Secondary | ICD-10-CM | POA: Diagnosis not present

## 2016-01-23 DIAGNOSIS — I10 Essential (primary) hypertension: Secondary | ICD-10-CM | POA: Diagnosis not present

## 2016-01-23 DIAGNOSIS — I25118 Atherosclerotic heart disease of native coronary artery with other forms of angina pectoris: Secondary | ICD-10-CM | POA: Diagnosis not present

## 2016-01-23 LAB — TROPONIN I: Troponin I: <0.03 ng/mL (ref <0.03)

## 2016-01-23 MED ORDER — TECHNETIUM TC 99M TETROFOSMIN IV KIT
10.0000 | PACK | Freq: Once | INTRAVENOUS | Status: AC | PRN
  Administered 2016-01-23: 10 via INTRAVENOUS

## 2016-01-23 MED ORDER — REGADENOSON 0.4 MG/5ML IV SOLN
INTRAVENOUS | Status: AC
  Filled 2016-01-23: qty 5

## 2016-01-23 MED ORDER — AMLODIPINE BESYLATE 10 MG PO TABS: 5.0000 mg | ORAL_TABLET | Freq: Every day | ORAL | 2 refills | Status: DC

## 2016-01-23 MED ORDER — TECHNETIUM TC 99M TETROFOSMIN IV KIT
30.0000 | PACK | Freq: Once | INTRAVENOUS | Status: AC | PRN
  Administered 2016-01-23: 30 via INTRAVENOUS

## 2016-01-23 MED ORDER — REGADENOSON 0.4 MG/5ML IV SOLN
0.4000 mg | Freq: Once | INTRAVENOUS | Status: AC
  Administered 2016-01-23: 0.4 mg via INTRAVENOUS
  Filled 2016-01-23: qty 5

## 2016-01-23 NOTE — Discharge Summary (Signed)
Pt got discharged to home, discharge instructions provided and patient showed understanding to it, IV taken out,Telemonitor DC,pt left unit in wheelchair with all of the belongings accompanied with a family member (Brother)

## 2016-01-23 NOTE — Discharge Summary (Signed)
Physician Discharge Summary  Patient ID: Candace Ross MRN: [id number] DOB/AGE: 80-17-1928 80 y.o.  Admit date: 01/21/2016 Discharge date: 01/23/2016  Discharge Diagnoses:  1. Left arm pain suggestive of her anginal symptoms, symptoms suggestive of unstable angina. MI ruled out. Symptoms improved with increasing Amlodipine. 2. Coronary artery disease involving the native vessels,stenting of the mid right coronary artery and proximal and mid circumflex coronary arterywith a 3.0 x 38 mm resolute integrity DES, stenting of the mid RCA with a 2.75 x 26 mm resolute integrity DES on 12/17/2014. Has residual 70% PDA stenosis being treated medically. 3. Left bundle branch block, chronic 4. Hypertension 5. Hyperlipidemia 6. Unilateral kidney, history of right nephrectomy in the remote past, with underlying stage III CKD 7. Chronic and deficiency anemia  Significant Diagnostic Studies: Nuclear Stress Test 01/23/2016:  IMPRESSION: 1. There is a large fixed defect involving the mid and distal anterior wall, anterior septum and anterior lateral wall. No reversibility identified.  2. There is hypokinesis involving the anterior septum, anterior wall and anterior lateral wall which corresponds to the large fixed defect.  3. Left ventricular ejection fraction 60%  Hospital Course:  Candace Ross  is a 82 y.o. female who lives independently, had been doing well until last night, started having left arm discomfort that lasted for several hours. She also complains of left shoulder pain and radiation of the pain to the left side of the neck. Symptoms similar to her prior angina pectoris when she had undergone coronary angiography and angioplasty to the circumflex and right coronary artery in July 2016. She does use occasional sublingual nitroglycerin with relief of pain for similar pain. This morning she woke up and due to persistent pain she presented to the emergency room. In the emergency room  she was started on nitroglycerin patch, and since then has not had any recurrence of chest pain.  Delta troponin negative for myocardial injury and EKG revealing left bundle branch block.   Her other history includes hypertension, hyperlipidemia, has single kidney with right nephrectomy in the remote past with persistent stage III chronic kidney disease. Amlodipine dose increased and she was r/o for MI and underwent Lexiscan stress on the day of d/c revealing no ischemia and low risk stress test and defect probably due to LBBB with normal LVEF.   Recommendations on discharge: Will F/U as OP in 2 weeks. Amlodipine 10 mg  Daily. BP needs f/u. Was well controlled most times while on tele. No significant arrhythmias except rare PVC.   Discharge Exam: Blood pressure (!) 162/62, pulse 83, temperature 98.3 F (36.8 C), temperature source Oral, resp. rate 18, height 4\' 10"  (1.473 m), weight 57.7 kg (127 lb 3.2 oz), SpO2 97 %.    General appearance: alert, cooperative, appears stated age and no distress Eyes: negative findings: lids and lashes normal Neck: no adenopathy, no carotid bruit, no JVD, supple, symmetrical, trachea midline and thyroid not enlarged, symmetric, no tenderness/mass/nodules Resp: clear to auscultation bilaterally Chest wall: no tenderness Cardio: regular rate and rhythm, S1, S2 paradoxical split with 1-2/6 SEM in right parasternal border, no murmur, click, rub or gallop GI: soft, non-tender; bowel sounds normal; no masses, no organomegaly Extremities: extremities normal, atraumatic, no cyanosis or edema Pulses: All the peripheral pulses are equal except left pedal pulse minimal he diminished. No carotid bruit. Skin: Skin color, texture, turgor normal. No rashes or lesions Neurologic: Grossly normal  Labs:   Lab Results  Component Value Date   WBC 9.8 01/21/2016  HGB 11.6 (L) 01/21/2016   HCT 37.6 01/21/2016   MCV 83.7 01/21/2016   PLT 342 01/21/2016    Recent Labs Lab  01/22/16 0447  NA 139  K 4.0  CL 104  CO2 24  BUN 12  CREATININE 0.90  CALCIUM 9.7  GLUCOSE 91    Lipid Panel     Component Value Date/Time   CHOL 201 (H) 01/22/2016 0447   TRIG 131 01/22/2016 0447   HDL 59 01/22/2016 0447   CHOLHDL 3.4 01/22/2016 0447   VLDL 26 01/22/2016 0447   LDLCALC 116 (H) 01/22/2016 0447   EKG: unchanged from previous tracings, LBBB.  Radiology: Dg Chest 2 View  Result Date: 01/21/2016 CLINICAL DATA:  Palpitations/CP,LEFT ARM PAIN TODAY EXAM: CHEST  2 VIEW COMPARISON:  10/16/2007 FINDINGS: Cardiac silhouette is mildly enlarged. There is along the left coronary artery stent. No mediastinal or hilar masses or evidence of adenopathy. Moderate size hiatal hernia. Lungs are clear.  No pleural effusion or pneumothorax. Bony thorax is demineralized. There are mild endplate depressions along the lower thoracic spine, stable. There changes from left breast surgery. IMPRESSION: No acute cardiopulmonary disease. Electronically Signed   By: Lajean Manes M.D.   On: 01/21/2016 12:18      FOLLOW UP PLANS AND APPOINTMENTS    Medication List    TAKE these medications   acetaminophen-codeine 300-30 MG tablet Commonly known as:  TYLENOL #3 Take 1 tablet by mouth every 6 (six) hours as needed for moderate pain.   amLODipine 10 MG tablet Commonly known as:  NORVASC Take 0.5 tablets (5 mg total) by mouth daily. What changed:  medication strength   aspirin 81 MG chewable tablet Chew 81 mg by mouth daily.   atorvastatin 10 MG tablet Commonly known as:  LIPITOR Take 10 mg by mouth daily.   calcium-vitamin D 250-100 MG-UNIT tablet Take 1 tablet by mouth 2 (two) times daily.   clopidogrel 75 MG tablet Commonly known as:  PLAVIX Take 1 tablet (75 mg total) by mouth daily with breakfast.   dorzolamide 2 % ophthalmic solution Commonly known as:  TRUSOPT Place 1 drop into both eyes 2 (two) times daily.   isosorbide mononitrate 60 MG 24 hr tablet Commonly  known as:  IMDUR Take 60 mg by mouth daily.   latanoprost 0.005 % ophthalmic solution Commonly known as:  XALATAN Place 1 drop into both eyes at bedtime.   levothyroxine 100 MCG tablet Commonly known as:  SYNTHROID, LEVOTHROID Take 100 mcg by mouth daily. Except on Saturday and Sunday   metoprolol succinate 25 MG 24 hr tablet Commonly known as:  TOPROL-XL Take 25 mg by mouth daily.   nitroGLYCERIN 0.4 MG SL tablet Commonly known as:  NITROSTAT Place 0.4 mg under the tongue every 5 (five) minutes as needed for chest pain.   PRESERVISION AREDS PO Take 1 capsule by mouth 2 (two) times daily.   traMADol 50 MG tablet Commonly known as:  ULTRAM Take 1 tablet (50 mg total) by mouth every 6 (six) hours as needed for moderate pain (Right groin pain).   VITAMIN B 12 PO Take by mouth.   VITAMIN E PO Take by mouth.      Adrian Prows, MD 01/23/2016, 12:16 PM [facility] [place] Pager: [phone] Office: [phone] If no answer: Cell:  [phone]

## 2016-02-01 DIAGNOSIS — I25118 Atherosclerotic heart disease of native coronary artery with other forms of angina pectoris: Secondary | ICD-10-CM | POA: Diagnosis not present

## 2016-02-01 DIAGNOSIS — I10 Essential (primary) hypertension: Secondary | ICD-10-CM | POA: Diagnosis not present

## 2016-02-01 DIAGNOSIS — E7800 Pure hypercholesterolemia, unspecified: Secondary | ICD-10-CM | POA: Diagnosis not present

## 2016-04-17 IMAGING — US US EXTREM LOW VENOUS*R*
1 series · 13 of 24 positions shown · non-contrast
Comparison: None.

CLINICAL DATA: Right lower extremity pain and swelling after
catheterization approximately 3 weeks ago. Evaluate for DVT.



[Series 1: us extrem low venous*right* · 13 of 51 slices shown]
[im 1/51]
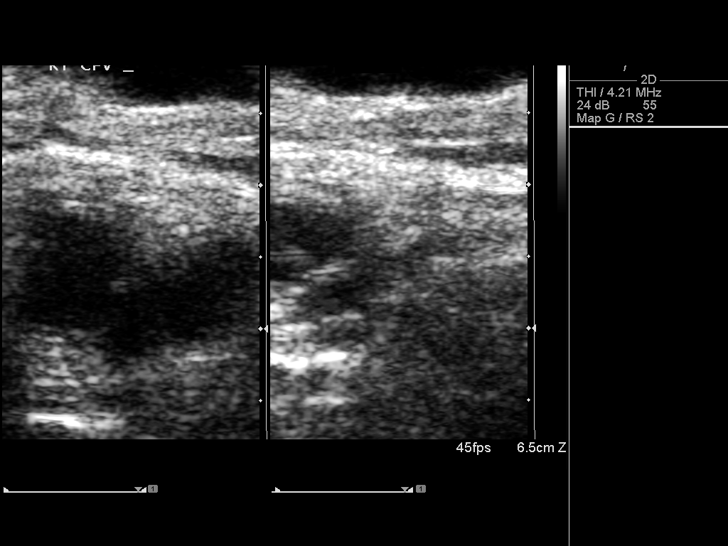
[im 5/51]
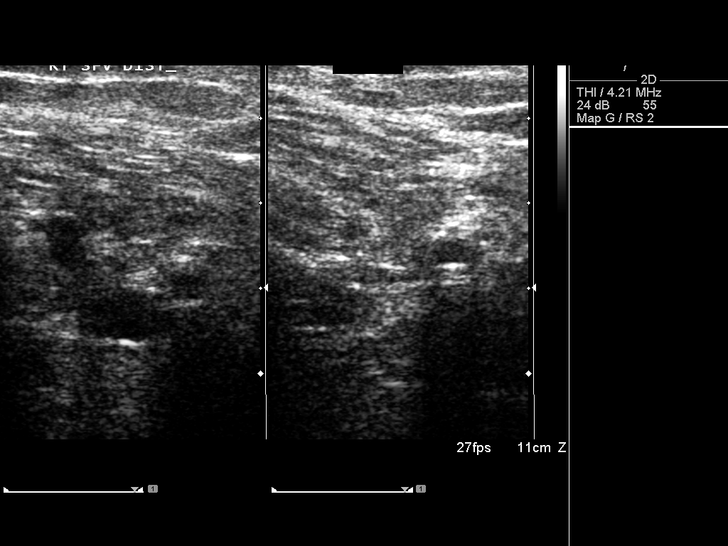
[im 9/51]
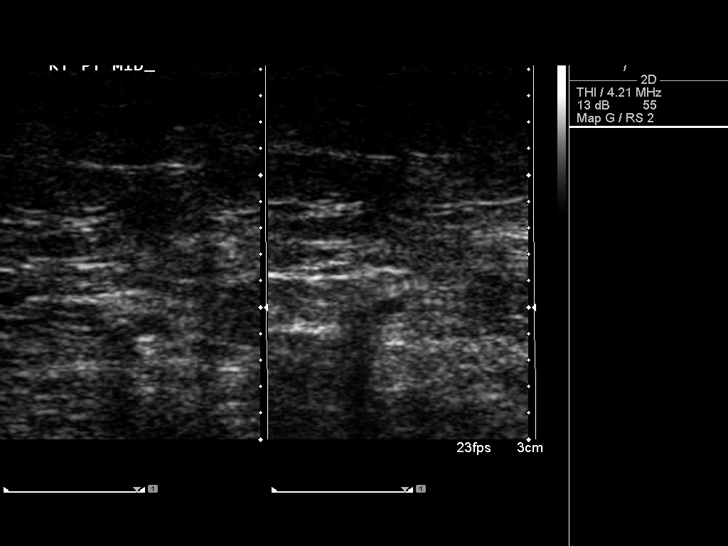
[im 14/51]
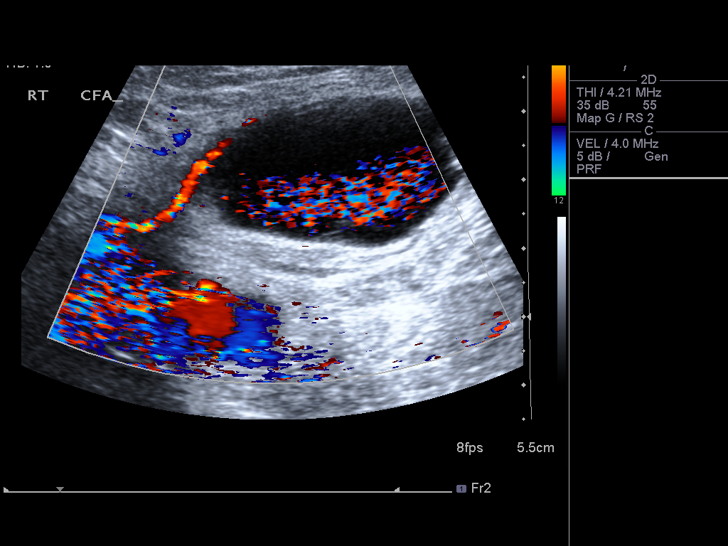
[im 18/51]
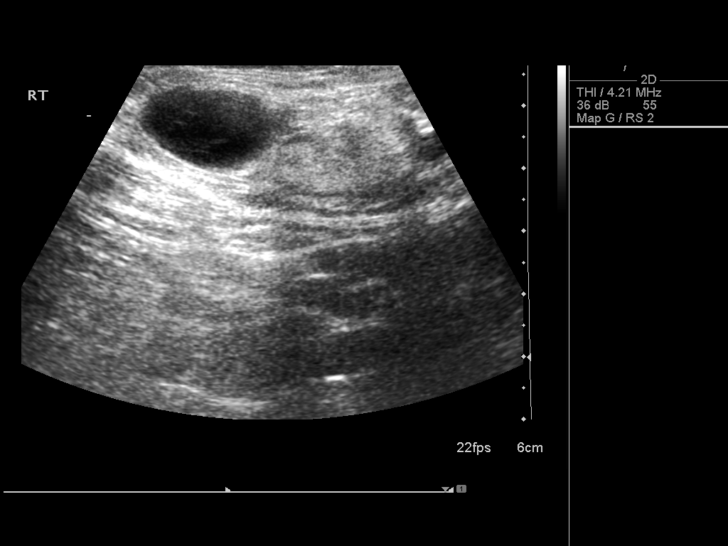
[im 22/51]
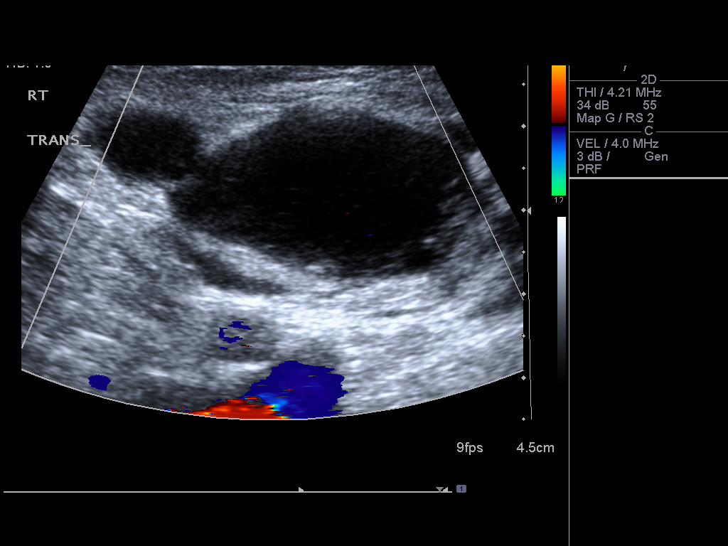
[im 27/51]
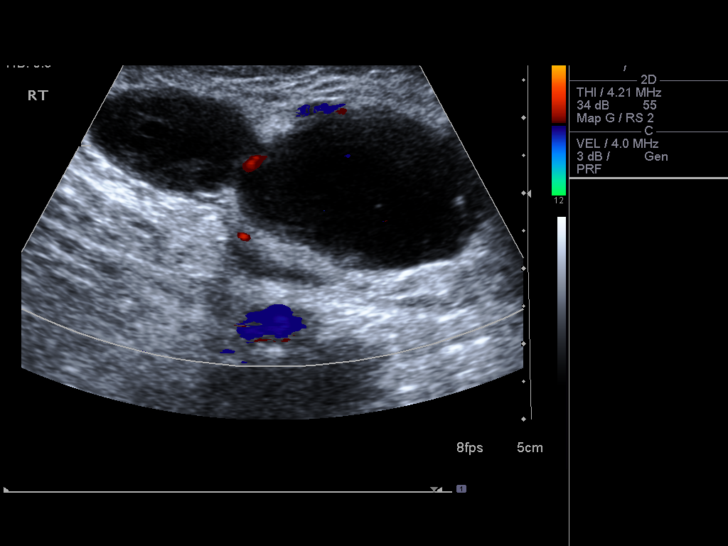
[im 29/51]
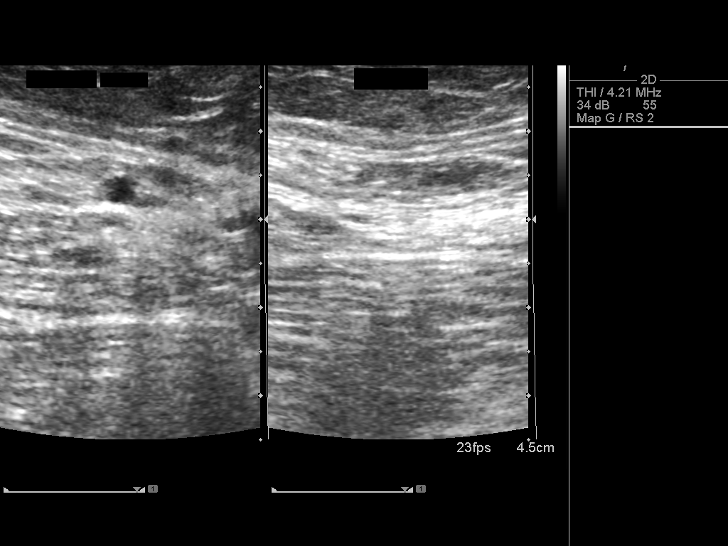
[im 33/51]
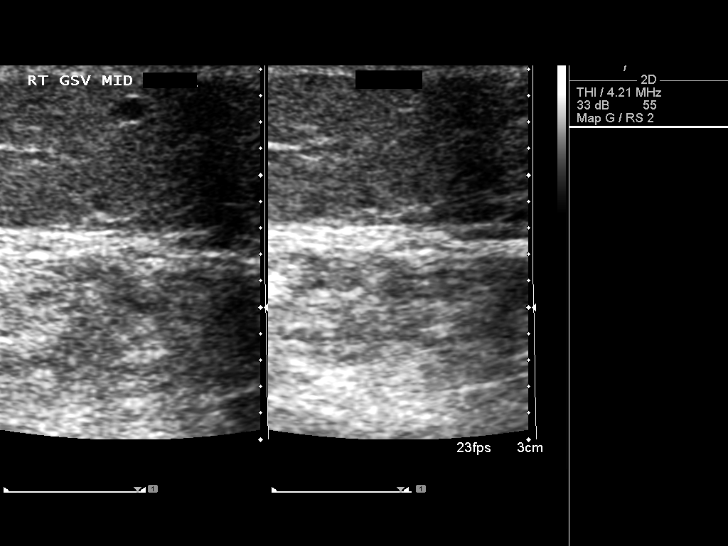
[im 37/51]
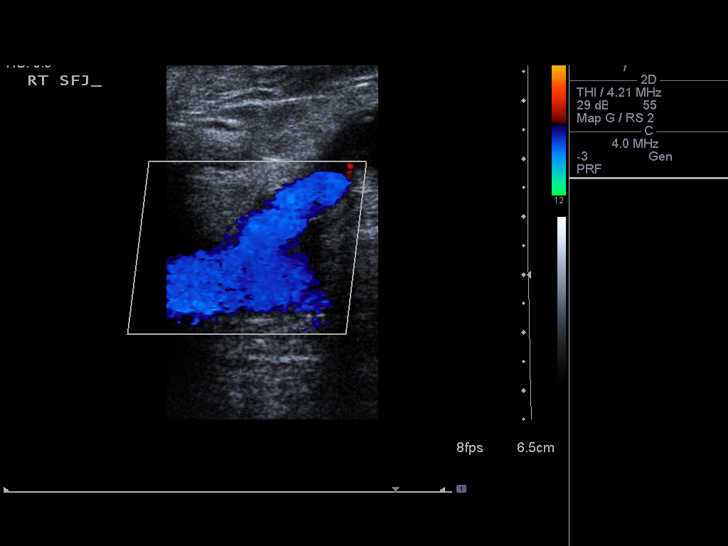
[im 42/51]
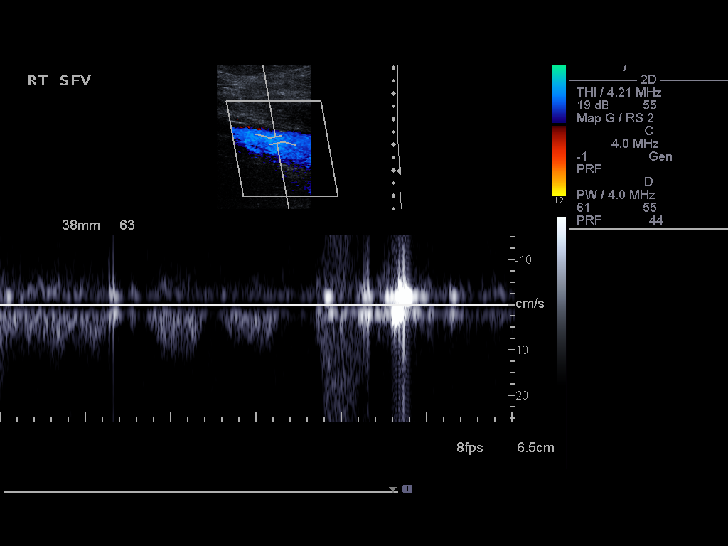
[im 46/51]
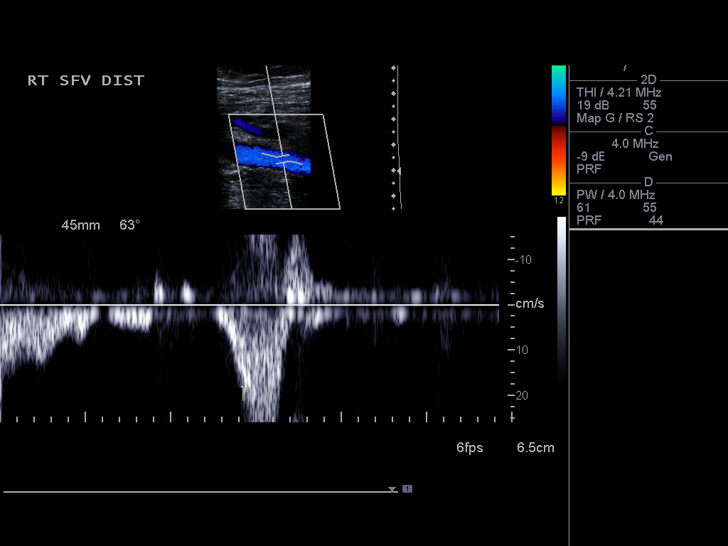
[im 51/51]
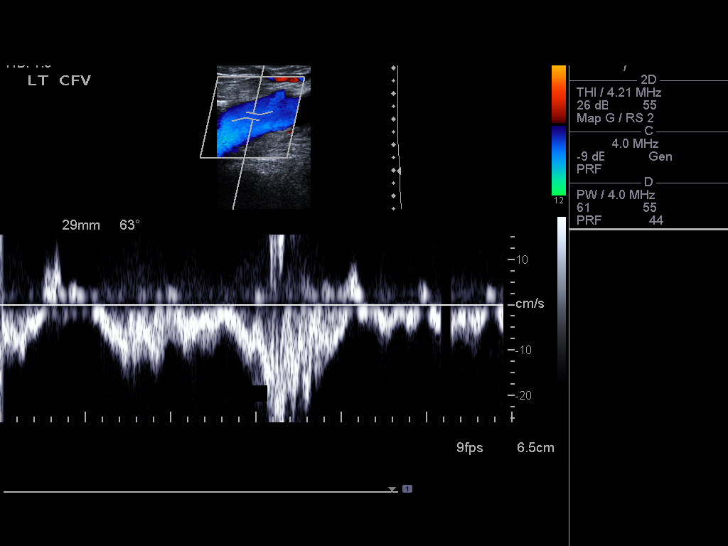

[13 of 24 positions shown; findings below may reference images not displayed]

FINDINGS: Contralateral Common Femoral Vein: Respiratory phasicity is normal
and symmetric with the symptomatic side. No evidence of thrombus.
Normal compressibility.

Common Femoral Vein: No evidence of thrombus. Normal
compressibility, respiratory phasicity and response to augmentation.

Saphenofemoral Junction: No evidence of thrombus. Normal
compressibility and flow on color Doppler imaging.

Profunda Femoral Vein: No evidence of thrombus. Normal
compressibility and flow on color Doppler imaging.

Femoral Vein: No evidence of thrombus. Normal compressibility,
respiratory phasicity and response to augmentation.

Popliteal Vein: No evidence of thrombus. Normal compressibility,
respiratory phasicity and response to augmentation.

Calf Veins: No evidence of thrombus. Normal compressibility and flow
on color Doppler imaging.

Superficial Great Saphenous Vein: No evidence of thrombus. Normal
compressibility and flow on color Doppler imaging.

Venous Reflux:  None.

Other Findings: Note is made of a bilobed at least 3.7 x 1.9 x
cm mixed echogenic though largely anechoic structure within the
subcutaneous tissues of the right groin anterior to the right common
femoral artery. While there is no definitive blood flow demonstrated
within either of these bilobed anechoic structures (representative
images 22 and 24), there is an apparent patent serpiginous
connection with the adjacent right common femoral artery (image 15).
No definitive adjacent soft tissue bruit.
IMPRESSION: 1. No evidence of DVT with the right lower extremity.
2. Bilobed at least 5.5 cm suspected thrombosed bilobed
pseudoaneurysm within the right groin, potentially the sequela of
provided history of recent catheterization. While there is no
definitive blood flow demonstrated within the dominant components of
the bilobed pseudoaneurysm, there is an apparent patent fistulous
connection with the adjacent right common femoral artery.
These results will be called to the ordering clinician or
representative by the Radiologist Assistant, and communication
documented in the PACS or zVision Dashboard.

## 2016-05-05 DIAGNOSIS — M810 Age-related osteoporosis without current pathological fracture: Secondary | ICD-10-CM | POA: Diagnosis not present

## 2016-05-05 DIAGNOSIS — E038 Other specified hypothyroidism: Secondary | ICD-10-CM | POA: Diagnosis not present

## 2016-05-05 DIAGNOSIS — I10 Essential (primary) hypertension: Secondary | ICD-10-CM | POA: Diagnosis not present

## 2016-05-05 DIAGNOSIS — E784 Other hyperlipidemia: Secondary | ICD-10-CM | POA: Diagnosis not present

## 2016-05-12 DIAGNOSIS — M545 Low back pain: Secondary | ICD-10-CM | POA: Diagnosis not present

## 2016-05-12 DIAGNOSIS — D6489 Other specified anemias: Secondary | ICD-10-CM | POA: Diagnosis not present

## 2016-05-12 DIAGNOSIS — E784 Other hyperlipidemia: Secondary | ICD-10-CM | POA: Diagnosis not present

## 2016-05-12 DIAGNOSIS — M810 Age-related osteoporosis without current pathological fracture: Secondary | ICD-10-CM | POA: Diagnosis not present

## 2016-05-12 DIAGNOSIS — E038 Other specified hypothyroidism: Secondary | ICD-10-CM | POA: Diagnosis not present

## 2016-05-12 DIAGNOSIS — Z0000 Encounter for general adult medical examination without abnormal findings: Secondary | ICD-10-CM | POA: Diagnosis not present

## 2016-05-12 DIAGNOSIS — I10 Essential (primary) hypertension: Secondary | ICD-10-CM | POA: Diagnosis not present

## 2016-05-12 DIAGNOSIS — H3530 Unspecified macular degeneration: Secondary | ICD-10-CM | POA: Diagnosis not present

## 2016-05-12 DIAGNOSIS — Z9861 Coronary angioplasty status: Secondary | ICD-10-CM | POA: Diagnosis not present

## 2016-05-17 DIAGNOSIS — I25118 Atherosclerotic heart disease of native coronary artery with other forms of angina pectoris: Secondary | ICD-10-CM | POA: Diagnosis not present

## 2016-05-17 DIAGNOSIS — E7800 Pure hypercholesterolemia, unspecified: Secondary | ICD-10-CM | POA: Diagnosis not present

## 2016-05-17 DIAGNOSIS — I10 Essential (primary) hypertension: Secondary | ICD-10-CM | POA: Diagnosis not present

## 2016-05-17 DIAGNOSIS — R0989 Other specified symptoms and signs involving the circulatory and respiratory systems: Secondary | ICD-10-CM | POA: Diagnosis not present

## 2016-05-25 DIAGNOSIS — R0989 Other specified symptoms and signs involving the circulatory and respiratory systems: Secondary | ICD-10-CM | POA: Diagnosis not present

## 2016-08-10 DIAGNOSIS — H401132 Primary open-angle glaucoma, bilateral, moderate stage: Secondary | ICD-10-CM | POA: Diagnosis not present

## 2016-08-10 DIAGNOSIS — C50919 Malignant neoplasm of unspecified site of unspecified female breast: Secondary | ICD-10-CM | POA: Diagnosis not present

## 2016-08-10 DIAGNOSIS — D3131 Benign neoplasm of right choroid: Secondary | ICD-10-CM | POA: Diagnosis not present

## 2016-08-10 DIAGNOSIS — Z961 Presence of intraocular lens: Secondary | ICD-10-CM | POA: Diagnosis not present

## 2016-08-10 DIAGNOSIS — H353132 Nonexudative age-related macular degeneration, bilateral, intermediate dry stage: Secondary | ICD-10-CM | POA: Diagnosis not present

## 2016-08-11 DIAGNOSIS — H9113 Presbycusis, bilateral: Secondary | ICD-10-CM | POA: Diagnosis not present

## 2016-08-14 DIAGNOSIS — M180 Bilateral primary osteoarthritis of first carpometacarpal joints: Secondary | ICD-10-CM | POA: Diagnosis not present

## 2016-08-14 DIAGNOSIS — M65341 Trigger finger, right ring finger: Secondary | ICD-10-CM | POA: Diagnosis not present

## 2016-10-18 ENCOUNTER — Telehealth (INDEPENDENT_AMBULATORY_CARE_PROVIDER_SITE_OTHER): Payer: Self-pay | Admitting: Physical Medicine and Rehabilitation

## 2016-10-18 NOTE — Telephone Encounter (Signed)
Can you look at [facility] and see if we stopped Plavix for last injection, if we did see if we can get approval to have that dc'd

## 2016-10-19 NOTE — Telephone Encounter (Signed)
BT auth faxed to Dr. Irven Shelling office. Will need prior auth for [phone]

## 2016-10-19 NOTE — Telephone Encounter (Signed)
Faxed auth form with last 2 office notes from Dr. Ernestina Patches to [facility]

## 2016-10-26 DIAGNOSIS — E038 Other specified hypothyroidism: Secondary | ICD-10-CM | POA: Diagnosis not present

## 2016-10-26 DIAGNOSIS — Z9861 Coronary angioplasty status: Secondary | ICD-10-CM | POA: Diagnosis not present

## 2016-10-26 DIAGNOSIS — I10 Essential (primary) hypertension: Secondary | ICD-10-CM | POA: Diagnosis not present

## 2016-10-26 DIAGNOSIS — Z6826 Body mass index (BMI) 26.0-26.9, adult: Secondary | ICD-10-CM | POA: Diagnosis not present

## 2016-10-26 DIAGNOSIS — D6489 Other specified anemias: Secondary | ICD-10-CM | POA: Diagnosis not present

## 2016-10-27 NOTE — Telephone Encounter (Signed)
Scheduled for 11/06/16. [facility] Auth # [id number]

## 2016-10-27 NOTE — Telephone Encounter (Signed)
Received auth from insurance and ok to d/c Plavix. Called patient and left a message for her to call to schedule.

## 2016-11-06 ENCOUNTER — Encounter (INDEPENDENT_AMBULATORY_CARE_PROVIDER_SITE_OTHER): Payer: Self-pay | Admitting: Physical Medicine and Rehabilitation

## 2016-11-06 ENCOUNTER — Ambulatory Visit (INDEPENDENT_AMBULATORY_CARE_PROVIDER_SITE_OTHER): Payer: Self-pay

## 2016-11-06 ENCOUNTER — Ambulatory Visit (INDEPENDENT_AMBULATORY_CARE_PROVIDER_SITE_OTHER): Payer: Medicare HMO | Admitting: Physical Medicine and Rehabilitation

## 2016-11-06 VITALS — BP 135/55 | HR 73

## 2016-11-06 DIAGNOSIS — M5416 Radiculopathy, lumbar region: Secondary | ICD-10-CM

## 2016-11-06 MED ORDER — LIDOCAINE HCL (PF) 1 % IJ SOLN
2.0000 mL | Freq: Once | INTRAMUSCULAR | Status: AC
  Administered 2016-11-06: 2 mL

## 2016-11-06 MED ORDER — METHYLPREDNISOLONE ACETATE 80 MG/ML IJ SUSP
80.0000 mg | Freq: Once | INTRAMUSCULAR | Status: AC
  Administered 2016-11-06: 80 mg

## 2016-11-06 NOTE — Patient Instructions (Signed)

## 2016-11-06 NOTE — Procedures (Signed)
Lumbosacral Transforaminal Epidural Steroid Injection - Infraneural Approach with Fluoroscopic Guidance  Patient: Candace Ross      Date of Birth: [birth year] MRN: [id number] PCP: Leanna Battles, MD      Visit Date: 11/06/2016   Mrs. Urquilla is a very pleasant 83 year old female that we've seen in the past and her notes are on [facility]. I last saw her in August of last year and completed right L5 transforaminal epidural steroid injection with great relief of her low back and radicular leg pain. She states that recently it has started to return with a vengeance. It is in a classic L5 distribution down to the lateral ankle and foot. She does have significant scoliosis of the lumbar spine with collapse on the right particularly with foraminal and lateral recess stenosis at really multiple levels on the right but with clear L5 pain pattern. She feels like her leg and foot vibrates at times. She's had no new falls or trauma. She is ambulating with a cane. Brief exam today shows good distal strength without clonus. We are repeat the L5 injection since it did so well.  Universal Protocol:     Consent Given By: the patient  Position: PRONE   Additional Comments: Vital signs were monitored before and after the procedure. Patient was prepped and draped in the usual sterile fashion. The correct patient, procedure, and site was verified.   Injection Procedure Details:  Procedure Site One Meds Administered:  Meds ordered this encounter  Medications  . lidocaine (PF) (XYLOCAINE) 1 % injection 2 mL  . methylPREDNISolone acetate (DEPO-MEDROL) injection 80 mg      Laterality: Right  Location/Site:  L5-S1  Needle size: 22 G  Needle type: Spinal  Needle Placement: Transforaminal  Findings:  -Contrast Used: 0.5 mL iohexol 180 mg iodine/mL   -Comments: Excellent flow of contrast along the nerve and into the epidural space.  Procedure Details: After squaring off the end-plates of the desired  vertebral level to get a true AP view, the C-arm was obliqued to the painful side so that the superior articulating process is positioned about 1/3 the length of the inferior endplate.  The needle was aimed toward the junction of the superior articular process and the transverse process of the inferior vertebrae. The needle's initial entry is in the lower third of the foramen through Kambin's triangle. The soft tissues overlying this target were infiltrated with 2-3 ml. of 1% Lidocaine without Epinephrine.  The spinal needle was then inserted and advanced toward the target using a "trajectory" view along the fluoroscope beam.  Under AP and lateral visualization, the needle was advanced so it did not puncture dura and did not traverse medially beyond the 6 o'clock position of the pedicle. Bi-planar projections were used to confirm position. Aspiration was confirmed to be negative for CSF and/or blood. A 1-2 ml. volume of Isovue-250 was injected and flow of contrast was noted at each level. Radiographs were obtained for documentation purposes.   After attaining the desired flow of contrast documented above, a 0.5 to 1.0 ml test dose of 0.25% Marcaine was injected into each respective transforaminal space.  The patient was observed for 90 seconds post injection.  After no sensory deficits were reported, and normal lower extremity motor function was noted,   the above injectate was administered so that equal amounts of the injectate were placed at each foramen (level) into the transforaminal epidural space.   Additional Comments:  The patient tolerated the procedure well No complications  occurred Dressing: Band-Aid    Post-procedure details: Patient was observed during the procedure. Post-procedure instructions were reviewed.  Patient left the clinic in stable condition.

## 2016-11-06 NOTE — Progress Notes (Deleted)
Pain across low back and worse on right side. Radiating down right leg to ankle. Constant. Worse with walking and standing. States at times she feels like leg is vibrating.  Stopped taking Plavix on 10/29/16

## 2016-11-10 DIAGNOSIS — Z9842 Cataract extraction status, left eye: Secondary | ICD-10-CM | POA: Diagnosis not present

## 2016-11-10 DIAGNOSIS — H02831 Dermatochalasis of right upper eyelid: Secondary | ICD-10-CM | POA: Diagnosis not present

## 2016-11-10 DIAGNOSIS — Z961 Presence of intraocular lens: Secondary | ICD-10-CM | POA: Diagnosis not present

## 2016-11-10 DIAGNOSIS — Z9841 Cataract extraction status, right eye: Secondary | ICD-10-CM | POA: Diagnosis not present

## 2016-11-10 DIAGNOSIS — H02834 Dermatochalasis of left upper eyelid: Secondary | ICD-10-CM | POA: Diagnosis not present

## 2016-11-10 DIAGNOSIS — D3131 Benign neoplasm of right choroid: Secondary | ICD-10-CM | POA: Diagnosis not present

## 2016-11-10 DIAGNOSIS — H353132 Nonexudative age-related macular degeneration, bilateral, intermediate dry stage: Secondary | ICD-10-CM | POA: Diagnosis not present

## 2016-11-10 DIAGNOSIS — H43813 Vitreous degeneration, bilateral: Secondary | ICD-10-CM | POA: Diagnosis not present

## 2016-11-10 DIAGNOSIS — I10 Essential (primary) hypertension: Secondary | ICD-10-CM | POA: Diagnosis not present

## 2016-11-13 ENCOUNTER — Telehealth (INDEPENDENT_AMBULATORY_CARE_PROVIDER_SITE_OTHER): Payer: Self-pay | Admitting: Physical Medicine and Rehabilitation

## 2016-11-13 NOTE — Telephone Encounter (Signed)
Could either repeat x1 or do facet block. Tell her sometimes it does take a second injection

## 2016-11-14 DIAGNOSIS — R0989 Other specified symptoms and signs involving the circulatory and respiratory systems: Secondary | ICD-10-CM | POA: Diagnosis not present

## 2016-11-14 DIAGNOSIS — I6521 Occlusion and stenosis of right carotid artery: Secondary | ICD-10-CM | POA: Diagnosis not present

## 2016-11-14 NOTE — Telephone Encounter (Signed)
Left message for patient to call back to discuss.

## 2016-11-15 NOTE — Telephone Encounter (Signed)
Faxed auth form with the Dr. Romona Curls last office note to [facility] [phone]

## 2016-11-15 NOTE — Telephone Encounter (Signed)
Patient would like to try a repeat of right L5 TF. [employer] auth for [id number]. Patient is taking Plavix, Dr. Einar Gip gave ok to hold for 7 day for previous injection last week.

## 2016-11-22 DIAGNOSIS — E7800 Pure hypercholesterolemia, unspecified: Secondary | ICD-10-CM | POA: Diagnosis not present

## 2016-11-22 DIAGNOSIS — R0989 Other specified symptoms and signs involving the circulatory and respiratory systems: Secondary | ICD-10-CM | POA: Diagnosis not present

## 2016-11-22 DIAGNOSIS — I10 Essential (primary) hypertension: Secondary | ICD-10-CM | POA: Diagnosis not present

## 2016-11-22 DIAGNOSIS — I25118 Atherosclerotic heart disease of native coronary artery with other forms of angina pectoris: Secondary | ICD-10-CM | POA: Diagnosis not present

## 2016-11-27 ENCOUNTER — Telehealth (INDEPENDENT_AMBULATORY_CARE_PROVIDER_SITE_OTHER): Payer: Self-pay | Admitting: Physical Medicine and Rehabilitation

## 2016-11-27 NOTE — Telephone Encounter (Signed)
Received OK from Dr. Einar Gip for patient to hold Plavix for 5 days before injection. Scheduled for 7/17 at 1300.

## 2016-11-27 NOTE — Telephone Encounter (Signed)
-----   Message from Adrian Prows, MD sent at 11/26/2016  9:55 PM EDT ----- Regarding: RE: Plavix Yes. Stable to hold Plavix for 5 days and procedure 6th day should be fine. JG ----- Message ----- From: Sherre Scarlet, RT Sent: 11/23/2016  12:53 PM To: Adrian Prows, MD Subject: Plavix                                         Patient wants to schedule repeat epidural steroid injection. Will it be ok to hold Plavix for 7 days again?

## 2016-12-06 ENCOUNTER — Ambulatory Visit (INDEPENDENT_AMBULATORY_CARE_PROVIDER_SITE_OTHER): Payer: Medicare HMO

## 2016-12-06 ENCOUNTER — Ambulatory Visit (INDEPENDENT_AMBULATORY_CARE_PROVIDER_SITE_OTHER): Payer: Medicare HMO | Admitting: Physical Medicine and Rehabilitation

## 2016-12-06 ENCOUNTER — Encounter (INDEPENDENT_AMBULATORY_CARE_PROVIDER_SITE_OTHER): Payer: Self-pay | Admitting: Physical Medicine and Rehabilitation

## 2016-12-06 VITALS — BP 142/59 | HR 74

## 2016-12-06 DIAGNOSIS — M5416 Radiculopathy, lumbar region: Secondary | ICD-10-CM

## 2016-12-06 MED ORDER — LIDOCAINE HCL (PF) 1 % IJ SOLN
2.0000 mL | Freq: Once | INTRAMUSCULAR | Status: AC
  Administered 2016-12-06: 2 mL

## 2016-12-06 MED ORDER — METHYLPREDNISOLONE ACETATE 80 MG/ML IJ SUSP
80.0000 mg | Freq: Once | INTRAMUSCULAR | Status: AC
  Administered 2016-12-06: 80 mg

## 2016-12-06 NOTE — Progress Notes (Deleted)
Patient does not recall getting any relief with last injection. Pain is down right leg to foot.

## 2016-12-06 NOTE — Progress Notes (Signed)
Candace Ross - 83 y.o. female MRN [id number]  Date of birth: [birth year]  Office Visit Note: Visit Date: 12/06/2016 PCP: Candace Battles, MD Referred by: Candace Battles, MD  Subjective: Chief Complaint  Patient presents with  . Lower Back - Pain   HPI: Candace Ross is a very pleasant 83 year old female that we know quite well over the last few years. She is followed by Candace Ross. I saw her a couple of months ago for reevaluation and ultimately we completed right L5 transforaminal epidural steroid injection on June 11. The injection looks fairly well place but she does have significant foraminal stenosis at L4-5 and L5-S1. She reported very little relief or at least some relief initially but then not very long-lived. We decided because of the appearance of the injection itself and the fact that it didn't help like it has in the past to go ahead and repeat the injection today. She's had no new symptoms. She still has right radicular pain and a pretty classic L5 distribution. She's had no focal weakness. MRI is reviewed again below it shows significant scoliotic deformity of the thoracolumbar spine with foraminal narrowing particularly at L4-5 but also at L5-S1.    ROS Otherwise per HPI.  Assessment & Plan: Visit Diagnoses:  1. Lumbar radiculopathy     Plan: Findings:  Repeat right L5 transforaminal epidural steroid injection with fluoroscopic guidance. We did have excellent flow of contrast and it did reproduce a lot of her symptoms when the medication was injected. She felt a little bit better after the injection diagnostically. Depending on his relief at least one time I would look at an L4 injection on the right. Unfortunately she has significant issues and may ultimately need different medication tight management. 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     Meds & Orders:  Meds ordered this encounter  Medications  . lidocaine (PF) (XYLOCAINE) 1 % injection 2 mL  . methylPREDNISolone acetate (DEPO-MEDROL) injection 80 mg    Orders Placed This Encounter  Procedures  . XR C-ARM NO REPORT  . Epidural Steroid injection    Follow-up: Return if symptoms worsen or fail to improve.   Procedures: No procedures performed  Lumbosacral Transforaminal Epidural Steroid Injection - Infraneural Approach with Fluoroscopic Guidance  Patient: Candace Ross      Date of Birth: 06/07/1926 MRN: [id number] PCP: Candace Battles, MD      Visit Date: 12/06/2016   Universal Protocol:     Consent Given By: the patient  Position: PRONE   Additional Comments: Vital signs were monitored before and after the procedure. Patient was prepped and draped in the usual sterile fashion. The correct patient, procedure, and site was verified.   Injection Procedure Details:  Procedure Site One Meds Administered:  Meds ordered this encounter  Medications  . lidocaine (PF) (XYLOCAINE) 1 % injection 2 mL  . methylPREDNISolone acetate (DEPO-MEDROL) injection 80 mg      Laterality: Right  Location/Site:  L5-S1  Needle size: 22 G  Needle type: Spinal  Needle Placement: Transforaminal  Findings:  -Contrast Used: 1 mL iohexol 180 mg iodine/mL   -Comments: Excellent flow of contrast along the nerve and into the epidural space.  Procedure Details: After squaring off the end-plates of the desired vertebral level to get a true AP view, the C-arm was obliqued to the painful side so  that the superior articulating process is positioned about 1/3 the length of the inferior endplate.  The needle was aimed toward the junction of the superior articular process and the transverse process of the inferior vertebrae. The needle's initial entry is in the lower third of the foramen through Kambin's triangle. The soft tissues overlying this target were infiltrated with  2-3 ml. of 1% Lidocaine without Epinephrine.  The spinal needle was then inserted and advanced toward the target using a "trajectory" view along the fluoroscope beam.  Under AP and lateral visualization, the needle was advanced so it did not puncture dura and did not traverse medially beyond the 6 o'clock position of the pedicle. Bi-planar projections were used to confirm position. Aspiration was confirmed to be negative for CSF and/or blood. A 1-2 ml. volume of Isovue-250 was injected and flow of contrast was noted at each level. Radiographs were obtained for documentation purposes.   After attaining the desired flow of contrast documented above, a 0.5 to 1.0 ml test dose of 0.25% Marcaine was injected into each respective transforaminal space.  The patient was observed for 90 seconds post injection.  After no sensory deficits were reported, and normal lower extremity motor function was noted,   the above injectate was administered so that equal amounts of the injectate were placed at each foramen (level) into the transforaminal epidural space.   Additional Comments:  The patient tolerated the procedure well Dressing: Band-Aid    Post-procedure details: Patient was observed during the procedure. Post-procedure instructions were reviewed.  Patient left the clinic in stable condition.   Clinical History: IMPRESSION: 1. Severe scoliotic deformity of the thoracolumbar spine. 2. Degenerative changes as detailed above. Combination of disc bulges and degenerative facet hypertrophy, with accentuation by the scoliotic configuration, are causing moderate to severe neural foramen stenoses at the L1-2 through L4-5 levels with probable associated nerve root impingements. This is a likely source for patient's radiculopathic symptoms. This could be better characterized with lumbar spine MRI. 3. No acute findings.   Electronically Signed By: Candace Ross M.D. On: 10/14/2015 17:31  She reports that  she has never smoked. She has never used smokeless tobacco. No results for input(s): HGBA1C, LABURIC in the last 8760 hours.  Objective:  VS:  HT:    WT:   BMI:     BP:(!) 142/59  HR:74bpm  TEMP: ( )  RESP:97 % Physical Exam  Musculoskeletal:  Patient ambulates with a cane. She has good distal strength.    Ortho Exam Imaging: Xr C-arm No Report  Result Date: 12/06/2016 Please see Notes or Procedures tab for imaging impression.   Past Medical/Family/Surgical/Social History: Medications & Allergies reviewed per EMR Patient Active Problem List   Diagnosis Date Noted  . Unstable angina pectoris ([place]) 01/21/2016  . CAD (coronary artery disease), native coronary artery 12/18/2014  . Angina pectoris ([place]) 12/17/2014  . Cancer of central portion of female breast ([place]) 10/20/2011   Past Medical History:  Diagnosis Date  . Anemia   . Back pain   . Breast cancer ([place])   . Change in stool habits   . Dribbling urine   . H/O bone density study 2009  . Hearing loss   . Hypertension   . Osteoporosis   . Sinus problem   . SOB (shortness of breath) on exertion    stairs  . Spinal stenosis   . Thyroid disease   . Wears dentures    Family History  Problem Relation Age of Onset  .  Colon cancer Brother    Past Surgical History:  Procedure Laterality Date  . BARTHOLIN GLAND CYST EXCISION  1969  . BREAST LUMPECTOMY  05/02/77   benign  . CARDIAC CATHETERIZATION N/A 12/18/2014   Procedure: Left Heart Cath and Coronary Angiography;  Surgeon: Adrian Prows, MD;  Location: [facility];  Service: Cardiovascular;  Laterality: N/A;  . CARDIAC CATHETERIZATION N/A 12/18/2014   Procedure: Coronary Stent Intervention;  Surgeon: Adrian Prows, MD;  Location: [facility];  Service: Cardiovascular;  Laterality: N/A;  . HERNIA REPAIR  12/15/99   Social History   Occupational History  . Not on file.   Social History Main Topics  . Smoking status: Never Smoker  . Smokeless tobacco: Never  Used  . Alcohol use No  . Drug use: No  . Sexual activity: Not on file

## 2016-12-06 NOTE — Procedures (Signed)
Lumbosacral Transforaminal Epidural Steroid Injection - Infraneural Approach with Fluoroscopic Guidance  Patient: Candace Ross      Date of Birth: [birth year] MRN: [id number] PCP: Leanna Battles, MD      Visit Date: 12/06/2016   Universal Protocol:     Consent Given By: the patient  Position: PRONE   Additional Comments: Vital signs were monitored before and after the procedure. Patient was prepped and draped in the usual sterile fashion. The correct patient, procedure, and site was verified.   Injection Procedure Details:  Procedure Site One Meds Administered:  Meds ordered this encounter  Medications  . lidocaine (PF) (XYLOCAINE) 1 % injection 2 mL  . methylPREDNISolone acetate (DEPO-MEDROL) injection 80 mg      Laterality: Right  Location/Site:  L5-S1  Needle size: 22 G  Needle type: Spinal  Needle Placement: Transforaminal  Findings:  -Contrast Used: 1 mL iohexol 180 mg iodine/mL   -Comments: Excellent flow of contrast along the nerve and into the epidural space.  Procedure Details: After squaring off the end-plates of the desired vertebral level to get a true AP view, the C-arm was obliqued to the painful side so that the superior articulating process is positioned about 1/3 the length of the inferior endplate.  The needle was aimed toward the junction of the superior articular process and the transverse process of the inferior vertebrae. The needle's initial entry is in the lower third of the foramen through Kambin's triangle. The soft tissues overlying this target were infiltrated with 2-3 ml. of 1% Lidocaine without Epinephrine.  The spinal needle was then inserted and advanced toward the target using a "trajectory" view along the fluoroscope beam.  Under AP and lateral visualization, the needle was advanced so it did not puncture dura and did not traverse medially beyond the 6 o'clock position of the pedicle. Bi-planar projections were used to confirm  position. Aspiration was confirmed to be negative for CSF and/or blood. A 1-2 ml. volume of Isovue-250 was injected and flow of contrast was noted at each level. Radiographs were obtained for documentation purposes.   After attaining the desired flow of contrast documented above, a 0.5 to 1.0 ml test dose of 0.25% Marcaine was injected into each respective transforaminal space.  The patient was observed for 90 seconds post injection.  After no sensory deficits were reported, and normal lower extremity motor function was noted,   the above injectate was administered so that equal amounts of the injectate were placed at each foramen (level) into the transforaminal epidural space.   Additional Comments:  The patient tolerated the procedure well Dressing: Band-Aid    Post-procedure details: Patient was observed during the procedure. Post-procedure instructions were reviewed.  Patient left the clinic in stable condition.

## 2016-12-06 NOTE — Patient Instructions (Signed)

## 2016-12-12 ENCOUNTER — Encounter (INDEPENDENT_AMBULATORY_CARE_PROVIDER_SITE_OTHER): Payer: Medicare HMO | Admitting: Physical Medicine and Rehabilitation

## 2016-12-14 ENCOUNTER — Telehealth (INDEPENDENT_AMBULATORY_CARE_PROVIDER_SITE_OTHER): Payer: Self-pay | Admitting: Physical Medicine and Rehabilitation

## 2016-12-14 NOTE — Telephone Encounter (Signed)
We can ov two weeks from injection but her symptoms seem ok if she is worried much OV sooner than later

## 2016-12-14 NOTE — Telephone Encounter (Signed)
Called patient and left message.

## 2016-12-15 ENCOUNTER — Telehealth (INDEPENDENT_AMBULATORY_CARE_PROVIDER_SITE_OTHER): Payer: Self-pay | Admitting: Radiology

## 2016-12-15 MED ORDER — ACETAMINOPHEN-CODEINE #3 300-30 MG PO TABS: 1.0000 | ORAL_TABLET | Freq: Three times a day (TID) | ORAL | 0 refills | Status: DC | PRN

## 2016-12-15 NOTE — Telephone Encounter (Signed)
[facility] for fax or call in tylenol#3 for short duration.

## 2016-12-15 NOTE — Telephone Encounter (Signed)
Pt called this morning and left vm stating she was confused on what she needed to do from here. Tried to call pt back to discuss and got vm. Left message for her to call us back to schedule OV.

## 2016-12-15 NOTE — Telephone Encounter (Signed)
Marion Macaluso left VM requesting pain medication, stating the tylenol is not helping.  Please advise.

## 2016-12-15 NOTE — Telephone Encounter (Signed)
Done

## 2016-12-15 NOTE — Telephone Encounter (Signed)
rx printed please fax or call in

## 2016-12-18 NOTE — Telephone Encounter (Signed)
Pt left another message stating to just call her back with an appointment time. Made an appt for 12/28/16 and left vm with info.

## 2016-12-20 DIAGNOSIS — H401131 Primary open-angle glaucoma, bilateral, mild stage: Secondary | ICD-10-CM | POA: Diagnosis not present

## 2016-12-22 DIAGNOSIS — E039 Hypothyroidism, unspecified: Secondary | ICD-10-CM | POA: Diagnosis not present

## 2016-12-22 DIAGNOSIS — Z6826 Body mass index (BMI) 26.0-26.9, adult: Secondary | ICD-10-CM | POA: Diagnosis not present

## 2016-12-22 DIAGNOSIS — R600 Localized edema: Secondary | ICD-10-CM | POA: Diagnosis not present

## 2016-12-22 DIAGNOSIS — M79672 Pain in left foot: Secondary | ICD-10-CM | POA: Diagnosis not present

## 2016-12-25 DIAGNOSIS — D649 Anemia, unspecified: Secondary | ICD-10-CM | POA: Diagnosis not present

## 2016-12-28 ENCOUNTER — Ambulatory Visit (INDEPENDENT_AMBULATORY_CARE_PROVIDER_SITE_OTHER): Payer: Medicare HMO | Admitting: Physical Medicine and Rehabilitation

## 2017-01-11 ENCOUNTER — Telehealth (INDEPENDENT_AMBULATORY_CARE_PROVIDER_SITE_OTHER): Payer: Self-pay

## 2017-01-11 ENCOUNTER — Ambulatory Visit (INDEPENDENT_AMBULATORY_CARE_PROVIDER_SITE_OTHER): Payer: Medicare HMO | Admitting: Physical Medicine and Rehabilitation

## 2017-01-11 ENCOUNTER — Encounter (INDEPENDENT_AMBULATORY_CARE_PROVIDER_SITE_OTHER): Payer: Self-pay | Admitting: Physical Medicine and Rehabilitation

## 2017-01-11 VITALS — BP 129/56 | HR 73

## 2017-01-11 DIAGNOSIS — M48062 Spinal stenosis, lumbar region with neurogenic claudication: Secondary | ICD-10-CM

## 2017-01-11 DIAGNOSIS — M419 Scoliosis, unspecified: Secondary | ICD-10-CM | POA: Diagnosis not present

## 2017-01-11 DIAGNOSIS — M5416 Radiculopathy, lumbar region: Secondary | ICD-10-CM

## 2017-01-11 NOTE — Telephone Encounter (Signed)
Needs [employer] precert for Rt L4 TF

## 2017-01-11 NOTE — Progress Notes (Deleted)
Patient states she had minimal to no relief with last injection. Right hip pain down leg to foot. Says its an achy pain. No numbness and tingling.

## 2017-01-12 NOTE — Telephone Encounter (Signed)
Faxed anticoag d/c letter to Dr. Einar Gip office. Waiting on office note so I can work on precert.

## 2017-01-15 ENCOUNTER — Encounter (INDEPENDENT_AMBULATORY_CARE_PROVIDER_SITE_OTHER): Payer: Self-pay | Admitting: Physical Medicine and Rehabilitation

## 2017-01-15 NOTE — Progress Notes (Signed)
ADALIAH HIEGEL - 83 y.o. female MRN [id number]  Date of birth: 23-Aug-1926  Office Visit Note: Visit Date: 01/11/2017 PCP: Leanna Battles, MD Referred by: Leanna Battles, MD  Subjective: Chief Complaint  Patient presents with  . Lower Back - Pain   HPI: Mrs. Bina is a very pleasant 83 year old female that we have seen off and on over the last couple years. She has an extensive degenerative scoliotic spine. When we initially saw her she was having radicular type complaints that did do exceedingly well with epidural injection which gave her many months of relief. Unfortunately over the last 2-3 months we have completed 2 transforaminal injections at L5 on the right without good relief. Her symptoms are such that she gets a severe burning pain as well as a deep achy toothache-like pain in the left hip and leg. This is more anterior lateral posterior lateral to the foot. She denies any tingling or paresthesia however. It does seem radicular. Is worse with standing and better at rest. She does ambulate with a cane. She's had difficult time with medication intolerances. She could not tolerate tramadol but does use some Tylenol 3. She uses mostly Tylenol in general. She has had CT of the lumbar spine in 2017 which is reviewed again below. Last injection showed very good flow of contrast in the L5 nerve root pattern and epidurally. She has extensive foraminal stenosis and facet joint collapse of L4 on L5. This is on the right. She's had physical therapy in the past. She does try to stay active. Her case is complicated by use of Plavix anticoagulation for coronary artery disease and she also has a history of breast cancer.    Review of Systems  Constitutional: Negative for chills, fever, malaise/fatigue and weight loss.  HENT: Negative for hearing loss and sinus pain.   Eyes: Negative for blurred vision, double vision and photophobia.  Respiratory: Negative for cough and shortness of breath.     Cardiovascular: Negative for chest pain, palpitations and leg swelling.  Gastrointestinal: Negative for abdominal pain, nausea and vomiting.  Genitourinary: Negative for flank pain.  Musculoskeletal: Positive for back pain. Negative for myalgias.       Right hip and leg pain  Skin: Negative for itching and rash.  Neurological: Negative for tremors, focal weakness and weakness.  Endo/Heme/Allergies: Negative.   Psychiatric/Behavioral: Negative for depression.  All other systems reviewed and are negative.  Otherwise per HPI.  Assessment & Plan: Visit Diagnoses:  1. Lumbar radiculopathy   2. Spinal stenosis of lumbar region with neurogenic claudication   3. Scoliosis of thoracolumbar spine, unspecified scoliosis type     Plan: Findings:  Chronic worsening severe especially when trying to stand and walk of right hip and leg pain consistent with a radicular type pain. She has extensive scoliosis and degenerative facet arthropathy and foraminal stenosis. Her symptoms have seemed to be mostly L5 type dermatomal symptoms but L5 injection has not been beneficial. The injection has been well-placed in [place] there was good flow of contrast in this area. The next step at least one time diagnostically as an L4 transforaminal epidural injection. If that gives her relief and we can get her bile several months of repeating that off and on that would be a good option for her from a medical standpoint. Unfortunately she is on Plavix and that becomes an issue and she also has some issues with her eyes and retinopathy. I would request from her insurance company one more  diagnostic transforaminal epidural steroid injection but at a different level which would be L4. I'm hopeful to get good relief of her symptoms at least for a good while doing this. If this doesn't help we would seek out more medication type changes. She likely does not represent a good surgical case and does not want surgery. Her options are  becoming fairly limited. We couldn't update MRI as well but CT scan was performed last year.    Meds & Orders: No orders of the defined types were placed in this encounter.  No orders of the defined types were placed in this encounter.   Follow-up: Return for Right L4 transforaminal epidural steroid injection diagnostically.   Procedures: No procedures performed  No notes on file   Clinical History: IMPRESSION: 1. Severe scoliotic deformity of the thoracolumbar spine. 2. Degenerative changes as detailed above. Combination of disc bulges and degenerative facet hypertrophy, with accentuation by the scoliotic configuration, are causing moderate to severe neural foramen stenoses at the L1-2 through L4-5 levels with probable associated nerve root impingements. This is a likely source for patient's radiculopathic symptoms. This could be better characterized with lumbar spine MRI. 3. No acute findings.   Electronically Signed By: Franki Cabot M.D. On: 10/14/2015 17:31  She reports that she has never smoked. She has never used smokeless tobacco. No results for input(s): HGBA1C, LABURIC in the last 8760 hours.  Objective:  VS:  HT:    WT:   BMI:     BP:(!) 129/56  HR:73bpm  TEMP: ( )  RESP:  Physical Exam  Constitutional: She is oriented to person, place, and time. She appears well-developed and well-nourished.  Eyes: Pupils are equal, round, and reactive to light. Conjunctivae and EOM are normal.  Cardiovascular: Normal rate and intact distal pulses.   Pulmonary/Chest: Effort normal.  Musculoskeletal:  Lumbar spine shows rightward scoliosis with slightly higher iliac crest on the right than left. She has no pain with hip rotation. She has good distal strength without clonus. She has no pain over the greater trochanters.  Neurological: She is alert and oriented to person, place, and time. She exhibits normal muscle tone.  Skin: Skin is warm and dry. No rash noted. No erythema.    Psychiatric: She has a normal mood and affect. Her behavior is normal.  Nursing note and vitals reviewed.   Ortho Exam Imaging: No results found.  Past Medical/Family/Surgical/Social History: Medications & Allergies reviewed per EMR Patient Active Problem List   Diagnosis Date Noted  . Unstable angina pectoris ([place]) 01/21/2016  . CAD (coronary artery disease), native coronary artery 12/18/2014  . Angina pectoris ([place]) 12/17/2014  . Cancer of central portion of female breast ([place]) 10/20/2011   Past Medical History:  Diagnosis Date  . Anemia   . Back pain   . Breast cancer ([place])   . Change in stool habits   . Dribbling urine   . H/O bone density study 2009  . Hearing loss   . Hypertension   . Osteoporosis   . Sinus problem   . SOB (shortness of breath) on exertion    stairs  . Spinal stenosis   . Thyroid disease   . Wears dentures    Family History  Problem Relation Age of Onset  . Colon cancer Brother    Past Surgical History:  Procedure Laterality Date  . BARTHOLIN GLAND CYST EXCISION  1969  . BREAST LUMPECTOMY  05/02/77   benign  . CARDIAC CATHETERIZATION N/A 12/18/2014  Procedure: Left Heart Cath and Coronary Angiography;  Surgeon: Adrian Prows, MD;  Location: [facility];  Service: Cardiovascular;  Laterality: N/A;  . CARDIAC CATHETERIZATION N/A 12/18/2014   Procedure: Coronary Stent Intervention;  Surgeon: Adrian Prows, MD;  Location: [facility];  Service: Cardiovascular;  Laterality: N/A;  . HERNIA REPAIR  12/15/99   Social History   Occupational History  . Not on file.   Social History Main Topics  . Smoking status: Never Smoker  . Smokeless tobacco: Never Used  . Alcohol use No  . Drug use: No  . Sexual activity: Not on file

## 2017-01-16 NOTE — Telephone Encounter (Signed)
Faxed auth form with last 3 office notes to [phone]

## 2017-01-23 ENCOUNTER — Encounter (INDEPENDENT_AMBULATORY_CARE_PROVIDER_SITE_OTHER): Payer: Self-pay | Admitting: Physical Medicine and Rehabilitation

## 2017-01-23 ENCOUNTER — Ambulatory Visit (INDEPENDENT_AMBULATORY_CARE_PROVIDER_SITE_OTHER): Payer: Medicare HMO | Admitting: Physical Medicine and Rehabilitation

## 2017-01-23 ENCOUNTER — Ambulatory Visit (INDEPENDENT_AMBULATORY_CARE_PROVIDER_SITE_OTHER): Payer: Medicare HMO

## 2017-01-23 VITALS — BP 137/65 | HR 76

## 2017-01-23 DIAGNOSIS — M5416 Radiculopathy, lumbar region: Secondary | ICD-10-CM | POA: Diagnosis not present

## 2017-01-23 DIAGNOSIS — M419 Scoliosis, unspecified: Secondary | ICD-10-CM | POA: Diagnosis not present

## 2017-01-23 DIAGNOSIS — M48062 Spinal stenosis, lumbar region with neurogenic claudication: Secondary | ICD-10-CM | POA: Diagnosis not present

## 2017-01-23 MED ORDER — BETAMETHASONE SOD PHOS & ACET 6 (3-3) MG/ML IJ SUSP
12.0000 mg | Freq: Once | INTRAMUSCULAR | Status: AC
  Administered 2017-01-23: 12 mg

## 2017-01-23 MED ORDER — LIDOCAINE HCL (PF) 1 % IJ SOLN
2.0000 mL | Freq: Once | INTRAMUSCULAR | Status: AC
  Administered 2017-01-23: 2 mL

## 2017-01-23 NOTE — Progress Notes (Deleted)
Patient is here for planned right L4 transforaminal injection. No change in symptoms.

## 2017-01-23 NOTE — Patient Instructions (Signed)

## 2017-01-26 NOTE — Procedures (Signed)
Candace Ross is a very pleasant 83 year old female with right severe radicular pain which is been unremittent to other treatments. She has collapsing scoliosis particularly at L4-5 with foraminal narrowing at L4. L5 transforaminal injection was beneficial for very short-term. We'll try an L4 transforaminal injection diagnostically. The injection  will be diagnostic and hopefully therapeutic. The patient has failed conservative care including time, medications and activity modification.  Lumbosacral Transforaminal Epidural Steroid Injection - Sub-Pedicular Approach with Fluoroscopic Guidance  Patient: Candace Ross      Date of Birth: 04/02/1927 MRN: [id number] PCP: Leanna Battles, MD      Visit Date: 01/23/2017   Universal Protocol:    Date/Time: 01/23/2017  Consent Given By: the patient  Position: PRONE  Additional Comments: Vital signs were monitored before and after the procedure. Patient was prepped and draped in the usual sterile fashion. The correct patient, procedure, and site was verified.   Injection Procedure Details:  Procedure Site One Meds Administered:  Meds ordered this encounter  Medications  . lidocaine (PF) (XYLOCAINE) 1 % injection 2 mL  . betamethasone acetate-betamethasone sodium phosphate (CELESTONE) injection 12 mg    Laterality: Right  Location/Site:  L4-L5  Needle size: 22 G  Needle type: Spinal  Needle Placement: Transforaminal  Findings:  -Contrast Used: 1 mL iohexol 180 mg iodine/mL   -Comments: Good flow of contrast around the nerve itself very tight neuro foramen with scoliosis and very tough angle to get epidurally at this level.  Procedure Details: After squaring off the end-plates to get a true AP view, the C-arm was positioned so that an oblique view of the foramen as noted above was visualized. The target area is just inferior to the "nose of the scotty dog" or sub pedicular. The soft tissues overlying this structure were  infiltrated with 2-3 ml. of 1% Lidocaine without Epinephrine.  The spinal needle was inserted toward the target using a "trajectory" view along the fluoroscope beam.  Under AP and lateral visualization, the needle was advanced so it did not puncture dura and was located close the 6 O'Clock position of the pedical in AP tracterory. Biplanar projections were used to confirm position. Aspiration was confirmed to be negative for CSF and/or blood. A 1-2 ml. volume of Isovue-250 was injected and flow of contrast was noted at each level. Radiographs were obtained for documentation purposes.   After attaining the desired flow of contrast documented above, a 0.5 to 1.0 ml test dose of 0.25% Marcaine was injected into each respective transforaminal space.  The patient was observed for 90 seconds post injection.  After no sensory deficits were reported, and normal lower extremity motor function was noted,   the above injectate was administered so that equal amounts of the injectate were placed at each foramen (level) into the transforaminal epidural space.   Additional Comments:  The patient tolerated the procedure well Dressing: Band-Aid    Post-procedure details: Patient was observed during the procedure. Post-procedure instructions were reviewed.  Patient left the clinic in stable condition.

## 2017-01-30 ENCOUNTER — Telehealth (INDEPENDENT_AMBULATORY_CARE_PROVIDER_SITE_OTHER): Payer: Self-pay | Admitting: Physical Medicine and Rehabilitation

## 2017-01-30 NOTE — Telephone Encounter (Signed)
Ov to start/discuss medication mgt and other avenues

## 2017-01-31 NOTE — Telephone Encounter (Signed)
Patient wanted me to schedule her for a late morning appointment and cal and leave date/time in a message. I scheduled her for 02/13/17 at 1030. I tried to call to leave message, but no answer and no voicemail option.

## 2017-01-31 NOTE — Telephone Encounter (Signed)
Left message asking patient to call back to schedule an OV.

## 2017-02-01 NOTE — Telephone Encounter (Signed)
Left message for patient to notify her of appointment as requested.

## 2017-02-13 ENCOUNTER — Ambulatory Visit (INDEPENDENT_AMBULATORY_CARE_PROVIDER_SITE_OTHER): Payer: Medicare HMO | Admitting: Physical Medicine and Rehabilitation

## 2017-02-13 ENCOUNTER — Encounter (INDEPENDENT_AMBULATORY_CARE_PROVIDER_SITE_OTHER): Payer: Self-pay | Admitting: Physical Medicine and Rehabilitation

## 2017-02-13 VITALS — BP 134/58 | HR 71

## 2017-02-13 DIAGNOSIS — M48062 Spinal stenosis, lumbar region with neurogenic claudication: Secondary | ICD-10-CM | POA: Diagnosis not present

## 2017-02-13 DIAGNOSIS — M5416 Radiculopathy, lumbar region: Secondary | ICD-10-CM | POA: Diagnosis not present

## 2017-02-13 DIAGNOSIS — M419 Scoliosis, unspecified: Secondary | ICD-10-CM | POA: Diagnosis not present

## 2017-02-13 NOTE — Progress Notes (Signed)
Has had no relief so far from last 3 injections. Feels like pain has gotten worse.

## 2017-02-14 ENCOUNTER — Telehealth (INDEPENDENT_AMBULATORY_CARE_PROVIDER_SITE_OTHER): Payer: Self-pay | Admitting: Physical Medicine and Rehabilitation

## 2017-02-14 ENCOUNTER — Other Ambulatory Visit (INDEPENDENT_AMBULATORY_CARE_PROVIDER_SITE_OTHER): Payer: Self-pay | Admitting: Physical Medicine and Rehabilitation

## 2017-02-14 MED ORDER — GABAPENTIN 100 MG PO CAPS: ORAL_CAPSULE | ORAL | 0 refills | Status: DC

## 2017-02-14 NOTE — Telephone Encounter (Signed)
I had to write a longer description of the taper for the gabapentin. Please have this [facility] called in.

## 2017-02-16 NOTE — Telephone Encounter (Signed)
Faxed

## 2017-02-20 ENCOUNTER — Telehealth (INDEPENDENT_AMBULATORY_CARE_PROVIDER_SITE_OTHER): Payer: Self-pay | Admitting: Physical Medicine and Rehabilitation

## 2017-02-20 NOTE — Telephone Encounter (Signed)
Dr. Philip Aspen will be a good resource for her, I think sticking witht he gabapentin is worthwhile for now, depending on outcome will look at interlaminar esi

## 2017-02-21 NOTE — Telephone Encounter (Signed)
Patient called back and wants to keep appointment as it is now.

## 2017-02-21 NOTE — Telephone Encounter (Signed)
Left message

## 2017-02-22 ENCOUNTER — Encounter (INDEPENDENT_AMBULATORY_CARE_PROVIDER_SITE_OTHER): Payer: Self-pay | Admitting: Physical Medicine and Rehabilitation

## 2017-02-22 NOTE — Progress Notes (Signed)
Candace Ross - 83 y.o. female MRN [id number]  Date of birth: 20-Oct-1926  Office Visit Note: Visit Date: 02/13/2017 PCP: Leanna Battles, MD Referred by: Leanna Battles, MD  Subjective: Chief Complaint  Patient presents with  . Lower Back - Pain   HPI: Candace Ross is a very pleasant 83 year old female that I know quite well over the last few years. Initially seen her she had more radicular complaints that were amenable to epidural injection. Most recently she's had right radicular pain and a pretty classic L5 distribution but somewhat mixed. This is worse with standing and ambulating and better at rest. Medications have not been too helpful and she really does not tolerate certain medications. She has been using some tramadol and some Tylenol 3 on occasion but does not tolerate them well. We have tried 3 epidural injections. 2 L5 transforaminal injections both with fairly good flow of contrast but with no relief. Last injection was in L4 transforaminal injection with really difficult time getting into the foramen. She has a severe scoliotic deformity which is thoracolumbar. The L4 level is very tight from a foraminal standpoint. Luckily she does not have much in the way of central canal stenosis. She continues to have right radicular pain with paresthesia. No focal weakness. No bowel bladder discomfort. Her case is complicated by history of breast cancer as well as coronary artery disease and macular degeneration. She denies any right-sided complaints or new trauma. She does not remember in the past taking any medicine specifically for neuropathic pain such as gabapentin or appear may or tricyclic medications.    Review of Systems  Constitutional: Negative for chills, fever, malaise/fatigue and weight loss.  HENT: Negative for hearing loss and sinus pain.   Eyes: Negative for blurred vision, double vision and photophobia.  Respiratory: Negative for cough and shortness of breath.     Cardiovascular: Negative for chest pain, palpitations and leg swelling.  Gastrointestinal: Negative for abdominal pain, nausea and vomiting.  Genitourinary: Negative for flank pain.  Musculoskeletal: Positive for back pain and joint pain. Negative for myalgias.  Skin: Negative for itching and rash.  Neurological: Positive for tingling and weakness. Negative for tremors and focal weakness.  Endo/Heme/Allergies: Negative.   Psychiatric/Behavioral: Negative for depression.  All other systems reviewed and are negative.  Otherwise per HPI.  Assessment & Plan: Visit Diagnoses:  1. Lumbar radiculopathy   2. Spinal stenosis of lumbar region with neurogenic claudication   3. Scoliosis of thoracolumbar spine, unspecified scoliosis type     Plan: Findings:  Chronic worsening severe right radicular leg pain and mostly in L5 distribution with somewhat mixed. This is very consistent with a stenosis type pattern towards worse with standing and better at rest and sitting. She really has not responded well to transforaminal epidural steroid injections. One avenue of helped could be an intralaminar epidural steroid injection. She has been on anticoagulation. We've done 3 injections in a really want to subject her to more steroid medication at this point. There wasn't great flow of contrast on the L4 transforaminal injection is a very difficult angle to try to get into the foramen. The foramen is also very stenotic. She is probably not a great surgical candidate 23 with significant scoliotic deformity and other health issues. We want to start gabapentin slowly at 100 mg and do a slow taper. I told her this is not going to help at first but to hopefully give her some relief point forward is a good in result  that may help in general. She could also use some of the pain medication as needed. She doesn't really like the idea of strong pain medication in general. She can continue with Tylenol as well. We discussed the  limits of Tylenol in terms of amount of medication that can be taken safely. We spoke for 30 minutes on alternative treatments and steps going forward. We also talked about scoliosis and stenosis and radiculitis. Depending on how she does with the gabapentin we AGAIN tried different medication. Ultimately I would like to try an intralaminar approach is to see if that would help. She will continue to follow with Dr. Philip Aspen who can help from a medication standpoint as well.    Meds & Orders: No orders of the defined types were placed in this encounter.  No orders of the defined types were placed in this encounter.   Follow-up: Return if symptoms worsen or fail to improve.   Procedures: No procedures performed  No notes on file   Clinical History: IMPRESSION: 1. Severe scoliotic deformity of the thoracolumbar spine. 2. Degenerative changes as detailed above. Combination of disc bulges and degenerative facet hypertrophy, with accentuation by the scoliotic configuration, are causing moderate to severe neural foramen stenoses at the L1-2 through L4-5 levels with probable associated nerve root impingements. This is a likely source for patient's radiculopathic symptoms. This could be better characterized with lumbar spine MRI. 3. No acute findings.   Electronically Signed By: Franki Cabot M.D. On: 10/14/2015 17:31  She reports that she has never smoked. She has never used smokeless tobacco. No results for input(s): HGBA1C, LABURIC in the last 8760 hours.  Objective:  VS:  HT:    WT:   BMI:     BP:(!) 134/58  HR:71bpm  TEMP: ( )  RESP:  Physical Exam  Constitutional: She is oriented to person, place, and time. She appears well-developed and well-nourished. No distress.  HENT:  Head: Normocephalic and atraumatic.  Nose: Nose normal.  Mouth/Throat: Oropharynx is clear and moist.  Eyes: Pupils are equal, round, and reactive to light. Conjunctivae and EOM are normal.  Neck: Neck  supple. No tracheal deviation present.  Cardiovascular: Normal rate, regular rhythm and intact distal pulses.   Pulmonary/Chest: Effort normal. No respiratory distress.  Abdominal: She exhibits no distension. There is no guarding.  Musculoskeletal:  Patient ambulates slowly with a cane. She has trouble going from sit to stand. She has significant scoliotic deformity. She has some pain with extension of the lumbar spine. No pain over the greater trochanters. No pain with hip rotation. She has good distal strength without clonus.  Lymphadenopathy:    She has no cervical adenopathy.  Neurological: She is alert and oriented to person, place, and time. She exhibits normal muscle tone. Coordination normal.  Skin: Skin is warm and dry. No rash noted. No erythema.  Psychiatric: She has a normal mood and affect. Her behavior is normal.  Nursing note and vitals reviewed.   Ortho Exam Imaging: No results found.  Past Medical/Family/Surgical/Social History: Medications & Allergies reviewed per EMR Patient Active Problem List   Diagnosis Date Noted  . Spinal stenosis of lumbar region with neurogenic claudication 01/23/2017  . Scoliosis of thoracolumbar spine 01/23/2017  . Lumbar radiculopathy 01/23/2017  . Unstable angina pectoris ([place]) 01/21/2016  . CAD (coronary artery disease), native coronary artery 12/18/2014  . Angina pectoris ([place]) 12/17/2014  . Cancer of central portion of female breast ([place]) 10/20/2011   Past Medical History:  Diagnosis  Date  . Anemia   . Back pain   . Breast cancer ([place])   . Change in stool habits   . Dribbling urine   . H/O bone density study 2009  . Hearing loss   . Hypertension   . Osteoporosis   . Sinus problem   . SOB (shortness of breath) on exertion    stairs  . Spinal stenosis   . Thyroid disease   . Wears dentures    Family History  Problem Relation Age of Onset  . Colon cancer Brother    Past Surgical History:  Procedure Laterality Date  .  BARTHOLIN GLAND CYST EXCISION  1969  . BREAST LUMPECTOMY  05/02/77   benign  . CARDIAC CATHETERIZATION N/A 12/18/2014   Procedure: Left Heart Cath and Coronary Angiography;  Surgeon: Adrian Prows, MD;  Location: [facility];  Service: Cardiovascular;  Laterality: N/A;  . CARDIAC CATHETERIZATION N/A 12/18/2014   Procedure: Coronary Stent Intervention;  Surgeon: Adrian Prows, MD;  Location: [facility];  Service: Cardiovascular;  Laterality: N/A;  . HERNIA REPAIR  12/15/99   Social History   Occupational History  . Not on file.   Social History Main Topics  . Smoking status: Never Smoker  . Smokeless tobacco: Never Used  . Alcohol use No  . Drug use: No  . Sexual activity: Not on file

## 2017-03-07 ENCOUNTER — Telehealth (INDEPENDENT_AMBULATORY_CARE_PROVIDER_SITE_OTHER): Payer: Self-pay | Admitting: Physical Medicine and Rehabilitation

## 2017-03-07 ENCOUNTER — Ambulatory Visit (INDEPENDENT_AMBULATORY_CARE_PROVIDER_SITE_OTHER): Payer: Medicare HMO | Admitting: Physical Medicine and Rehabilitation

## 2017-03-07 ENCOUNTER — Encounter (INDEPENDENT_AMBULATORY_CARE_PROVIDER_SITE_OTHER): Payer: Self-pay | Admitting: Physical Medicine and Rehabilitation

## 2017-03-07 VITALS — BP 124/59 | HR 67

## 2017-03-07 DIAGNOSIS — M419 Scoliosis, unspecified: Secondary | ICD-10-CM | POA: Diagnosis not present

## 2017-03-07 DIAGNOSIS — M48062 Spinal stenosis, lumbar region with neurogenic claudication: Secondary | ICD-10-CM | POA: Diagnosis not present

## 2017-03-07 DIAGNOSIS — M792 Neuralgia and neuritis, unspecified: Secondary | ICD-10-CM

## 2017-03-07 DIAGNOSIS — M5416 Radiculopathy, lumbar region: Secondary | ICD-10-CM

## 2017-03-07 MED ORDER — DESIPRAMINE HCL 50 MG PO TABS: 50.0000 mg | ORAL_TABLET | Freq: Every day | ORAL | 1 refills | Status: DC

## 2017-03-07 NOTE — Progress Notes (Signed)
Candace Ross - 83 y.o. female MRN [id number]  Date of birth: 04/22/1927  Office Visit Note: Visit Date: 03/07/2017 PCP: Leanna Battles, MD Referred by: Leanna Battles, MD  Subjective: Chief Complaint  Patient presents with  . Lower Back - Pain  . Right Hip - Pain  . Right Lower Leg - Pain   HPI: Candace Ross is a pleasant 83 year old female who we've been treating for the last couple years with low back pain and radicular pain from lumbar scoliosis and stenosis particularly on the right foramen at L4 lateral recess. She started seeing [place] again a few months ago with recurrent right-sided leg pain. Has been very severe and worse with standing and ambulating and consistent with a stenosis pattern. L5 transforaminal injections did not give her much relief. Her pain was more of an L5 distribution. We did update  CT scan as well as x-rays showing foraminal collapse due to scoliosis and arthritis at L4 with L4 foraminal narrowing pretty severe. L4 injection was attempted but just could not reach the foramen because of the angle from the iliac crest and the narrowing of the foramen.  She is ambulating with a cane but does use alchair in the of She has difficulty standing for any length of time and this is a more recent the last many  Months.  wedid start her on gabapentin which has hellped during  day. Once  She started on 300 mg  She reported sedation during the day  The next day. She does have chronic kidney disease. And this may be an issue where she is getting a prolonged plasma level of the gabapentin since it is cleared by the kidneys.   She has used tramadol in the past but does not tolerate it very well. She is not using it currently. She is not tolerating opioids. She has diffuse Tylenol 3 with some relief. She does not have glaucoma but does have some macular degeneration.   She has had physical therapy.    Review of Systems  Constitutional: Negative for chills, fever, malaise/fatigue  and weight loss.  HENT: Negative for hearing loss and sinus pain.   Eyes: Negative for blurred vision, double vision and photophobia.  Respiratory: Negative for cough and shortness of breath.   Cardiovascular: Negative for chest pain, palpitations and leg swelling.  Gastrointestinal: Negative for abdominal pain, nausea and vomiting.  Genitourinary: Negative for flank pain.  Musculoskeletal: Positive for back pain and joint pain. Negative for myalgias.  Skin: Negative for itching and rash.  Neurological: Negative for tremors, focal weakness and weakness.  Endo/Heme/Allergies: Negative.   Psychiatric/Behavioral: Negative for depression.  All other systems reviewed and are negative.  Otherwise per HPI.  Assessment & Plan: Visit Diagnoses:  1. Lumbar radiculopathy   2. Spinal stenosis of lumbar region with neurogenic claudication   3. Scoliosis of thoracolumbar spine, unspecified scoliosis type   4. Neuropathic pain     Plan: Findings:   Combination of facet joint pain and radicular pain likely from the L4-5 region. She has severe foraminal stenosis and scoliosis was degenerative collapse. Prior transforaminal epidural steroid injection was not beneficial. I do think it's time to try an intralaminar approach at least one time to see if we get her some relief diagnostically and hopefully therapeutically. I also want to try desipramine 50 mg at night. This would be instead of the gabapentin. We gave her pentin at night and taking the desipramine. We did talk about the wrist and benefit  of this. I did also talk about her insurance may notr it but it should be a generic medication and should be safe for her to take a very low level. There are warnings now with trying to use these medications in the elderly. We are not using this as an antidepressant but as the pain medication. Depending on how the epidural injection does we would look in difent medication managemendoes not really represent a very good  surgical candidate but obviously having her see a spine surgeon for evaluation as a possibility.It would not be unheard of to look at spinal cord stimulator trial as well.  Prior to any intervention  I would update the MRI.    Meds & Orders:  Meds ordered this encounter  Medications  . desipramine (NORPRAMIN) 50 MG tablet    Sig: Take 1 tablet (50 mg total) by mouth at bedtime.    Dispense:  30 tablet    Refill:  1   No orders of the defined types were placed in this encounter.   Follow-up: No Follow-up on file.   Procedures: No procedures performed  No notes on file   Clinical History: CT scan lumbar spine  IMPRESSION: 1. Severe scoliotic deformity of the thoracolumbar spine. 2. Degenerative changes as detailed above. Combination of disc bulges and degenerative facet hypertrophy, with accentuation by the scoliotic configuration, are causing moderate to severe neural foramen stenoses at the L1-2 through L4-5 levels with probable associated nerve root impingements. This is a likely source for patient's radiculopathic symptoms. This could be better characterized with lumbar spine MRI. 3. No acute findings.   Electronically Signed By: Franki Cabot M.D. On: 10/14/2015 17:31  She reports that she has never smoked. She has never used smokeless tobacco. No results for input(s): HGBA1C, LABURIC in the last 8760 hours.  Objective:  VS:  HT:    WT:   BMI:     BP:(!) 124/59  HR:67bpm  TEMP: ( )  RESP:  Physical Exam  Constitutional: She is oriented to person, place, and time. She appears well-developed and well-nourished.  Eyes: Pupils are equal, round, and reactive to light. Conjunctivae and EOM are normal.  Cardiovascular: Normal rate and intact distal pulses.   Pulmonary/Chest: Effort normal.  Musculoskeletal:   She ambulates   Slowly with an antalgic gait to the right with a cane.she has lumbar scoliosis to the right on exam. She has pain with extension and rotation of the  lumbar spine. She has paraspinal spasms of the right. She has no pain over the greater trochanters. She has no pain with hip rotation. She has good distal strength without clonus.  Neurological: She is alert and oriented to person, place, and time. She exhibits normal muscle tone. Coordination normal.  Skin: Skin is warm and dry. No rash noted. No erythema.  Psychiatric: She has a normal mood and affect. Her behavior is normal.  Nursing note and vitals reviewed.   Ortho Exam Imaging: No results found.  Past Medical/Family/Surgical/Social History: Medications & Allergies reviewed per EMR Patient Active Problem List   Diagnosis Date Noted  . Spinal stenosis of lumbar region with neurogenic claudication 01/23/2017  . Scoliosis of thoracolumbar spine 01/23/2017  . Lumbar radiculopathy 01/23/2017  . Unstable angina pectoris ([place]) 01/21/2016  . CAD (coronary artery disease), native coronary artery 12/18/2014  . Angina pectoris ([place]) 12/17/2014  . Cancer of central portion of female breast ([place]) 10/20/2011   Past Medical History:  Diagnosis Date  . Anemia   .  Back pain   . Breast cancer ([place])   . Change in stool habits   . Dribbling urine   . H/O bone density study 2009  . Hearing loss   . Hypertension   . Osteoporosis   . Sinus problem   . SOB (shortness of breath) on exertion    stairs  . Spinal stenosis   . Thyroid disease   . Wears dentures    Family History  Problem Relation Age of Onset  . Colon cancer Brother    Past Surgical History:  Procedure Laterality Date  . BARTHOLIN GLAND CYST EXCISION  1969  . BREAST LUMPECTOMY  05/02/77   benign  . CARDIAC CATHETERIZATION N/A 12/18/2014   Procedure: Left Heart Cath and Coronary Angiography;  Surgeon: Adrian Prows, MD;  Location: [facility];  Service: Cardiovascular;  Laterality: N/A;  . CARDIAC CATHETERIZATION N/A 12/18/2014   Procedure: Coronary Stent Intervention;  Surgeon: Adrian Prows, MD;  Location: [facility];  Service: Cardiovascular;  Laterality: N/A;  . HERNIA REPAIR  12/15/99   Social History   Occupational History  . Not on file.   Social History Main Topics  . Smoking status: Never Smoker  . Smokeless tobacco: Never Used  . Alcohol use No  . Drug use: No  . Sexual activity: Not on file

## 2017-03-07 NOTE — Progress Notes (Deleted)
Has been taking Gabapentin. Taking more than 2 makes her too sleepy. Thought it was beginning to help a small amount until this morning. The pain has increased again this morning. Thinks this is because she had to stand too long. Has not been to chiropractor because of cost.

## 2017-03-08 ENCOUNTER — Telehealth (INDEPENDENT_AMBULATORY_CARE_PROVIDER_SITE_OTHER): Payer: Self-pay | Admitting: Physical Medicine and Rehabilitation

## 2017-03-08 NOTE — Telephone Encounter (Signed)
Waiting on office note

## 2017-03-08 NOTE — Telephone Encounter (Signed)
-----   Message from Adrian Prows, MD sent at 03/08/2017  8:14 AM EDT ----- Regarding: RE: Plavix Hold Plavix for 6 days then restart next day following procedure ----- Message ----- From: Sherre Scarlet, RT Sent: 03/07/2017   2:09 PM To: Adrian Prows, MD Subject: Plavix                                         Patient needs to schedule an epidural steroid injection with Dr. Ernestina Patches at [facility]. We need permission for her to hold Plavix for 7 days prior to this. Will this be ok?

## 2017-03-08 NOTE — Telephone Encounter (Signed)
-----   Message from Adrian Prows, MD sent at 03/08/2017  8:14 AM EDT ----- Regarding: RE: Plavix Hold Plavix for 6 days then restart next day following procedure ----- Message ----- From: Sherre Scarlet, RT Sent: 03/07/2017   2:09 PM To: Adrian Prows, MD Subject: Plavix                                         Patient needs to schedule an epidural steroid injection with Dr. Ernestina Patches at [facility]. We need permission for her to hold Plavix for 7 days prior to this. Will this be ok?

## 2017-03-08 NOTE — Telephone Encounter (Signed)
Received ok for patient to hold Plavix for 6 days prior to [facility]. Will await approval from [facility] to schedule right L4-5 [place].

## 2017-03-09 ENCOUNTER — Encounter (INDEPENDENT_AMBULATORY_CARE_PROVIDER_SITE_OTHER): Payer: Self-pay | Admitting: Physical Medicine and Rehabilitation

## 2017-03-12 NOTE — Telephone Encounter (Signed)
Faxed auth form with note to [employer]

## 2017-03-15 NOTE — Telephone Encounter (Signed)
Scheduled for 10/29 at 1430. Patient will hold Plavix for 6 days.

## 2017-03-15 NOTE — Telephone Encounter (Signed)
Ok to schedule pt now. Received auth for code [id number]. eff 03/12/17-04/26/17. Auth #[id number]. Entered referral so it will just need to be attached to appt once pt is scheduled.

## 2017-03-20 ENCOUNTER — Other Ambulatory Visit (INDEPENDENT_AMBULATORY_CARE_PROVIDER_SITE_OTHER): Payer: Self-pay | Admitting: Physical Medicine and Rehabilitation

## 2017-03-22 NOTE — Telephone Encounter (Signed)
Faxed

## 2017-03-22 NOTE — Telephone Encounter (Signed)
Please advise 

## 2017-03-26 ENCOUNTER — Encounter (INDEPENDENT_AMBULATORY_CARE_PROVIDER_SITE_OTHER): Payer: Self-pay | Admitting: Physical Medicine and Rehabilitation

## 2017-03-26 ENCOUNTER — Ambulatory Visit (INDEPENDENT_AMBULATORY_CARE_PROVIDER_SITE_OTHER): Payer: Medicare HMO | Admitting: Physical Medicine and Rehabilitation

## 2017-03-26 ENCOUNTER — Ambulatory Visit (INDEPENDENT_AMBULATORY_CARE_PROVIDER_SITE_OTHER): Payer: Medicare HMO

## 2017-03-26 VITALS — BP 128/55 | HR 70 | Temp 96.7°F

## 2017-03-26 DIAGNOSIS — M5416 Radiculopathy, lumbar region: Secondary | ICD-10-CM

## 2017-03-26 DIAGNOSIS — M419 Scoliosis, unspecified: Secondary | ICD-10-CM

## 2017-03-26 DIAGNOSIS — M48062 Spinal stenosis, lumbar region with neurogenic claudication: Secondary | ICD-10-CM

## 2017-03-26 MED ORDER — LIDOCAINE HCL (PF) 1 % IJ SOLN
2.0000 mL | Freq: Once | INTRAMUSCULAR | Status: AC
  Administered 2017-03-26: 2 mL

## 2017-03-26 MED ORDER — BETAMETHASONE SOD PHOS & ACET 6 (3-3) MG/ML IJ SUSP
12.0000 mg | Freq: Once | INTRAMUSCULAR | Status: AC
  Administered 2017-03-26: 12 mg

## 2017-03-26 NOTE — Progress Notes (Deleted)
Here for ESI injection of Lower back, hurts in the shoulder right side and down to rib cage right side. Right side is the worse side. Has some tingling on right side of leg "feels like a bug crawling" . Pain usually when walking or standing. Pt taking tylenol to help with pain. 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 Pt on plavix but has not taken in last 7 days.

## 2017-03-26 NOTE — Patient Instructions (Signed)

## 2017-03-27 NOTE — Procedures (Signed)
Candace Ross is a very pleasant 83 year old female that I have known over the last couple of years now with right radicular leg pain.  Her history is well-documented in our notes.  She came in today for planned L4-5 interlaminar injection because prior right transforaminal approach to the L4 foramen was really unsuccessful in felt like it was probably something that was just almost impossible to complete.  Her anatomy is such that she has significant scoliosis with rightward collapse at L4.  This combined with short stature and highly stress makes the angle of this approach very difficult.  She has been off her blood thinner for 7 days which is Plavix.  She continues to have right radicular pain more of an L4 or L5 distribution.  Interestingly as described below.  We did start out with an intralaminar approach and just due to the bony arthritis really could not complete a successful right sided interlaminar approach.  At that point I decided to try again and complete a right L4 transforaminal injection at least this time we did get better anatomy landmarks and a better angle and we actually were able to complete an L4 transforaminal epidural steroid injection.  Lumbosacral Transforaminal Epidural Steroid Injection - Sub-Pedicular Approach with Fluoroscopic Guidance  Patient: Candace Ross      Date of Birth: [birth year] MRN: [id number] PCP: Leanna Battles, MD      Visit Date: 03/26/2017   Universal Protocol:    Date/Time: 03/26/2017  Consent Given By: the patient  Position: PRONE  Additional Comments: Vital signs were monitored before and after the procedure. Patient was prepped and draped in the usual sterile fashion. The correct patient, procedure, and site was verified.   Injection Procedure Details:  Procedure Site One Meds Administered:  Meds ordered this encounter  Medications  . lidocaine (PF) (XYLOCAINE) 1 % injection 2 mL  . betamethasone acetate-betamethasone sodium  phosphate (CELESTONE) injection 12 mg    Laterality: Right  Location/Site:  L4-L5  Needle size: 22 G  Needle type: Spinal  Needle Placement: Transforaminal  Findings:  -Contrast Used: 0.5 mL iohexol 180 mg iodine/mL   -Comments: Excellent flow of contrast along the nerve and into the epidural space.  Procedure Details: After squaring off the end-plates to get a true AP view, the C-arm was positioned so that an oblique view of the foramen as noted above was visualized. The target area is just inferior to the "nose of the scotty dog" or sub pedicular. The soft tissues overlying this structure were infiltrated with 2-3 ml. of 1% Lidocaine without Epinephrine.  The spinal needle was inserted toward the target using a "trajectory" view along the fluoroscope beam.  Under AP and lateral visualization, the needle was advanced so it did not puncture dura and was located close the 6 O'Clock position of the pedical in AP tracterory. Biplanar projections were used to confirm position. Aspiration was confirmed to be negative for CSF and/or blood. A 1-2 ml. volume of Isovue-250 was injected and flow of contrast was noted at each level. Radiographs were obtained for documentation purposes.   After attaining the desired flow of contrast documented above, a 0.5 to 1.0 ml test dose of 0.25% Marcaine was injected into each respective transforaminal space.  The patient was observed for 90 seconds post injection.  After no sensory deficits were reported, and normal lower extremity motor function was noted,   the above injectate was administered so that equal amounts of the injectate were placed at each foramen (  level) into the transforaminal epidural space.   Additional Comments:  No complications occurred.  The injection itself today was somewhat eventful and that any needling into the muscular area today was painful for her which is really not something she usually has.  Nonetheless we were able to try an  interlaminar approach at first just were unable to get into the epidural space due to the bony stenosis and scoliosis and arthritis.  I did convert this to a transforaminal approach just to try to get her some relief because she has had this recalcitrant leg pain.  Serendipitously we were able to complete a very successful right L4 transforaminal injection. Dressing: Band-Aid    Post-procedure details: Patient was observed during the procedure. Post-procedure instructions were reviewed.  Patient left the clinic in stable condition.

## 2017-04-16 ENCOUNTER — Telehealth (INDEPENDENT_AMBULATORY_CARE_PROVIDER_SITE_OTHER): Payer: Self-pay | Admitting: Physical Medicine and Rehabilitation

## 2017-04-16 NOTE — Telephone Encounter (Signed)
I would say if leg pain is better then that was the goal, still can take small of pain meds etc and keep moving. Would just watch at this point

## 2017-04-17 NOTE — Telephone Encounter (Signed)
Called patient to advise.  Left message

## 2017-05-14 DIAGNOSIS — I10 Essential (primary) hypertension: Secondary | ICD-10-CM | POA: Diagnosis not present

## 2017-05-14 DIAGNOSIS — M810 Age-related osteoporosis without current pathological fracture: Secondary | ICD-10-CM | POA: Diagnosis not present

## 2017-05-14 DIAGNOSIS — E7849 Other hyperlipidemia: Secondary | ICD-10-CM | POA: Diagnosis not present

## 2017-05-14 DIAGNOSIS — E038 Other specified hypothyroidism: Secondary | ICD-10-CM | POA: Diagnosis not present

## 2017-05-24 DIAGNOSIS — Z6829 Body mass index (BMI) 29.0-29.9, adult: Secondary | ICD-10-CM | POA: Diagnosis not present

## 2017-05-24 DIAGNOSIS — Z0000 Encounter for general adult medical examination without abnormal findings: Secondary | ICD-10-CM | POA: Diagnosis not present

## 2017-07-24 DIAGNOSIS — Z1389 Encounter for screening for other disorder: Secondary | ICD-10-CM | POA: Diagnosis not present

## 2017-07-24 DIAGNOSIS — M545 Low back pain: Secondary | ICD-10-CM | POA: Diagnosis not present

## 2017-07-24 DIAGNOSIS — Z9861 Coronary angioplasty status: Secondary | ICD-10-CM | POA: Diagnosis not present

## 2017-07-24 DIAGNOSIS — E038 Other specified hypothyroidism: Secondary | ICD-10-CM | POA: Diagnosis not present

## 2017-07-24 DIAGNOSIS — I10 Essential (primary) hypertension: Secondary | ICD-10-CM | POA: Diagnosis not present

## 2017-07-24 DIAGNOSIS — Z6825 Body mass index (BMI) 25.0-25.9, adult: Secondary | ICD-10-CM | POA: Diagnosis not present

## 2017-09-24 DIAGNOSIS — H353133 Nonexudative age-related macular degeneration, bilateral, advanced atrophic without subfoveal involvement: Secondary | ICD-10-CM | POA: Diagnosis not present

## 2017-09-24 DIAGNOSIS — H401131 Primary open-angle glaucoma, bilateral, mild stage: Secondary | ICD-10-CM | POA: Diagnosis not present

## 2017-09-28 DIAGNOSIS — Z6825 Body mass index (BMI) 25.0-25.9, adult: Secondary | ICD-10-CM | POA: Diagnosis not present

## 2017-09-28 DIAGNOSIS — R05 Cough: Secondary | ICD-10-CM | POA: Diagnosis not present

## 2017-09-28 DIAGNOSIS — Z9861 Coronary angioplasty status: Secondary | ICD-10-CM | POA: Diagnosis not present

## 2017-09-28 DIAGNOSIS — R0602 Shortness of breath: Secondary | ICD-10-CM | POA: Diagnosis not present

## 2017-09-28 DIAGNOSIS — I10 Essential (primary) hypertension: Secondary | ICD-10-CM | POA: Diagnosis not present

## 2017-10-05 DIAGNOSIS — R0602 Shortness of breath: Secondary | ICD-10-CM | POA: Diagnosis not present

## 2017-10-05 DIAGNOSIS — Z6825 Body mass index (BMI) 25.0-25.9, adult: Secondary | ICD-10-CM | POA: Diagnosis not present

## 2017-10-05 DIAGNOSIS — I10 Essential (primary) hypertension: Secondary | ICD-10-CM | POA: Diagnosis not present

## 2017-10-05 DIAGNOSIS — R05 Cough: Secondary | ICD-10-CM | POA: Diagnosis not present

## 2017-10-05 DIAGNOSIS — N183 Chronic kidney disease, stage 3 (moderate): Secondary | ICD-10-CM | POA: Diagnosis not present

## 2017-10-25 DIAGNOSIS — E7849 Other hyperlipidemia: Secondary | ICD-10-CM | POA: Diagnosis not present

## 2017-10-25 DIAGNOSIS — Z6825 Body mass index (BMI) 25.0-25.9, adult: Secondary | ICD-10-CM | POA: Diagnosis not present

## 2017-10-25 DIAGNOSIS — Z9861 Coronary angioplasty status: Secondary | ICD-10-CM | POA: Diagnosis not present

## 2017-10-25 DIAGNOSIS — E038 Other specified hypothyroidism: Secondary | ICD-10-CM | POA: Diagnosis not present

## 2017-10-25 DIAGNOSIS — I10 Essential (primary) hypertension: Secondary | ICD-10-CM | POA: Diagnosis not present

## 2017-11-12 ENCOUNTER — Telehealth (INDEPENDENT_AMBULATORY_CARE_PROVIDER_SITE_OTHER): Payer: Self-pay | Admitting: *Deleted

## 2017-11-12 NOTE — Telephone Encounter (Signed)
Pt called wanting dates from previous inj.

## 2017-11-20 DIAGNOSIS — I10 Essential (primary) hypertension: Secondary | ICD-10-CM | POA: Diagnosis not present

## 2017-11-20 DIAGNOSIS — H3530 Unspecified macular degeneration: Secondary | ICD-10-CM | POA: Diagnosis not present

## 2017-11-20 DIAGNOSIS — Z9861 Coronary angioplasty status: Secondary | ICD-10-CM | POA: Diagnosis not present

## 2017-11-20 DIAGNOSIS — Z6825 Body mass index (BMI) 25.0-25.9, adult: Secondary | ICD-10-CM | POA: Diagnosis not present

## 2018-01-02 DIAGNOSIS — I25118 Atherosclerotic heart disease of native coronary artery with other forms of angina pectoris: Secondary | ICD-10-CM | POA: Diagnosis not present

## 2018-01-02 DIAGNOSIS — I10 Essential (primary) hypertension: Secondary | ICD-10-CM | POA: Diagnosis not present

## 2018-01-02 DIAGNOSIS — E7800 Pure hypercholesterolemia, unspecified: Secondary | ICD-10-CM | POA: Diagnosis not present

## 2018-01-02 DIAGNOSIS — R0989 Other specified symptoms and signs involving the circulatory and respiratory systems: Secondary | ICD-10-CM | POA: Diagnosis not present

## 2018-01-18 DIAGNOSIS — D3131 Benign neoplasm of right choroid: Secondary | ICD-10-CM | POA: Diagnosis not present

## 2018-01-18 DIAGNOSIS — H353114 Nonexudative age-related macular degeneration, right eye, advanced atrophic with subfoveal involvement: Secondary | ICD-10-CM | POA: Diagnosis not present

## 2018-01-18 DIAGNOSIS — Z961 Presence of intraocular lens: Secondary | ICD-10-CM | POA: Diagnosis not present

## 2018-01-18 DIAGNOSIS — H353221 Exudative age-related macular degeneration, left eye, with active choroidal neovascularization: Secondary | ICD-10-CM | POA: Diagnosis not present

## 2018-01-18 DIAGNOSIS — H5316 Psychophysical visual disturbances: Secondary | ICD-10-CM | POA: Diagnosis not present

## 2018-01-21 DIAGNOSIS — H3530 Unspecified macular degeneration: Secondary | ICD-10-CM | POA: Diagnosis not present

## 2018-01-21 DIAGNOSIS — I10 Essential (primary) hypertension: Secondary | ICD-10-CM | POA: Diagnosis not present

## 2018-01-21 DIAGNOSIS — Z23 Encounter for immunization: Secondary | ICD-10-CM | POA: Diagnosis not present

## 2018-01-21 DIAGNOSIS — Z9861 Coronary angioplasty status: Secondary | ICD-10-CM | POA: Diagnosis not present

## 2018-01-21 DIAGNOSIS — E038 Other specified hypothyroidism: Secondary | ICD-10-CM | POA: Diagnosis not present

## 2018-01-21 DIAGNOSIS — Z6825 Body mass index (BMI) 25.0-25.9, adult: Secondary | ICD-10-CM | POA: Diagnosis not present

## 2018-01-21 DIAGNOSIS — R21 Rash and other nonspecific skin eruption: Secondary | ICD-10-CM | POA: Diagnosis not present

## 2018-04-08 DIAGNOSIS — N183 Chronic kidney disease, stage 3 (moderate): Secondary | ICD-10-CM | POA: Diagnosis not present

## 2018-04-08 DIAGNOSIS — Z9861 Coronary angioplasty status: Secondary | ICD-10-CM | POA: Diagnosis not present

## 2018-04-08 DIAGNOSIS — I10 Essential (primary) hypertension: Secondary | ICD-10-CM | POA: Diagnosis not present

## 2018-04-08 DIAGNOSIS — M545 Low back pain: Secondary | ICD-10-CM | POA: Diagnosis not present

## 2018-04-08 DIAGNOSIS — H3530 Unspecified macular degeneration: Secondary | ICD-10-CM | POA: Diagnosis not present

## 2018-04-08 DIAGNOSIS — R6889 Other general symptoms and signs: Secondary | ICD-10-CM | POA: Diagnosis not present

## 2018-04-08 DIAGNOSIS — Z6825 Body mass index (BMI) 25.0-25.9, adult: Secondary | ICD-10-CM | POA: Diagnosis not present

## 2018-04-08 DIAGNOSIS — E038 Other specified hypothyroidism: Secondary | ICD-10-CM | POA: Diagnosis not present

## 2018-04-08 DIAGNOSIS — E7849 Other hyperlipidemia: Secondary | ICD-10-CM | POA: Diagnosis not present

## 2018-04-08 DIAGNOSIS — D6489 Other specified anemias: Secondary | ICD-10-CM | POA: Diagnosis not present

## 2018-05-15 DIAGNOSIS — H401131 Primary open-angle glaucoma, bilateral, mild stage: Secondary | ICD-10-CM | POA: Diagnosis not present

## 2018-05-15 DIAGNOSIS — R6889 Other general symptoms and signs: Secondary | ICD-10-CM | POA: Diagnosis not present

## 2018-05-23 DIAGNOSIS — M810 Age-related osteoporosis without current pathological fracture: Secondary | ICD-10-CM | POA: Diagnosis not present

## 2018-05-23 DIAGNOSIS — I10 Essential (primary) hypertension: Secondary | ICD-10-CM | POA: Diagnosis not present

## 2018-05-23 DIAGNOSIS — E038 Other specified hypothyroidism: Secondary | ICD-10-CM | POA: Diagnosis not present

## 2018-05-23 DIAGNOSIS — R6889 Other general symptoms and signs: Secondary | ICD-10-CM | POA: Diagnosis not present

## 2018-05-23 DIAGNOSIS — E7849 Other hyperlipidemia: Secondary | ICD-10-CM | POA: Diagnosis not present

## 2018-05-23 DIAGNOSIS — R82998 Other abnormal findings in urine: Secondary | ICD-10-CM | POA: Diagnosis not present

## 2018-08-17 ENCOUNTER — Other Ambulatory Visit: Payer: Self-pay | Admitting: Cardiology

## 2018-08-19 NOTE — Telephone Encounter (Signed)
Does this pt need to be on plavix she is asking for a refill

## 2018-08-19 NOTE — Telephone Encounter (Signed)
Looks like it was discontinued in August

## 2018-10-04 ENCOUNTER — Other Ambulatory Visit: Payer: Self-pay | Admitting: Cardiology

## 2018-10-07 NOTE — Telephone Encounter (Signed)
She is taking the 20mg 

## 2018-10-07 NOTE — Telephone Encounter (Signed)
Please fill

## 2018-11-29 ENCOUNTER — Emergency Department (HOSPITAL_COMMUNITY): Payer: Medicare PPO

## 2018-11-29 ENCOUNTER — Inpatient Hospital Stay (HOSPITAL_COMMUNITY)
Admission: EM | Admit: 2018-11-29 | Discharge: 2018-12-01 | DRG: 291 | Disposition: A | Discharge status: Home / Self Care | Payer: Medicare PPO | Attending: Cardiology | Admitting: Cardiology

## 2018-11-29 ENCOUNTER — Encounter (HOSPITAL_COMMUNITY): Payer: Self-pay

## 2018-11-29 ENCOUNTER — Other Ambulatory Visit: Payer: Self-pay

## 2018-11-29 DIAGNOSIS — Z209 Contact with and (suspected) exposure to unspecified communicable disease: Secondary | ICD-10-CM | POA: Diagnosis not present

## 2018-11-29 DIAGNOSIS — I2510 Atherosclerotic heart disease of native coronary artery without angina pectoris: Secondary | ICD-10-CM | POA: Diagnosis present

## 2018-11-29 DIAGNOSIS — Z7982 Long term (current) use of aspirin: Secondary | ICD-10-CM | POA: Diagnosis not present

## 2018-11-29 DIAGNOSIS — E7800 Pure hypercholesterolemia, unspecified: Secondary | ICD-10-CM

## 2018-11-29 DIAGNOSIS — I340 Nonrheumatic mitral (valve) insufficiency: Secondary | ICD-10-CM | POA: Diagnosis not present

## 2018-11-29 DIAGNOSIS — R0602 Shortness of breath: Secondary | ICD-10-CM | POA: Diagnosis present

## 2018-11-29 DIAGNOSIS — Z79890 Hormone replacement therapy (postmenopausal): Secondary | ICD-10-CM | POA: Diagnosis not present

## 2018-11-29 DIAGNOSIS — E785 Hyperlipidemia, unspecified: Secondary | ICD-10-CM | POA: Diagnosis not present

## 2018-11-29 DIAGNOSIS — Z7902 Long term (current) use of antithrombotics/antiplatelets: Secondary | ICD-10-CM | POA: Diagnosis not present

## 2018-11-29 DIAGNOSIS — I5033 Acute on chronic diastolic (congestive) heart failure: Secondary | ICD-10-CM | POA: Diagnosis not present

## 2018-11-29 DIAGNOSIS — J80 Acute respiratory distress syndrome: Secondary | ICD-10-CM | POA: Diagnosis not present

## 2018-11-29 DIAGNOSIS — H9190 Unspecified hearing loss, unspecified ear: Secondary | ICD-10-CM | POA: Diagnosis present

## 2018-11-29 DIAGNOSIS — R252 Cramp and spasm: Secondary | ICD-10-CM | POA: Diagnosis present

## 2018-11-29 DIAGNOSIS — M4800 Spinal stenosis, site unspecified: Secondary | ICD-10-CM | POA: Diagnosis present

## 2018-11-29 DIAGNOSIS — Z79899 Other long term (current) drug therapy: Secondary | ICD-10-CM

## 2018-11-29 DIAGNOSIS — I447 Left bundle-branch block, unspecified: Secondary | ICD-10-CM | POA: Diagnosis not present

## 2018-11-29 DIAGNOSIS — I130 Hypertensive heart and chronic kidney disease with heart failure and stage 1 through stage 4 chronic kidney disease, or unspecified chronic kidney disease: Secondary | ICD-10-CM | POA: Diagnosis not present

## 2018-11-29 DIAGNOSIS — Z800 Family history of malignant neoplasm of digestive organs: Secondary | ICD-10-CM

## 2018-11-29 DIAGNOSIS — E079 Disorder of thyroid, unspecified: Secondary | ICD-10-CM | POA: Diagnosis not present

## 2018-11-29 DIAGNOSIS — Z20828 Contact with and (suspected) exposure to other viral communicable diseases: Secondary | ICD-10-CM | POA: Diagnosis present

## 2018-11-29 DIAGNOSIS — R0982 Postnasal drip: Secondary | ICD-10-CM | POA: Diagnosis present

## 2018-11-29 DIAGNOSIS — Z955 Presence of coronary angioplasty implant and graft: Secondary | ICD-10-CM

## 2018-11-29 DIAGNOSIS — I110 Hypertensive heart disease with heart failure: Secondary | ICD-10-CM | POA: Diagnosis not present

## 2018-11-29 DIAGNOSIS — N183 Chronic kidney disease, stage 3 (moderate): Secondary | ICD-10-CM | POA: Diagnosis not present

## 2018-11-29 DIAGNOSIS — R0902 Hypoxemia: Secondary | ICD-10-CM | POA: Diagnosis not present

## 2018-11-29 DIAGNOSIS — Z853 Personal history of malignant neoplasm of breast: Secondary | ICD-10-CM

## 2018-11-29 DIAGNOSIS — R7989 Other specified abnormal findings of blood chemistry: Secondary | ICD-10-CM | POA: Diagnosis not present

## 2018-11-29 DIAGNOSIS — I5043 Acute on chronic combined systolic (congestive) and diastolic (congestive) heart failure: Secondary | ICD-10-CM | POA: Diagnosis present

## 2018-11-29 DIAGNOSIS — M5416 Radiculopathy, lumbar region: Secondary | ICD-10-CM | POA: Diagnosis present

## 2018-11-29 DIAGNOSIS — M810 Age-related osteoporosis without current pathological fracture: Secondary | ICD-10-CM | POA: Diagnosis present

## 2018-11-29 DIAGNOSIS — I509 Heart failure, unspecified: Secondary | ICD-10-CM

## 2018-11-29 DIAGNOSIS — I10 Essential (primary) hypertension: Secondary | ICD-10-CM | POA: Diagnosis present

## 2018-11-29 DIAGNOSIS — Z972 Presence of dental prosthetic device (complete) (partial): Secondary | ICD-10-CM

## 2018-11-29 DIAGNOSIS — R0600 Dyspnea, unspecified: Secondary | ICD-10-CM | POA: Diagnosis not present

## 2018-11-29 DIAGNOSIS — C50119 Malignant neoplasm of central portion of unspecified female breast: Secondary | ICD-10-CM

## 2018-11-29 LAB — COMPREHENSIVE METABOLIC PANEL
ALT: 24 U/L (ref 0–44)
AST: 19 U/L (ref 15–41)
Albumin: 3.5 g/dL (ref 3.5–5.0)
Alkaline Phosphatase: 68 U/L (ref 38–126)
Anion gap: 9 (ref 5–15)
BUN: 10 mg/dL (ref 8–23)
CO2: 22 mmol/L (ref 22–32)
Calcium: 8.9 mg/dL (ref 8.9–10.3)
Chloride: 106 mmol/L (ref 98–111)
Creatinine, Ser: 1.01 mg/dL — ABNORMAL HIGH (ref 0.44–1.00)
GFR calc Af Amer: 57 mL/min — ABNORMAL LOW (ref >60)
GFR calc non Af Amer: 49 mL/min — ABNORMAL LOW (ref >60)
Glucose, Bld: 114 mg/dL — ABNORMAL HIGH (ref 70–99)
Potassium: 3.9 mmol/L (ref 3.5–5.1)
Sodium: 137 mmol/L (ref 135–145)
Total Bilirubin: 0.8 mg/dL (ref 0.3–1.2)
Total Protein: 6.6 g/dL (ref 6.5–8.1)

## 2018-11-29 LAB — CBC
HCT: 39 % (ref 36.0–46.0)
Hemoglobin: 12.7 g/dL (ref 12.0–15.0)
MCH: 29.5 pg (ref 26.0–34.0)
MCHC: 32.6 g/dL (ref 30.0–36.0)
MCV: 90.7 fL (ref 80.0–100.0)
Platelets: 267 10*3/uL (ref 150–400)
RBC: 4.3 MIL/uL (ref 3.87–5.11)
RDW: 13.9 % (ref 11.5–15.5)
WBC: 10 10*3/uL (ref 4.0–10.5)
nRBC: 0 % (ref 0.0–0.2)

## 2018-11-29 LAB — CREATININE, SERUM
Creatinine, Ser: 1.01 mg/dL — ABNORMAL HIGH (ref 0.44–1.00)
GFR calc Af Amer: 57 mL/min — ABNORMAL LOW (ref >60)
GFR calc non Af Amer: 49 mL/min — ABNORMAL LOW (ref >60)

## 2018-11-29 LAB — CBC WITH DIFFERENTIAL/PLATELET
Abs Immature Granulocytes: 0.02 10*3/uL (ref 0.00–0.07)
Basophils Absolute: 0.1 10*3/uL (ref 0.0–0.1)
Basophils Relative: 1 %
Eosinophils Absolute: 0.2 10*3/uL (ref 0.0–0.5)
Eosinophils Relative: 3 %
HCT: 35 % — ABNORMAL LOW (ref 36.0–46.0)
Hemoglobin: 11.4 g/dL — ABNORMAL LOW (ref 12.0–15.0)
Immature Granulocytes: 0 %
Lymphocytes Relative: 15 %
Lymphs Abs: 1.1 10*3/uL (ref 0.7–4.0)
MCH: 29.6 pg (ref 26.0–34.0)
MCHC: 32.6 g/dL (ref 30.0–36.0)
MCV: 90.9 fL (ref 80.0–100.0)
Monocytes Absolute: 0.7 10*3/uL (ref 0.1–1.0)
Monocytes Relative: 9 %
Neutro Abs: 5.4 10*3/uL (ref 1.7–7.7)
Neutrophils Relative %: 72 %
Platelets: 248 10*3/uL (ref 150–400)
RBC: 3.85 MIL/uL — ABNORMAL LOW (ref 3.87–5.11)
RDW: 13.9 % (ref 11.5–15.5)
WBC: 7.5 10*3/uL (ref 4.0–10.5)
nRBC: 0 % (ref 0.0–0.2)

## 2018-11-29 LAB — TSH: TSH: 22.297 u[IU]/mL — ABNORMAL HIGH (ref 0.350–4.500)

## 2018-11-29 LAB — TROPONIN I (HIGH SENSITIVITY)
Troponin I (High Sensitivity): 13 ng/L (ref <18)
Troponin I (High Sensitivity): 20 ng/L — ABNORMAL HIGH (ref <18)

## 2018-11-29 LAB — T4, FREE: Free T4: 0.72 ng/dL (ref 0.61–1.12)

## 2018-11-29 LAB — SARS CORONAVIRUS 2 BY RT PCR (HOSPITAL ORDER, PERFORMED IN ~~LOC~~ HOSPITAL LAB): SARS Coronavirus 2: NEGATIVE

## 2018-11-29 LAB — BRAIN NATRIURETIC PEPTIDE: B Natriuretic Peptide: 802.2 pg/mL — ABNORMAL HIGH (ref 0.0–100.0)

## 2018-11-29 MED ORDER — SODIUM CHLORIDE 0.9% FLUSH: 3.0000 mL | INTRAVENOUS | Status: DC | PRN

## 2018-11-29 MED ORDER — SODIUM CHLORIDE 0.9% FLUSH
3.0000 mL | Freq: Two times a day (BID) | INTRAVENOUS | Status: DC
  Administered 2018-11-29 – 2018-12-01 (×4): 3 mL via INTRAVENOUS

## 2018-11-29 MED ORDER — LEVOTHYROXINE SODIUM 75 MCG PO TABS
150.0000 ug | ORAL_TABLET | Freq: Every day | ORAL | Status: DC
  Administered 2018-11-30 – 2018-12-01 (×2): 150 ug via ORAL
  Filled 2018-11-29 (×2): qty 2

## 2018-11-29 MED ORDER — METOPROLOL SUCCINATE ER 25 MG PO TB24
25.0000 mg | ORAL_TABLET | Freq: Every day | ORAL | Status: DC
  Administered 2018-11-30 – 2018-12-01 (×2): 25 mg via ORAL
  Filled 2018-11-29 (×2): qty 1

## 2018-11-29 MED ORDER — LEVOTHYROXINE SODIUM 100 MCG PO TABS: 100.0000 ug | ORAL_TABLET | Freq: Every day | ORAL | Status: DC

## 2018-11-29 MED ORDER — FUROSEMIDE 10 MG/ML IJ SOLN
40.0000 mg | Freq: Three times a day (TID) | INTRAMUSCULAR | Status: DC
  Administered 2018-11-29 – 2018-11-30 (×2): 40 mg via INTRAVENOUS
  Filled 2018-11-29 (×2): qty 4

## 2018-11-29 MED ORDER — ONDANSETRON HCL 4 MG/2ML IJ SOLN: 4.0000 mg | Freq: Four times a day (QID) | INTRAMUSCULAR | Status: DC | PRN

## 2018-11-29 MED ORDER — AMLODIPINE BESYLATE 5 MG PO TABS
5.0000 mg | ORAL_TABLET | Freq: Every day | ORAL | Status: DC
  Administered 2018-11-29 – 2018-12-01 (×3): 5 mg via ORAL
  Filled 2018-11-29 (×3): qty 2

## 2018-11-29 MED ORDER — NITROGLYCERIN 0.4 MG SL SUBL: 0.4000 mg | SUBLINGUAL_TABLET | SUBLINGUAL | Status: DC | PRN

## 2018-11-29 MED ORDER — ISOSORBIDE MONONITRATE ER 30 MG PO TB24
60.0000 mg | ORAL_TABLET | Freq: Every day | ORAL | Status: DC
  Administered 2018-11-29 – 2018-12-01 (×3): 60 mg via ORAL
  Filled 2018-11-29 (×3): qty 2

## 2018-11-29 MED ORDER — ATORVASTATIN CALCIUM 10 MG PO TABS
20.0000 mg | ORAL_TABLET | Freq: Every day | ORAL | Status: DC
  Administered 2018-11-29 – 2018-12-01 (×3): 20 mg via ORAL
  Filled 2018-11-29 (×3): qty 2

## 2018-11-29 MED ORDER — HEPARIN SODIUM (PORCINE) 5000 UNIT/ML IJ SOLN
5000.0000 [IU] | Freq: Three times a day (TID) | INTRAMUSCULAR | Status: DC
  Administered 2018-11-29 – 2018-12-01 (×5): 5000 [IU] via SUBCUTANEOUS
  Filled 2018-11-29 (×5): qty 1

## 2018-11-29 MED ORDER — DESIPRAMINE HCL 50 MG PO TABS
50.0000 mg | ORAL_TABLET | Freq: Every day | ORAL | Status: DC
  Administered 2018-11-29: 50 mg via ORAL
  Filled 2018-11-29: qty 1

## 2018-11-29 MED ORDER — POTASSIUM CHLORIDE ER 10 MEQ PO TBCR
10.0000 meq | EXTENDED_RELEASE_TABLET | Freq: Three times a day (TID) | ORAL | Status: DC
  Administered 2018-11-29 – 2018-11-30 (×2): 10 meq via ORAL
  Filled 2018-11-29 (×4): qty 1

## 2018-11-29 MED ORDER — SODIUM CHLORIDE 0.9 % IV SOLN: 250.0000 mL | INTRAVENOUS | Status: DC | PRN

## 2018-11-29 MED ORDER — GABAPENTIN 100 MG PO CAPS
200.0000 mg | ORAL_CAPSULE | Freq: Two times a day (BID) | ORAL | Status: DC
  Administered 2018-11-29 – 2018-12-01 (×4): 200 mg via ORAL
  Filled 2018-11-29 (×4): qty 2

## 2018-11-29 MED ORDER — CLOPIDOGREL BISULFATE 75 MG PO TABS
75.0000 mg | ORAL_TABLET | Freq: Every day | ORAL | Status: DC
  Administered 2018-11-30 – 2018-12-01 (×2): 75 mg via ORAL
  Filled 2018-11-29 (×2): qty 1

## 2018-11-29 MED ORDER — ACETAMINOPHEN 325 MG PO TABS
650.0000 mg | ORAL_TABLET | ORAL | Status: DC | PRN
  Administered 2018-11-30 (×2): 650 mg via ORAL
  Filled 2018-11-29 (×2): qty 2

## 2018-11-29 MED ORDER — FUROSEMIDE 10 MG/ML IJ SOLN
40.0000 mg | INTRAMUSCULAR | Status: AC
  Administered 2018-11-29: 18:00:00 40 mg via INTRAVENOUS
  Filled 2018-11-29: qty 4

## 2018-11-29 MED ORDER — ASPIRIN 81 MG PO CHEW
81.0000 mg | CHEWABLE_TABLET | Freq: Every day | ORAL | Status: DC
  Administered 2018-11-30 – 2018-12-01 (×2): 81 mg via ORAL
  Filled 2018-11-29 (×2): qty 1

## 2018-11-29 NOTE — ED Notes (Signed)
Purewick did not catch urine when pt went to bathroom. Pt and bed completely wet. Purewick removed and bed lines and pt changed.

## 2018-11-29 NOTE — ED Notes (Signed)
Purewick put in place 

## 2018-11-29 NOTE — ED Provider Notes (Signed)
[facility] EMERGENCY DEPARTMENT Provider Note   CSN: [id number] Arrival date & time:       History   Chief Complaint Chief Complaint  Patient presents with   Shortness of Breath    HPI Candace Ross is a 85 y.o. female.     85yo F w/ PMH including CAD s/p stenting, breast cancer, HTN who p/w shortness of breath.  The patient has had 1 week of progressively worsening shortness of breath especially with exertion. She denies chest pain for me. No associated fevers or leg swelling. She notes post-nasal drip. She was seen in clinic today and was sent here for further eval. She follows w/ Dr. Einar Gip.   The history is provided by the patient.  Shortness of Breath   Past Medical History:  Diagnosis Date   Anemia    Back pain    Breast cancer ([place])    Change in stool habits    Dribbling urine    H/O bone density study 2009   Hearing loss    Hypertension    Osteoporosis    Sinus problem    SOB (shortness of breath) on exertion    stairs   Spinal stenosis    Thyroid disease    Wears dentures     Patient Active Problem List   Diagnosis Date Noted   Spinal stenosis of lumbar region with neurogenic claudication 01/23/2017   Scoliosis of thoracolumbar spine 01/23/2017   Lumbar radiculopathy 01/23/2017   Unstable angina pectoris ([place]) 01/21/2016   CAD (coronary artery disease), native coronary artery 12/18/2014   Angina pectoris ([place]) 12/17/2014   Cancer of central portion of female breast ([place]) 10/20/2011    Past Surgical History:  Procedure Laterality Date   BARTHOLIN GLAND CYST EXCISION  1969   BREAST LUMPECTOMY  05/02/77   benign   CARDIAC CATHETERIZATION N/A 12/18/2014   Procedure: Left Heart Cath and Coronary Angiography;  Surgeon: Adrian Prows, MD;  Location: [facility];  Service: Cardiovascular;  Laterality: N/A;   CARDIAC CATHETERIZATION N/A 12/18/2014   Procedure: Coronary Stent Intervention;  Surgeon: Adrian Prows, MD;  Location: [facility];  Service: Cardiovascular;  Laterality: N/A;   HERNIA REPAIR  12/15/99     OB History   No obstetric history on file.      Home Medications    Prior to Admission medications   Medication Sig Start Date End Date Taking? Authorizing Provider  acetaminophen (TYLENOL) 500 MG tablet Take 500 mg by mouth every 6 (six) hours as needed.    [provider]  acetaminophen-codeine (TYLENOL #3) 300-30 MG tablet TAKE 1 TABLET EVERY 8 HOURS AS NEEDED FOR MODERATE PAIN Patient taking differently: Take 1 tablet by mouth every 8 (eight) hours as needed for moderate pain.  03/22/17   Magnus Sinning, MD  amLODipine (NORVASC) 10 MG tablet Take 0.5 tablets (5 mg total) by mouth daily. 01/23/16   Adrian Prows, MD  aspirin 81 MG chewable tablet Chew 81 mg by mouth daily.    [provider]  atorvastatin (LIPITOR) 20 MG tablet TAKE 1 TABLET EVERY DAY 10/07/18   Miquel Dunn, NP  calcium-vitamin D 250-100 MG-UNIT per tablet Take 1 tablet by mouth 2 (two) times daily.    [provider]  clopidogrel (PLAVIX) 75 MG tablet Take 1 tablet (75 mg total) by mouth daily with breakfast. 12/19/14   Adrian Prows, MD  Cyanocobalamin (VITAMIN B 12 PO) Take by mouth.  [provider]  desipramine (NORPRAMIN) 50 MG tablet Take 1 tablet (50 mg total) by mouth at bedtime. 03/07/17   Magnus Sinning, MD  dorzolamide (TRUSOPT) 2 % ophthalmic solution Place 1 drop into both eyes 2 (two) times daily.    [provider]  gabapentin (NEURONTIN) 100 MG capsule Start with 1 capsule at night and increase by 1 capsule every 3 days to get to 3 capsules. Then start with 1 capsule in the morning and increase by 1 capsule every 3 days to get to 3 capsules. Then stay at 3 capsules twice per day. 02/14/17   Magnus Sinning, MD  isosorbide mononitrate (IMDUR) 60 MG 24 hr tablet Take 60 mg by mouth daily.    [provider]  latanoprost (XALATAN)  0.005 % ophthalmic solution Place 1 drop into both eyes at bedtime.     [provider]  levothyroxine (SYNTHROID, LEVOTHROID) 100 MCG tablet Take 100 mcg by mouth daily. Except on Saturday and Sunday    [provider]  metoprolol succinate (TOPROL-XL) 25 MG 24 hr tablet Take 25 mg by mouth daily.    [provider]  Multiple Vitamins-Minerals (PRESERVISION AREDS PO) Take 1 capsule by mouth 2 (two) times daily.    [provider]  nitroGLYCERIN (NITROSTAT) 0.4 MG SL tablet Place 0.4 mg under the tongue every 5 (five) minutes as needed for chest pain.    [provider]  VITAMIN E PO Take by mouth.    [provider]    Family History Family History  Problem Relation Age of Onset   Colon cancer Brother     Social History Social History   Tobacco Use   Smoking status: Never Smoker   Smokeless tobacco: Never Used  Substance Use Topics   Alcohol use: No   Drug use: No     Allergies   Other   Review of Systems Review of Systems  Respiratory: Positive for shortness of breath.    All other systems reviewed and are negative except that which was mentioned in HPI   Physical Exam Updated Vital Signs BP (!) 170/70    Pulse 88    Temp 98.8 F (37.1 C) (Oral)    Resp 18    SpO2 95%   Physical Exam Vitals signs and nursing note reviewed.  Constitutional:      General: She is not in acute distress.    Appearance: She is well-developed.  HENT:     Head: Normocephalic and atraumatic.  Eyes:     Conjunctiva/sclera: Conjunctivae normal.  Neck:     Musculoskeletal: Neck supple.  Cardiovascular:     Rate and Rhythm: Normal rate and regular rhythm.     Heart sounds: Murmur present.  Pulmonary:     Effort: Pulmonary effort is normal.     Breath sounds: Decreased breath sounds present.     Comments: Diminished with crackles in bilateral bases Abdominal:     General: Bowel sounds are normal. There is no distension.      Palpations: Abdomen is soft.     Tenderness: There is no abdominal tenderness.  Musculoskeletal:     Right lower leg: No edema.     Left lower leg: No edema.  Skin:    General: Skin is warm and dry.  Neurological:     Mental Status: She is alert and oriented to person, place, and time.     Comments: Fluent speech  Psychiatric:  Judgment: Judgment normal.      ED Treatments / Results  Labs (all labs ordered are listed, but only abnormal results are displayed) Labs Reviewed  COMPREHENSIVE METABOLIC PANEL - Abnormal; Notable for the following components:      Result Value   Glucose, Bld 114 (*)    Creatinine, Ser 1.01 (*)    GFR calc non Af Amer 49 (*)    GFR calc Af Amer 57 (*)    All other components within normal limits  BRAIN NATRIURETIC PEPTIDE - Abnormal; Notable for the following components:   B Natriuretic Peptide 802.2 (*)    All other components within normal limits  TROPONIN I (HIGH SENSITIVITY) - Abnormal; Notable for the following components:   Troponin I (High Sensitivity) 20 (*)    All other components within normal limits  CBC WITH DIFFERENTIAL/PLATELET - Abnormal; Notable for the following components:   RBC 3.85 (*)    Hemoglobin 11.4 (*)    HCT 35.0 (*)    All other components within normal limits  TSH - Abnormal; Notable for the following components:   TSH 22.297 (*)    All other components within normal limits  CREATININE, SERUM - Abnormal; Notable for the following components:   Creatinine, Ser 1.01 (*)    GFR calc non Af Amer 49 (*)    GFR calc Af Amer 57 (*)    All other components within normal limits  SARS CORONAVIRUS 2 (HOSPITAL ORDER, [facility])  TROPONIN I (HIGH SENSITIVITY)  T4, FREE  CBC  BASIC METABOLIC PANEL  BRAIN NATRIURETIC PEPTIDE    EKG EKG Interpretation  Date/Time:  Friday November 29 2018 15:56:11 EDT Ventricular Rate:  77 PR Interval:    QRS Duration: 151 QT Interval:  420 QTC  Calculation: 476 R Axis:   10 Text Interpretation:  Sinus rhythm Left bundle branch block No significant change since last tracing Confirmed by Theotis Burrow [phone]) on 11/29/2018 4:54:25 PM   Radiology Dg Chest Port 1 View  Result Date: 11/29/2018 CLINICAL DATA:  Shortness of breath. EXAM: PORTABLE CHEST 1 VIEW COMPARISON:  Radiographs of January 21, 2016. FINDINGS: Stable cardiomegaly is noted. Atherosclerosis of thoracic aorta is noted. No pneumothorax is noted. Interval development of bilateral basilar opacities are noted concerning for edema or atelectasis with associated pleural effusions. Bony thorax is unremarkable. IMPRESSION: Interval development of bilateral basilar opacities are noted concerning for edema or atelectasis with associated pleural effusions. Aortic Atherosclerosis (ICD10-I70.0). Electronically Signed   By: Marijo Conception M.D.   On: 11/29/2018 17:21    Procedures Procedures (including critical care time)  Medications Ordered in ED Medications - No data to display   Initial Impression / Assessment and Plan / ED Course  I have reviewed the triage vital signs and the nursing notes.  Pertinent labs & imaging results that were available during my care of the patient were reviewed by me and considered in my medical decision making (see chart for details).     PT requiring 4L on exam, crackles but no respiratory distress. Afebrile.  Lab work shows creatinine of 1, BNP 802, TSH 22 with normal free T4, negative COVID-19 test.  Chest x-ray with pulmonary edema bilaterally.  I suspect congestive heart failure and have given a dose of IV Lasix.  Discussed with her cardiologist, Dr. Einar Gip, who will admit for further care.  Final Clinical Impressions(s) / ED Diagnoses   Final diagnoses:  None    ED Discharge  Orders    None       Loreli Debruler, Wenda Overland, MD 11/29/18 2317

## 2018-11-29 NOTE — ED Notes (Signed)
ED TO INPATIENT HANDOFF REPORT  ED Nurse Name and Phone #: [phone]  S Name/Age/Gender Candace Ross 85 y.o. female Room/Bed: 040C/040C  Code Status   Code Status: Full Code  Home/SNF/Other Home Patient oriented to: self, place, time and situation Is this baseline? Yes   Triage Complete: Triage complete  Chief Complaint SOB  Triage Note Pt arrived to ed via [facility] from urgent care with c/o SOB that increases with exertion. EMS reports pt 88% on RA. Pt afebrile at 98.25F, and denies CP.    Allergies Allergies  Allergen Reactions  . Other Other (See Comments)    "some medicine they gave me in the hospital, it was a little purple pill, to help me sleep didn't work; it made me mean"    Level of Care/Admitting Diagnosis ED Disposition    ED Disposition Condition [place]: Candace Ross [[id number]]  Level of Care: Telemetry Cardiac [103]  Covid Evaluation: Asymptomatic Screening Protocol (No Symptoms)  Diagnosis: Acute on chronic diastolic CHF (congestive heart failure) [facility]) [[id number]]  Admitting Physician: Frederica Kuster  Attending Physician: Adrian Prows [phone]  Estimated length of stay: past midnight tomorrow  Certification:: I certify this patient will need inpatient services for at least 2 midnights  PT Class (Do Not Modify): Inpatient [101]  PT Acc Code (Do Not Modify): Private [1]       B Medical/Surgery History Past Medical History:  Diagnosis Date  . Anemia   . Back pain   . Breast cancer ([place])   . Change in stool habits   . Dribbling urine   . H/O bone density study 2009  . Hearing loss   . Hypertension   . Osteoporosis   . Sinus problem   . SOB (shortness of breath) on exertion    stairs  . Spinal stenosis   . Thyroid disease   . Wears dentures    Past Surgical History:  Procedure Laterality Date  . BARTHOLIN GLAND CYST EXCISION  1969  . BREAST LUMPECTOMY  05/02/77   benign  . CARDIAC CATHETERIZATION  N/A 12/18/2014   Procedure: Left Heart Cath and Coronary Angiography;  Surgeon: Adrian Prows, MD;  Location: [facility];  Service: Cardiovascular;  Laterality: N/A;  . CARDIAC CATHETERIZATION N/A 12/18/2014   Procedure: Coronary Stent Intervention;  Surgeon: Adrian Prows, MD;  Location: [facility];  Service: Cardiovascular;  Laterality: N/A;  . HERNIA REPAIR  12/15/99     A IV Location/Drains/Wounds Patient Lines/Drains/Airways Status   Active Line/Drains/Airways    Name:   Placement date:   Placement time:   Site:   Days:   Peripheral IV 11/29/18 Left Forearm   11/29/18    1613    Forearm   less than 1          Intake/Output Last 24 hours No intake or output data in the 24 hours ending 11/29/18 1951  Labs/Imaging Results for orders placed or performed during the hospital encounter of 11/29/18 (from the past 48 hour(s))  Comprehensive metabolic panel     Status: Abnormal   Collection Time: 11/29/18  4:20 PM  Result Value Ref Range   Sodium 137 135 - 145 mmol/L   Potassium 3.9 3.5 - 5.1 mmol/L   Chloride 106 98 - 111 mmol/L   CO2 22 22 - 32 mmol/L   Glucose, Bld 114 (H) 70 - 99 mg/dL   BUN 10 8 - 23 mg/dL   Creatinine,  Ser 1.01 (H) 0.44 - 1.00 mg/dL   Calcium 8.9 8.9 - 10.3 mg/dL   Total Protein 6.6 6.5 - 8.1 g/dL   Albumin 3.5 3.5 - 5.0 g/dL   AST 19 15 - 41 U/L   ALT 24 0 - 44 U/L   Alkaline Phosphatase 68 38 - 126 U/L   Total Bilirubin 0.8 0.3 - 1.2 mg/dL   GFR calc non Af Amer 49 (L) >60 mL/min   GFR calc Af Amer 57 (L) >60 mL/min   Anion gap 9 5 - 15    Comment: Performed at [facility] [address], [place], [place] [address]  Brain natriuretic peptide     Status: Abnormal   Collection Time: 11/29/18  4:20 PM  Result Value Ref Range   B Natriuretic Peptide 802.2 (H) 0.0 - 100.0 pg/mL    Comment: Performed at [facility] [address], [place], [place] [address]  Troponin I (High Sensitivity)     Status: None   Collection Time: 11/29/18   4:20 PM  Result Value Ref Range   Troponin I (High Sensitivity) 13 <18 ng/L    Comment: (NOTE) Elevated high sensitivity troponin I (hsTnI) values and significant  changes across serial measurements may suggest ACS but many other  chronic and acute conditions are known to elevate hsTnI results.  Refer to the "Links" section for chest pain algorithms and additional  guidance. Performed at [facility], [place] [address] [place], [place] [address]   CBC with Differential     Status: Abnormal   Collection Time: 11/29/18  4:20 PM  Result Value Ref Range   WBC 7.5 4.0 - 10.5 K/uL   RBC 3.85 (L) 3.87 - 5.11 MIL/uL   Hemoglobin 11.4 (L) 12.0 - 15.0 g/dL   HCT 35.0 (L) 36.0 - 46.0 %   MCV 90.9 80.0 - 100.0 fL   MCH 29.6 26.0 - 34.0 pg   MCHC 32.6 30.0 - 36.0 g/dL   RDW 13.9 11.5 - 15.5 %   Platelets 248 150 - 400 K/uL   nRBC 0.0 0.0 - 0.2 %   Neutrophils Relative % 72 %   Neutro Abs 5.4 1.7 - 7.7 K/uL   Lymphocytes Relative 15 %   Lymphs Abs 1.1 0.7 - 4.0 K/uL   Monocytes Relative 9 %   Monocytes Absolute 0.7 0.1 - 1.0 K/uL   Eosinophils Relative 3 %   Eosinophils Absolute 0.2 0.0 - 0.5 K/uL   Basophils Relative 1 %   Basophils Absolute 0.1 0.0 - 0.1 K/uL   Immature Granulocytes 0 %   Abs Immature Granulocytes 0.02 0.00 - 0.07 K/uL    Comment: Performed at [facility] [address], [place], [place] [address]  TSH     Status: Abnormal   Collection Time: 11/29/18  4:21 PM  Result Value Ref Range   TSH 22.297 (H) 0.350 - 4.500 uIU/mL    Comment: Performed by a 3rd Generation assay with a functional sensitivity of <=0.01 uIU/mL. Performed at [facility], [place] [address] [place],  [address]   SARS Coronavirus 2 (CEPHEID- Performed in [facility] hospital lab), Hosp Order     Status: None   Collection Time: 11/29/18  4:32 PM   Specimen: Nasopharyngeal Swab  Result Value Ref Range   SARS Coronavirus 2 NEGATIVE NEGATIVE    Comment: (NOTE) If result is  NEGATIVE SARS-CoV-2 target nucleic acids are NOT DETECTED. The SARS-CoV-2  RNA is generally detectable in upper and lower  respiratory specimens during the acute phase of infection. The lowest  concentration of SARS-CoV-2 viral copies this assay can detect is 250  copies / mL. A negative result does not preclude SARS-CoV-2 infection  and should not be used as the sole basis for treatment or other  patient management decisions.  A negative result may occur with  improper specimen collection / handling, submission of specimen other  than nasopharyngeal swab, presence of viral mutation(s) within the  areas targeted by this assay, and inadequate number of viral copies  (<250 copies / mL). A negative result must be combined with clinical  observations, patient history, and epidemiological information. If result is POSITIVE SARS-CoV-2 target nucleic acids are DETECTED. The SARS-CoV-2 RNA is generally detectable in upper and lower  respiratory specimens dur ing the acute phase of infection.  Positive  results are indicative of active infection with SARS-CoV-2.  Clinical  correlation with patient history and other diagnostic information is  necessary to determine patient infection status.  Positive results do  not rule out bacterial infection or co-infection with other viruses. If result is PRESUMPTIVE POSTIVE SARS-CoV-2 nucleic acids MAY BE PRESENT.   A presumptive positive result was obtained on the submitted specimen  and confirmed on repeat testing.  While 2019 novel coronavirus  (SARS-CoV-2) nucleic acids may be present in the submitted sample  additional confirmatory testing may be necessary for epidemiological  and / or clinical management purposes  to differentiate between  SARS-CoV-2 and other Sarbecovirus currently known to infect humans.  If clinically indicated additional testing with an alternate test  methodology [phone]) is advised. The SARS-CoV-2 RNA is generally  detectable  in upper and lower respiratory sp ecimens during the acute  phase of infection. The expected result is Negative. Fact Sheet for Patients:  StrictlyIdeas.no Fact Sheet for Healthcare Providers: BankingDealers.co.za This test is not yet approved or cleared by the [place] FDA and has been authorized for detection and/or diagnosis of SARS-CoV-2 by FDA under an Emergency Use Authorization (EUA).  This EUA will remain in effect (meaning this test can be used) for the duration of the COVID-19 declaration under Section 564(b)(1) of the Act, 21 U.S.C. section 360bbb-3(b)(1), unless the authorization is terminated or revoked sooner. Performed at [facility], [place] [address] [place], [place] [address]   T4, free     Status: None   Collection Time: 11/29/18  4:45 PM  Result Value Ref Range   Free T4 0.72 0.61 - 1.12 ng/dL    Comment: (NOTE) Biotin ingestion may interfere with free T4 tests. If the results are inconsistent with the TSH level, previous test results, or the clinical presentation, then consider biotin interference. If needed, order repeat testing after stopping biotin. Performed at [facility], [place] [address] [place], [place] [address]   Troponin I (High Sensitivity)     Status: Abnormal   Collection Time: 11/29/18  6:20 PM  Result Value Ref Range   Troponin I (High Sensitivity) 20 (H) <18 ng/L    Comment: (NOTE) Elevated high sensitivity troponin I (hsTnI) values and significant  changes across serial measurements may suggest ACS but many other  chronic and acute conditions are known to elevate hsTnI results.  Refer to the "Links" section for chest pain algorithms and additional  guidance. Performed at [facility], [place] [address] [place], [place] [address]   CBC     Status: None  Collection Time: 11/29/18  6:39 PM  Result Value Ref Range   WBC 10.0 4.0 - 10.5 K/uL   RBC 4.30 3.87 - 5.11 MIL/uL    Hemoglobin 12.7 12.0 - 15.0 g/dL   HCT 39.0 36.0 - 46.0 %   MCV 90.7 80.0 - 100.0 fL   MCH 29.5 26.0 - 34.0 pg   MCHC 32.6 30.0 - 36.0 g/dL   RDW 13.9 11.5 - 15.5 %   Platelets 267 150 - 400 K/uL   nRBC 0.0 0.0 - 0.2 %    Comment: Performed at [facility], [place] [address], [place], [place] [address]  Creatinine, serum     Status: Abnormal   Collection Time: 11/29/18  6:39 PM  Result Value Ref Range   Creatinine, Ser 1.01 (H) 0.44 - 1.00 mg/dL   GFR calc non Af Amer 49 (L) >60 mL/min   GFR calc Af Amer 57 (L) >60 mL/min    Comment: Performed at [facility] [address] [place], [place] [address]   Dg Chest Port 1 View  Result Date: 11/29/2018 CLINICAL DATA:  Shortness of breath. EXAM: PORTABLE CHEST 1 VIEW COMPARISON:  Radiographs of January 21, 2016. FINDINGS: Stable cardiomegaly is noted. Atherosclerosis of thoracic aorta is noted. No pneumothorax is noted. Interval development of bilateral basilar opacities are noted concerning for edema or atelectasis with associated pleural effusions. Bony thorax is unremarkable. IMPRESSION: Interval development of bilateral basilar opacities are noted concerning for edema or atelectasis with associated pleural effusions. Aortic Atherosclerosis (ICD10-I70.0). Electronically Signed   By: Marijo Conception M.D.   On: 11/29/2018 17:21    Pending Labs Unresulted Labs (From admission, onward)    Start     Ordered   11/30/18 9518  Basic metabolic panel  Daily,   R     11/29/18 1843   11/30/18 0500  Brain natriuretic peptide  Daily,   R     11/29/18 1844          Vitals/Pain Today's Vitals   11/29/18 1600 11/29/18 1615 11/29/18 1818 11/29/18 1830  BP: (!) 155/71 (!) 159/92 (!) 141/76   Pulse: 78 81  83  Resp: 20 (!) 25 (!) 23   Temp:      TempSrc:      SpO2: 95% 96%  95%  PainSc:        Isolation Precautions No active isolations  Medications Medications  aspirin chewable tablet 81 mg (has no administration in time  range)  amLODipine (NORVASC) tablet 5 mg (has no administration in time range)  atorvastatin (LIPITOR) tablet 20 mg (has no administration in time range)  isosorbide mononitrate (IMDUR) 24 hr tablet 60 mg (has no administration in time range)  metoprolol succinate (TOPROL-XL) 24 hr tablet 25 mg (has no administration in time range)  nitroGLYCERIN (NITROSTAT) SL tablet 0.4 mg (has no administration in time range)  desipramine (NORPRAMIN) tablet 50 mg (has no administration in time range)  clopidogrel (PLAVIX) tablet 75 mg (has no administration in time range)  gabapentin (NEURONTIN) capsule 200 mg (has no administration in time range)  sodium chloride flush (NS) 0.9 % injection 3 mL (has no administration in time range)  sodium chloride flush (NS) 0.9 % injection 3 mL (has no administration in time range)  0.9 %  sodium chloride infusion (has no administration in time range)  acetaminophen (TYLENOL) tablet 650 mg (has no administration in time range)  ondansetron (ZOFRAN) injection 4 mg (has no administration in  time range)  heparin injection 5,000 Units (has no administration in time range)  furosemide (LASIX) injection 40 mg (has no administration in time range)  potassium chloride (K-DUR) CR tablet 10 mEq (has no administration in time range)  levothyroxine (SYNTHROID) tablet 150 mcg (has no administration in time range)  furosemide (LASIX) injection 40 mg (40 mg Intravenous Given 11/29/18 1829)    Mobility manual wheelchair Moderate fall risk   Focused Assessments Pulmonary Assessment Handoff:  Lung sounds: Bilateral Breath Sounds: Fine crackles L Breath Sounds: Fine crackles O2 Device: Nasal Cannula O2 Flow Rate (L/min): 3 L/min      R Recommendations: See Admitting Provider Note  Report given to:   Additional Notes: Pt is Covid negative.  A&O x4.  Denies any pain.

## 2018-11-29 NOTE — ED Triage Notes (Signed)
Pt arrived to ed via [facility] from urgent care with c/o SOB that increases with exertion. EMS reports pt 88% on RA. Pt afebrile at 98.60F, and denies CP.

## 2018-11-29 NOTE — ED Notes (Signed)
Spoke with patients brother and provided update. [phone]

## 2018-11-29 NOTE — H&P (Signed)
CARDIOLOGY ADMIT NOTE   Candace Ross is an 85 y.o. female.    Chief Complaint  Patient presents with  . Shortness of Breath      HPI: Candace Ross  is a 85 y.o. female  with coronary artery disease, S/P stenting to the RCA and circumflex coronary artery in 2016, hypertension, hyperlipidemia, who is fairly active and independent, had been doing well until a week ago started noticing shortness of breath.  She had tick bites in her left foot and also in the back which she had them removed sometime in the first week of June and she thought that she may be having Lyme's disease and presented to the urgent care and she was hypoxemic.  She was evaluated in the emergency room and as she was hypoxemic, was started on IV Lasix and also oxygen therapy treatment.  She is still short of breath but states that she feels well.  She denies any chest pain or palpitations.  She has had some orthopnea over the past couple days.  No chest pain, no dizziness or syncope.  Past Medical History:  Diagnosis Date  . Anemia   . Back pain   . Breast cancer ([place])   . Change in stool habits   . Dribbling urine   . H/O bone density study 2009  . Hearing loss   . Hypertension   . Osteoporosis   . Sinus problem   . SOB (shortness of breath) on exertion    stairs  . Spinal stenosis   . Thyroid disease   . Wears dentures     Past Surgical History:  Procedure Laterality Date  . BARTHOLIN GLAND CYST EXCISION  1969  . BREAST LUMPECTOMY  05/02/77   benign  . CARDIAC CATHETERIZATION N/A 12/18/2014   Procedure: Left Heart Cath and Coronary Angiography;  Surgeon: Adrian Prows, MD;  Location: [facility];  Service: Cardiovascular;  Laterality: N/A;  . CARDIAC CATHETERIZATION N/A 12/18/2014   Procedure: Coronary Stent Intervention;  Surgeon: Adrian Prows, MD;  Location: [facility];  Service: Cardiovascular;  Laterality: N/A;  . HERNIA REPAIR  12/15/99    Social History   Socioeconomic History  .  Marital status: Widowed    Spouse name: Not on file  . Number of children: Not on file  . Years of education: Not on file  . Highest education level: Not on file  Occupational History  . Not on file  Social Needs  . Financial resource strain: Not on file  . Food insecurity    Worry: Not on file    Inability: Not on file  . Transportation needs    Medical: Not on file    Non-medical: Not on file  Tobacco Use  . Smoking status: Never Smoker  . Smokeless tobacco: Never Used  Substance and Sexual Activity  . Alcohol use: No  . Drug use: No  . Sexual activity: Not on file  Lifestyle  . Physical activity    Days per week: Not on file    Minutes per session: Not on file  . Stress: Not on file  Relationships  . Social Herbalist on phone: Not on file    Gets together: Not on file    Attends religious service: Not on file    Active member of club or organization: Not on file    Attends meetings of clubs or organizations: Not on file    Relationship status: Not on  file  . Intimate partner violence    Fear of current or ex partner: Not on file    Emotionally abused: Not on file    Physically abused: Not on file    Forced sexual activity: Not on file  Other Topics Concern  . Not on file  Social History Narrative  . Not on file    No current facility-administered medications on file prior to encounter.    Current Outpatient Medications on File Prior to Encounter  Medication Sig Dispense Refill  . acetaminophen (TYLENOL) 500 MG tablet Take 500 mg by mouth every 6 (six) hours as needed.    Marland Kitchen acetaminophen-codeine (TYLENOL #3) 300-30 MG tablet TAKE 1 TABLET EVERY 8 HOURS AS NEEDED FOR MODERATE PAIN (Patient taking differently: Take 1 tablet by mouth every 8 (eight) hours as needed for moderate pain. ) 20 tablet 0  . amLODipine (NORVASC) 10 MG tablet Take 0.5 tablets (5 mg total) by mouth daily. 30 tablet 2  . aspirin 81 MG chewable tablet Chew 81 mg by mouth daily.     Marland Kitchen atorvastatin (LIPITOR) 20 MG tablet TAKE 1 TABLET EVERY DAY 90 tablet 1  . calcium-vitamin D 250-100 MG-UNIT per tablet Take 1 tablet by mouth 2 (two) times daily.    . clopidogrel (PLAVIX) 75 MG tablet Take 1 tablet (75 mg total) by mouth daily with breakfast. 30 tablet 11  . Cyanocobalamin (VITAMIN B 12 PO) Take by mouth.    . desipramine (NORPRAMIN) 50 MG tablet Take 1 tablet (50 mg total) by mouth at bedtime. 30 tablet 1  . dorzolamide (TRUSOPT) 2 % ophthalmic solution Place 1 drop into both eyes 2 (two) times daily.    Marland Kitchen gabapentin (NEURONTIN) 100 MG capsule Start with 1 capsule at night and increase by 1 capsule every 3 days to get to 3 capsules. Then start with 1 capsule in the morning and increase by 1 capsule every 3 days to get to 3 capsules. Then stay at 3 capsules twice per day. 180 capsule 0  . isosorbide mononitrate (IMDUR) 60 MG 24 hr tablet Take 60 mg by mouth daily.    Marland Kitchen latanoprost (XALATAN) 0.005 % ophthalmic solution Place 1 drop into both eyes at bedtime.     Marland Kitchen levothyroxine (SYNTHROID, LEVOTHROID) 100 MCG tablet Take 100 mcg by mouth daily. Except on Saturday and Sunday    . metoprolol succinate (TOPROL-XL) 25 MG 24 hr tablet Take 25 mg by mouth daily.    . Multiple Vitamins-Minerals (PRESERVISION AREDS PO) Take 1 capsule by mouth 2 (two) times daily.    . nitroGLYCERIN (NITROSTAT) 0.4 MG SL tablet Place 0.4 mg under the tongue every 5 (five) minutes as needed for chest pain.    Marland Kitchen VITAMIN E PO Take by mouth.      Review of Systems  Constitution: Negative for chills, decreased appetite, malaise/fatigue and weight gain.  Cardiovascular: Positive for dyspnea on exertion and orthopnea. Negative for chest pain, claudication, leg swelling and syncope.  Endocrine: Negative for cold intolerance.  Hematologic/Lymphatic: Does not bruise/bleed easily.  Musculoskeletal: Positive for arthritis (chronic) and back pain (chronic). Negative for joint swelling.  Gastrointestinal:  Negative for abdominal pain, anorexia, change in bowel habit, hematochezia and melena.  Neurological: Negative for headaches and light-headedness.  Psychiatric/Behavioral: Negative for depression and substance abuse.  All other systems reviewed and are negative.     Objective:  Blood pressure (!) 141/76, pulse 83, temperature 98.8 F (37.1 C), temperature source Oral, resp. rate Marland Kitchen)  23, SpO2 95 %. There is no height or weight on file to calculate BMI.  Physical Exam  Constitutional: She appears well-developed and well-nourished. No distress.  HENT:  Head: Atraumatic.  Eyes: Conjunctivae are normal.  Neck: Neck supple. No thyromegaly present.  Cardiovascular: Normal rate, regular rhythm, S1 normal and S2 normal. Exam reveals no gallop.  Murmur heard. High-pitched blowing holosystolic murmur is present with a grade of 3/6 at the apex.  Low-pitched early systolic murmur of grade 2/6 is also present at the upper right sternal border radiating to the neck. There is faint bilateral carotid artery bruit.  Distal pulses were normal. Elevated JVD noted.  Pulmonary/Chest: Effort normal. She has rales.  Abdominal: Soft. Bowel sounds are normal.  Musculoskeletal: Normal range of motion.        General: No edema.  Neurological: She is alert. Abnormal coordination: bilateral scattered soft crackles.  Skin: Skin is warm and dry.  Psychiatric: She has a normal mood and affect.   Radiology: Dg Chest Port 1 View  Result Date: 11/29/2018 CLINICAL DATA:  Shortness of breath. EXAM: PORTABLE CHEST 1 VIEW COMPARISON:  Radiographs of January 21, 2016. FINDINGS: Stable cardiomegaly is noted. Atherosclerosis of thoracic aorta is noted. No pneumothorax is noted. Interval development of bilateral basilar opacities are noted concerning for edema or atelectasis with associated pleural effusions. Bony thorax is unremarkable. IMPRESSION: Interval development of bilateral basilar opacities are noted concerning for  edema or atelectasis with associated pleural effusions. Aortic Atherosclerosis (ICD10-I70.0). Electronically Signed   By: Marijo Conception M.D.   On: 11/29/2018 17:21    Laboratory Examination:  CMP Latest Ref Rng & Units 11/29/2018 01/22/2016 01/21/2016  Glucose 70 - 99 mg/dL 114(H) 91 -  BUN 8 - 23 mg/dL 10 12 -  Creatinine 0.44 - 1.00 mg/dL 1.01(H) 0.90 0.87  Sodium 135 - 145 mmol/L 137 139 -  Potassium 3.5 - 5.1 mmol/L 3.9 4.0 -  Chloride 98 - 111 mmol/L 106 104 -  CO2 22 - 32 mmol/L 22 24 -  Calcium 8.9 - 10.3 mg/dL 8.9 9.7 -  Total Protein 6.5 - 8.1 g/dL 6.6 - -  Total Bilirubin 0.3 - 1.2 mg/dL 0.8 - -  Alkaline Phos 38 - 126 U/L 68 - -  AST 15 - 41 U/L 19 - -  ALT 0 - 44 U/L 24 - -   CBC Latest Ref Rng & Units 11/29/2018 01/21/2016 01/21/2016  WBC 4.0 - 10.5 K/uL 7.5 9.8 8.9  Hemoglobin 12.0 - 15.0 g/dL 11.4(L) 11.6(L) 10.2(L)  Hematocrit 36.0 - 46.0 % 35.0(L) 37.6 33.5(L)  Platelets 150 - 400 K/uL 248 342 343   Lipid Panel     Component Value Date/Time   CHOL 201 (H) 01/22/2016 0447   TRIG 131 01/22/2016 0447   HDL 59 01/22/2016 0447   CHOLHDL 3.4 01/22/2016 0447   VLDL 26 01/22/2016 0447   LDLCALC 116 (H) 01/22/2016 0447   HEMOGLOBIN A1C No results found for: HGBA1C, MPG TSH Recent Labs    11/29/18 1621  TSH 22.297*   Cardiac Panel (last 3 results) No results for input(s): CKTOTAL, CKMB, TROPONINI, RELINDX in the last 72 hours.  Cardiac studies:   EKG 11/29/2018: Normal sinus rhythm at rate of 72 bpm, left bundle branch block.  Nuclear stress test 01/23/2016: Large fixed defect involving the mid and distal anterior wall, anterior septum and anterolateral wall with hypokinesis, no reversibility identified, LVEF 60%.  Coronary angiogram 12/18/2014:  RPDA lesion, 70% stenosed.  PTCA and stenting of the proximal and mid circumflex with a 3.0 x 38 mm resolute integrity DES for 90% stenosis, stenting of the mid RCA with a 2.75 x 26 mm resolute integrity DES for 99%  stenosis, both reduced to 0%.  LM lesion, 10% stenosed.  LVEF 45% with mid to distal anterolateral akinesis, inferolateral mild hypokinesis.  Carotid artery duplex 11/13/2016: Right ICA stenosis of 50 to 69%, lower end of spectrum.  Stenosis of right external carotid artery of less than 50%. Stenosis in the left ICA of 16 to 49%. Antegrade bilateral vertebral artery flow.  No significant change from 05/25/2016.  Assessment:   1.  Acute on chronic diastolic heart failure 2.  Coronary artery disease of the native vessel with probable other forms of angina including heart failure. 3.  Hypertension 4.  Physical exam consistent with severe MR.  New finding since prior examination about 6 months ago. 5.  Markedly abnormal TSH, needs adjustment in her Synthroid dose.  Plan:  Patient will be admitted to the hospital for IV diuresis, I will increase her Synthroid dose minimally, will obtain echocardiogram, follow-up on her serum creatinine closely.  Blood pressure is slightly elevated, should improve with diuresis.  Follow-up on the heart murmur as well. Although close to 85 years of age, she lives independently and is fairly active.  Full code for now.  Adrian Prows, MD, [facility] 11/29/2018, 6:57 PM [facility]. [place] Pager: [phone] Office: [phone] If no answer Cell [phone]

## 2018-11-29 NOTE — ED Notes (Signed)
Dinner tray at bedside

## 2018-11-30 ENCOUNTER — Inpatient Hospital Stay (HOSPITAL_COMMUNITY): Payer: Medicare PPO

## 2018-11-30 DIAGNOSIS — I340 Nonrheumatic mitral (valve) insufficiency: Secondary | ICD-10-CM

## 2018-11-30 DIAGNOSIS — R252 Cramp and spasm: Secondary | ICD-10-CM

## 2018-11-30 LAB — BASIC METABOLIC PANEL
Anion gap: 11 (ref 5–15)
BUN: 11 mg/dL (ref 8–23)
CO2: 21 mmol/L — ABNORMAL LOW (ref 22–32)
Calcium: 8.8 mg/dL — ABNORMAL LOW (ref 8.9–10.3)
Chloride: 102 mmol/L (ref 98–111)
Creatinine, Ser: 0.9 mg/dL (ref 0.44–1.00)
GFR calc Af Amer: >60 mL/min (ref >60)
GFR calc non Af Amer: 57 mL/min — ABNORMAL LOW (ref >60)
Glucose, Bld: 118 mg/dL — ABNORMAL HIGH (ref 70–99)
Potassium: 3.7 mmol/L (ref 3.5–5.1)
Sodium: 134 mmol/L — ABNORMAL LOW (ref 135–145)

## 2018-11-30 LAB — ECHOCARDIOGRAM COMPLETE
Height: 58.5 in
Weight: 1936 oz

## 2018-11-30 LAB — BRAIN NATRIURETIC PEPTIDE: B Natriuretic Peptide: 866.7 pg/mL — ABNORMAL HIGH (ref 0.0–100.0)

## 2018-11-30 MED ORDER — POTASSIUM CHLORIDE ER 10 MEQ PO TBCR
10.0000 meq | EXTENDED_RELEASE_TABLET | Freq: Two times a day (BID) | ORAL | Status: AC
  Administered 2018-11-30: 21:00:00 10 meq via ORAL
  Filled 2018-11-30 (×3): qty 1

## 2018-11-30 MED ORDER — MAGNESIUM OXIDE 400 (241.3 MG) MG PO TABS
400.0000 mg | ORAL_TABLET | Freq: Two times a day (BID) | ORAL | Status: DC
  Administered 2018-11-30 – 2018-12-01 (×3): 400 mg via ORAL
  Filled 2018-11-30 (×3): qty 1

## 2018-11-30 MED ORDER — FUROSEMIDE 10 MG/ML IJ SOLN
40.0000 mg | Freq: Two times a day (BID) | INTRAMUSCULAR | Status: AC
  Administered 2018-11-30: 40 mg via INTRAVENOUS
  Filled 2018-11-30: qty 4

## 2018-11-30 NOTE — Progress Notes (Signed)
Pt has cane at bedside, also days she has a uterine prolapse disc that she inserted and sometimes it's hard to keep it in. Says when it comes out, she's supposed to wash it and re insert.

## 2018-11-30 NOTE — Progress Notes (Addendum)
Subjective:  Dyspnea improved. C/O Leg cramps  Intake/Output from previous day:  I/O last 3 completed shifts: In: 64 [P.O.:720] Out: 1050 [Urine:1050] Total I/O In: 363 [P.O.:360; I.V.:3] Out: 500 [Urine:500]  Blood pressure (!) 118/55, pulse 88, temperature 97.6 F (36.4 C), temperature source Oral, resp. rate 17, height 4' 10.5" (1.486 m), weight 54.9 kg, SpO2 94 %. Physical Exam  Constitutional: She appears well-developed and well-nourished. No distress.  HENT:  Head: Atraumatic.  Eyes: Conjunctivae are normal.  Neck: Neck supple. No thyromegaly present.  Cardiovascular: Normal rate, regular rhythm, S1 normal and S2 normal. Exam reveals no gallop.  Murmur heard. High-pitched blowing holosystolic murmur is present with a grade of 3/6 at the apex.  Low-pitched early systolic murmur of grade 2/6 is also present at the upper right sternal border radiating to the neck. There is faint bilateral carotid artery bruit.  Distal pulses were normal. Elevated JVD noted.  Pulmonary/Chest: Effort normal. She has rales (bilateral scattered, improved from yesterday).  Abdominal: Soft. Bowel sounds are normal.  Musculoskeletal: Normal range of motion.        General: No edema.  Neurological: She is alert. Abnormal coordination: bilateral scattered soft crackles.  Skin: Skin is warm and dry.  Psychiatric: She has a normal mood and affect.    Lab Results: BMP BNP (last 3 results) Recent Labs    11/29/18 1620 11/30/18 0400  BNP 802.2* 866.7*    ProBNP (last 3 results) No results for input(s): PROBNP in the last 8760 hours. BMP Latest Ref Rng & Units 11/30/2018 11/29/2018 11/29/2018  Glucose 70 - 99 mg/dL 118(H) - 114(H)  BUN 8 - 23 mg/dL 11 - 10  Creatinine 0.44 - 1.00 mg/dL 0.90 1.01(H) 1.01(H)  Sodium 135 - 145 mmol/L 134(L) - 137  Potassium 3.5 - 5.1 mmol/L 3.7 - 3.9  Chloride 98 - 111 mmol/L 102 - 106  CO2 22 - 32 mmol/L 21(L) - 22  Calcium 8.9 - 10.3 mg/dL 8.8(L) - 8.9    Hepatic Function Latest Ref Rng & Units 11/29/2018 10/25/2011 10/16/2007  Total Protein 6.5 - 8.1 g/dL 6.6 7.3 6.1  Albumin 3.5 - 5.0 g/dL 3.5 3.9 2.9(L)  AST 15 - 41 U/L _0 ALT 0 - 44 U/L _1 Alk Phosphatase 38 - 126 U/L 68 75 74  Total Bilirubin 0.3 - 1.2 mg/dL 0.8 0.4 0.8   CBC Latest Ref Rng & Units 11/29/2018 11/29/2018 01/21/2016  WBC 4.0 - 10.5 K/uL 10.0 7.5 9.8  Hemoglobin 12.0 - 15.0 g/dL 12.7 11.4(L) 11.6(L)  Hematocrit 36.0 - 46.0 % 39.0 35.0(L) 37.6  Platelets 150 - 400 K/uL 267 248 342   Lipid Panel     Component Value Date/Time   CHOL 201 (H) 01/22/2016 0447   TRIG 131 01/22/2016 0447   HDL 59 01/22/2016 0447   CHOLHDL 3.4 01/22/2016 0447   VLDL 26 01/22/2016 0447   LDLCALC 116 (H) 01/22/2016 0447   Cardiac Panel (last 3 results) No results for input(s): CKTOTAL, CKMB, TROPONINI, RELINDX in the last 72 hours.  HEMOGLOBIN A1C No results found for: HGBA1C, MPG TSH Recent Labs    11/29/18 1621  TSH 22.297*  Scheduled Meds: . amLODipine  5 mg Oral Daily  . aspirin  81 mg Oral Daily  . atorvastatin  20 mg Oral Daily  . clopidogrel  75 mg Oral Q breakfast  . desipramine  50 mg Oral QHS  . furosemide  40 mg Intravenous Q8H  .  gabapentin  200 mg Oral BID  . heparin  5,000 Units Subcutaneous Q8H  . isosorbide mononitrate  60 mg Oral Daily  . levothyroxine  150 mcg Oral Q0600  . metoprolol succinate  25 mg Oral Daily  . potassium chloride  10 mEq Oral TID  . sodium chloride flush  3 mL Intravenous Q12H   Continuous Infusions: . sodium chloride     PRN Meds:.sodium chloride, acetaminophen, nitroGLYCERIN, ondansetron (ZOFRAN) IV, sodium chloride flush  Cardiac studies:   Tele; NSR  EKG 11/29/2018: Normal sinus rhythm at rate of 72 bpm, left bundle branch block.  Nuclear stress test 01/23/2016: Large fixed defect involving the mid and distal anterior wall, anterior septum and anterolateral wall with hypokinesis, no reversibility identified, LVEF  60%.  Coronary angiogram 12/18/2014:  RPDA lesion, 70% stenosed.  PTCA and stenting of the proximal and mid circumflex with a 3.0 x 38 mm resolute integrity DES for 90% stenosis, stenting of the mid RCA with a 2.75 x 26 mm resolute integrity DES for 99% stenosis, both reduced to 0%.  LM lesion, 10% stenosed.  LVEF 45% with mid to distal anterolateral akinesis, inferolateral mild hypokinesis.  Carotid artery duplex 11/13/2016: Right ICA stenosis of 50 to 69%, lower end of spectrum.  Stenosis of right external carotid artery of less than 50%. Stenosis in the left ICA of 16 to 49%. Antegrade bilateral vertebral artery flow.  No significant change from 05/25/2016.  Assessment:   1.  Acute on chronic diastolic heart failure 2.  Coronary artery disease of the native vessel with probable other forms of angina including heart failure. 3.  Hypertension 4.  Physical exam consistent with severe MR.  New finding since prior examination about 6 months ago. 5.  Markedly abnormal TSH, normal T3 and T4, I have increased synthyroid to 150 mcg from 100, will communicate with Dr. Janie Morning after discharge.  Recommendation:   She is presently doing well, dyspnea has improved, no leg edema, complaints of leg cramps, probably related to magnesium deficiency as potassium levels are normal.  I will replace and add magnesium supplement. Serum troponins negative for acute myocardial injury.  Blood pressure is well controlled.  Echocardiogram is pending.  Continue diuresis, decrease Lasix to twice daily dosing.  Hopefully should be able to go home either tomorrow or day after tomorrow.  I updated her status to her brother Sebastian Ache, M.D. 11/30/2018, 10:57 AM [facility] Pager: [phone] Office: [phone] If no answer: [phone]

## 2018-11-30 NOTE — Progress Notes (Signed)
Pt has small bottle of nitro in pocketbook and says not to take it out. RN educated pt not to take in pills.

## 2018-11-30 NOTE — Progress Notes (Signed)
  Echocardiogram 2D Echocardiogram has been performed.  Candace Ross G Barbaraann Avans 11/30/2018, 1:59 PM

## 2018-11-30 NOTE — Plan of Care (Signed)
  Problem: Education: Goal: Knowledge of General Education information will improve Description: Including pain rating scale, medication(s)/side effects and non-pharmacologic comfort measures Outcome: Progressing   Problem: Clinical Measurements: Goal: Ability to maintain clinical measurements within normal limits will improve Outcome: Progressing   Problem: Clinical Measurements: Goal: Respiratory complications will improve Outcome: Progressing   

## 2018-12-01 DIAGNOSIS — R7989 Other specified abnormal findings of blood chemistry: Secondary | ICD-10-CM

## 2018-12-01 LAB — BASIC METABOLIC PANEL
Anion gap: 10 (ref 5–15)
BUN: 14 mg/dL (ref 8–23)
CO2: 25 mmol/L (ref 22–32)
Calcium: 8.5 mg/dL — ABNORMAL LOW (ref 8.9–10.3)
Chloride: 97 mmol/L — ABNORMAL LOW (ref 98–111)
Creatinine, Ser: 1.13 mg/dL — ABNORMAL HIGH (ref 0.44–1.00)
GFR calc Af Amer: 50 mL/min — ABNORMAL LOW (ref >60)
GFR calc non Af Amer: 43 mL/min — ABNORMAL LOW (ref >60)
Glucose, Bld: 103 mg/dL — ABNORMAL HIGH (ref 70–99)
Potassium: 4.1 mmol/L (ref 3.5–5.1)
Sodium: 132 mmol/L — ABNORMAL LOW (ref 135–145)

## 2018-12-01 LAB — BRAIN NATRIURETIC PEPTIDE: B Natriuretic Peptide: 268.9 pg/mL — ABNORMAL HIGH (ref 0.0–100.0)

## 2018-12-01 MED ORDER — AMLODIPINE BESYLATE 10 MG PO TABS: 10.0000 mg | ORAL_TABLET | Freq: Every evening | ORAL | Status: DC | PRN

## 2018-12-01 MED ORDER — LOSARTAN POTASSIUM 50 MG PO TABS: 25.0000 mg | ORAL_TABLET | Freq: Every day | ORAL | Status: DC

## 2018-12-01 MED ORDER — METOPROLOL SUCCINATE ER 25 MG PO TB24: 25.0000 mg | ORAL_TABLET | Freq: Every day | ORAL | Status: DC

## 2018-12-01 MED ORDER — POTASSIUM CHLORIDE ER 8 MEQ PO TBCR: 8.0000 meq | EXTENDED_RELEASE_TABLET | Freq: Every day | ORAL | 1 refills | Status: DC

## 2018-12-01 MED ORDER — ISOSORBIDE MONONITRATE ER 60 MG PO TB24: 60.0000 mg | ORAL_TABLET | Freq: Every day | ORAL | 2 refills | Status: DC

## 2018-12-01 MED ORDER — FUROSEMIDE 20 MG PO TABS: 20.0000 mg | ORAL_TABLET | Freq: Every day | ORAL | 2 refills | Status: DC

## 2018-12-01 NOTE — Discharge Summary (Signed)
Physician Discharge Summary  Patient ID: Candace Ross MRN: [id number] DOB/AGE: 1927-03-08 85 y.o.  Admit date: 11/29/2018 Discharge date: 12/01/2018  Primary Discharge Diagnosis 1.  Acute on chronic diastolic heart failure 2.  Moderate to severe mitral regurgitation due to degenerative mitral valve disease  Secondary Discharge Diagnosis 3. Coronary artery disease of the native vessel with probable other forms of angina including heart failure. 4. Physical exam consistent with severe MR. New finding since prior examination about 6 months ago.  5. Markedly abnormal TSH, normal T3 and T4  6. Essential hypertension 7. Chronic stage 3 CKD due to hypertension 8. Abnormal TSH  Significant Diagnostic Studies:  Echocardiogram 11/30/2018 :   1. The left ventricle has normal systolic function with an ejection fraction of 60-65%. The cavity size was normal. There is mild concentric left ventricular hypertrophy. Left ventricular diastolic Doppler parameters are consistent with pseudonormalization. Elevated left atrial and left ventricular end-diastolic pressures. Basal septal hypertrophy. No LVOT obstruction. Presence of mitral apparatus calcification makes diastololgy not accurate.  2 Left atrial size was moderately dilated.  3. Mild mitral subvalvular calcification. Moderate mitral valve prolapse. The mitral valve is degenerative. Moderate thickening of the mitral valve leaflet. Moderate calcification of the mitral valve leaflet. Mitral valve regurgitation is moderate to severe by color flow Doppler. The MR jet is eccentric anteriorly directed.  4. Tricuspid valve regurgitation is moderate.  Pulmonary hypertension is moderate. PASP 43 mm Hg.   5. The aortic valve is tricuspid. Mild thickening of the aortic valve. Mild calcification of the aortic valve. Aortic valve regurgitation is trivial by color flow Doppler. There is evidence of mild plaque in the aortic root.  EKG 11/29/2018:Normal sinus  rhythm at rate of 72 bpm, left bundle branch block.  Prior studies:  Nuclear stress test 01/23/2016: Large fixed defect involving the mid and distal anterior wall, anterior septum and anterolateral wall with hypokinesis, no reversibility identified, LVEF 60%.  Coronary angiogram 12/18/2014:  RPDA lesion, 70% stenosed.  PTCA and stenting of the proximal and mid circumflex with a 3.0 x 38 mm resolute integrity DES for 90% stenosis, stenting of the mid RCA with a 2.75 x 26 mm resolute integrity DES for 99% stenosis, both reduced to 0%.  LM lesion, 10% stenosed.  LVEF 45% with mid to distal anterolateral akinesis, inferolateral mild hypokinesis.  Carotid artery duplex06/18/2018: Right ICA stenosis of 50 to 69%, lower end of spectrum. Stenosis of right external carotid artery of less than 50%. Stenosis in the left ICA of 16 to 49%. Antegrade bilateral vertebral artery flow. No significant change from 05/25/2016.  Hospital Course: Patient admitted to the hospital with worsening dyspnea over the past 1 week, found to be hypoxemic on admission needing oxygen supplementation.  She is admitted for diuresis, chest x-ray revealed evidence of pulmonary vascular congestion and also pleural effusion.  She was aggressively diuresed, significant improvement in BNP and essentially asymptomatic, on the day of discharge lungs were clear, she was laying in bed flat without any distress.  She will be followed up in the office in 1 week to 10 days.  Recommendations on discharge:  Patient's medications were changed including taking losartan 25 mg every day instead of PRN, isosorbide mononitrate again to be taken on a daily basis which was previously discontinued, metoprolol succinate 25 mg daily, previously taking on a as needed basis.  Furosemide 20 mg daily along with potassium 8 mEq daily was added.  She has new moderate to severe mitral regurgitation related to  degenerative mitral valve disease.  Would  recommend conservative therapy.  She has abnormal TSH, will forward the discharge summary to Dr. Janie Morning for follow-up.  Discharge Exam: Blood pressure (!) 111/54, pulse 64, temperature 98 F (36.7 C), temperature source Oral, resp. rate 20, height 4' 10.5" (1.486 m), weight 55.6 kg, SpO2 96 %.   Physical Exam  Constitutional: She appears well-developed and well-nourished. No distress.  HENT:  Head: Atraumatic.  Eyes: Conjunctivae are normal.  Neck: Neck supple. No JVD present. No thyromegaly present.  Cardiovascular: Normal rate, regular rhythm, S1 normal and S2 normal. Exam reveals no gallop.  Murmur heard. High-pitched blowing holosystolic murmur is present with a grade of 3/6 at the apex.  Low-pitched early systolic murmur of grade 2/6 is also present at the upper right sternal border radiating to the neck. There is faint bilateral carotid artery bruit.  Distal pulses were normal.   Pulmonary/Chest: Effort normal. No respiratory distress. She has no wheezes. She has no rales.  Abdominal: Soft. Bowel sounds are normal.  Musculoskeletal: Normal range of motion.        General: No edema.  Neurological: She is alert. Abnormal coordination: bilateral scattered soft crackles.  Skin: Skin is warm and dry.  Psychiatric: She has a normal mood and affect.    Labs:   Lab Results  Component Value Date   WBC 10.0 11/29/2018   HGB 12.7 11/29/2018   HCT 39.0 11/29/2018   MCV 90.7 11/29/2018   PLT 267 11/29/2018    Recent Labs  Lab 11/29/18 1620  12/01/18 0414  NA 137   < > 132*  K 3.9   < > 4.1  CL 106   < > 97*  CO2 22   < > 25  BUN 10   < > 14  CREATININE 1.01*   < > 1.13*  CALCIUM 8.9   < > 8.5*  PROT 6.6  --   --   BILITOT 0.8  --   --   ALKPHOS 68  --   --   ALT 24  --   --   AST 19  --   --   GLUCOSE 114*   < > 103*   < > = values in this interval not displayed.    Lipid Panel     Component Value Date/Time   CHOL 201 (H) 01/22/2016 0447   TRIG 131  01/22/2016 0447   HDL 59 01/22/2016 0447   CHOLHDL 3.4 01/22/2016 0447   VLDL 26 01/22/2016 0447   LDLCALC 116 (H) 01/22/2016 0447   BNP (last 3 results) Recent Labs    11/29/18 1620 11/30/18 0400 12/01/18 0414  BNP 802.2* 866.7* 268.9*    HEMOGLOBIN A1C No results found for: HGBA1C, MPG  Cardiac Panel (last 3 results) No results for input(s): CKTOTAL, CKMB, TROPONINI, RELINDX in the last 8760 hours.  Lab Results  Component Value Date   TROPONINI <0.03 01/23/2016     TSH Recent Labs    11/29/18 1621  TSH 22.297*   Radiology: Dg Chest Port 1 View  Result Date: 11/29/2018 CLINICAL DATA:  Shortness of breath. EXAM: PORTABLE CHEST 1 VIEW COMPARISON:  Radiographs of January 21, 2016. FINDINGS: Stable cardiomegaly is noted. Atherosclerosis of thoracic aorta is noted. No pneumothorax is noted. Interval development of bilateral basilar opacities are noted concerning for edema or atelectasis with associated pleural effusions. Bony thorax is unremarkable. IMPRESSION: Interval development of bilateral basilar opacities are noted concerning for edema or atelectasis  with associated pleural effusions. Aortic Atherosclerosis (ICD10-I70.0). Electronically Signed   By: Marijo Conception M.D.   On: 11/29/2018 17:21    FOLLOW UP PLANS AND APPOINTMENTS  Allergies as of 12/01/2018      Reactions   Other Other (See Comments)   "some medicine they gave me in the hospital, it was a little purple pill, to help me sleep didn't work; it made me mean"      Medication List    STOP taking these medications   clopidogrel 75 MG tablet Commonly known as: PLAVIX   desipramine 50 MG tablet Commonly known as: Norpramin   gabapentin 100 MG capsule Commonly known as: NEURONTIN     TAKE these medications   acetaminophen 500 MG tablet Commonly known as: TYLENOL Take 500 mg by mouth every 6 (six) hours as needed for headache (pain).   amLODipine 10 MG tablet Commonly known as: NORVASC Take 1  tablet (10 mg total) by mouth at bedtime as needed (SBP >110).   aspirin 81 MG EC tablet Take 81 mg by mouth daily after breakfast.   atorvastatin 20 MG tablet Commonly known as: LIPITOR TAKE 1 TABLET EVERY DAY What changed: when to take this   dorzolamide 2 % ophthalmic solution Commonly known as: TRUSOPT Place 2 drops into both eyes 2 (two) times daily.   furosemide 20 MG tablet Commonly known as: Lasix Take 1 tablet (20 mg total) by mouth daily.   IRON PO Take 1 tablet by mouth daily.   isosorbide mononitrate 60 MG 24 hr tablet Commonly known as: IMDUR Take 1 tablet (60 mg total) by mouth daily.   latanoprost 0.005 % ophthalmic solution Commonly known as: XALATAN Place 2 drops into both eyes at bedtime.   levothyroxine 100 MCG tablet Commonly known as: SYNTHROID Take 100 mcg by mouth daily before breakfast.   losartan 50 MG tablet Commonly known as: COZAAR Take 0.5 tablets (25 mg total) by mouth daily. What changed:   how much to take  when to take this  reasons to take this   MAGNESIUM PO Take 1 tablet by mouth daily.   metoprolol succinate 25 MG 24 hr tablet Commonly known as: TOPROL-XL Take 1 tablet (25 mg total) by mouth daily. What changed:   how much to take  when to take this  reasons to take this   nitroGLYCERIN 0.4 MG SL tablet Commonly known as: NITROSTAT Place 0.4 mg under the tongue every 5 (five) minutes as needed for chest pain.   potassium chloride 8 MEQ tablet Commonly known as: KLOR-CON Take 1 tablet (8 mEq total) by mouth daily. With furosemide   PRESERVISION AREDS PO Take 1 capsule by mouth daily after breakfast.   VITAMIN B-12 PO Take 1 tablet by mouth daily.   VITAMIN D3 PO Take 1 tablet by mouth daily.   VITAMIN E PO Take 1 capsule by mouth daily.       Adrian Prows, MD 12/01/2018, 11:40 AM  Pager: [phone] Office: [phone] If no answer: [phone]

## 2018-12-01 NOTE — Plan of Care (Signed)

## 2018-12-01 NOTE — Progress Notes (Signed)
Pt was concerned about her zalatan and dorzolamide eye drops, please address, thanks Arvella Nigh RN.

## 2018-12-02 ENCOUNTER — Telehealth: Payer: Self-pay

## 2018-12-12 ENCOUNTER — Ambulatory Visit: Payer: Medicare PPO | Admitting: Cardiology

## 2018-12-12 ENCOUNTER — Other Ambulatory Visit: Payer: Self-pay

## 2018-12-12 ENCOUNTER — Encounter: Payer: Self-pay | Admitting: Cardiology

## 2018-12-12 VITALS — BP 142/66 | HR 76 | Ht 58.5 in | Wt 122.0 lb

## 2018-12-12 DIAGNOSIS — I5031 Acute diastolic (congestive) heart failure: Secondary | ICD-10-CM | POA: Diagnosis not present

## 2018-12-12 DIAGNOSIS — I340 Nonrheumatic mitral (valve) insufficiency: Secondary | ICD-10-CM | POA: Diagnosis not present

## 2018-12-12 DIAGNOSIS — E7800 Pure hypercholesterolemia, unspecified: Secondary | ICD-10-CM | POA: Diagnosis not present

## 2018-12-12 DIAGNOSIS — I25118 Atherosclerotic heart disease of native coronary artery with other forms of angina pectoris: Secondary | ICD-10-CM

## 2018-12-12 DIAGNOSIS — I6523 Occlusion and stenosis of bilateral carotid arteries: Secondary | ICD-10-CM

## 2018-12-12 MED ORDER — ISOSORBIDE MONONITRATE ER 60 MG PO TB24: 60.0000 mg | ORAL_TABLET | Freq: Every day | ORAL | 1 refills | Status: DC

## 2018-12-12 MED ORDER — POTASSIUM CHLORIDE ER 8 MEQ PO TBCR: 8.0000 meq | EXTENDED_RELEASE_TABLET | Freq: Every day | ORAL | 1 refills | Status: DC

## 2018-12-12 MED ORDER — FUROSEMIDE 20 MG PO TABS: 20.0000 mg | ORAL_TABLET | ORAL | 1 refills | Status: DC

## 2018-12-12 MED ORDER — LOSARTAN POTASSIUM 50 MG PO TABS: 50.0000 mg | ORAL_TABLET | Freq: Every day | ORAL | Status: DC

## 2018-12-12 NOTE — Progress Notes (Signed)
Primary Physician/Referring:  Leanna Battles, MD  Patient ID: [name], female    DOB: 14-Oct-1926, 85 y.o.   MRN: [id number]  Subjective   Chief Complaint  Patient presents with  . Coronary Artery Disease  . Follow-up    68yr  . Hospitalization Follow-up  . Congestive Heart Failure    HPI: Candace Ross  is a 85 y.o. female  CAD S/P stenting to proximal and mid circumflex and RCA in July 2017, hypertension, hyperlipidemia, asymptomatic carotid stenosis presents for 1 year follow up. She is a fairly active, living independent,   She was admitted to the hospital on 11/29/2018 and discharged 3 days later with acute decompensated heart failure and was found to have moderately severe mitral regurgitation which is new to her along with moderate pulmonary hypertension.  Patient still has dyspnea on exertion but states that she is gradually improving.  No PND or orthopnea.  She now presents here to the office, states that she has been feeling significantly improved but still has dyspnea on exertion.  No leg edema, no PND or orthopnea.  No hemoptysis.  Although was prescribed medications, she has been making changes stating that his blood pressure is still she has been holding some of the medications including losartan.  Past Medical History:  Diagnosis Date  . Anemia   . Back pain   . Breast cancer ([place])   . Change in stool habits   . Dribbling urine   . H/O bone density study 2009  . Hearing loss   . Hypertension   . Osteoporosis   . Sinus problem   . SOB (shortness of breath) on exertion    stairs  . Spinal stenosis   . Thyroid disease   . Wears dentures    Past Surgical History:  Procedure Laterality Date  . BARTHOLIN GLAND CYST EXCISION  1969  . BREAST LUMPECTOMY  05/02/77   benign  . CARDIAC CATHETERIZATION N/A 12/18/2014   Procedure: Left Heart Cath and Coronary Angiography;  Surgeon: Adrian Prows, MD;  Location: [facility];  Service: Cardiovascular;   Laterality: N/A;  . CARDIAC CATHETERIZATION N/A 12/18/2014   Procedure: Coronary Stent Intervention;  Surgeon: Adrian Prows, MD;  Location: [facility];  Service: Cardiovascular;  Laterality: N/A;  . HERNIA REPAIR  12/15/99   Social History   Socioeconomic History  . Marital status: Widowed    Spouse name: Not on file  . Number of children: 0  . Years of education: Not on file  . Highest education level: Not on file  Occupational History  . Not on file  Social Needs  . Financial resource strain: Not on file  . Food insecurity    Worry: Not on file    Inability: Not on file  . Transportation needs    Medical: Not on file    Non-medical: Not on file  Tobacco Use  . Smoking status: Never Smoker  . Smokeless tobacco: Never Used  Substance and Sexual Activity  . Alcohol use: No  . Drug use: No  . Sexual activity: Not on file  Lifestyle  . Physical activity    Days per week: Not on file    Minutes per session: Not on file  . Stress: Not on file  Relationships  . Social Herbalist on phone: Not on file    Gets together: Not on file    Attends religious service: Not on file    Active  member of club or organization: Not on file    Attends meetings of clubs or organizations: Not on file    Relationship status: Not on file  . Intimate partner violence    Fear of current or ex partner: Not on file    Emotionally abused: Not on file    Physically abused: Not on file    Forced sexual activity: Not on file  Other Topics Concern  . Not on file  Social History Narrative  . Not on file   Review of Systems  Constitution: Negative for chills, decreased appetite, malaise/fatigue and weight gain.  Cardiovascular: Positive for dyspnea on exertion. Negative for leg swelling and syncope.  Endocrine: Negative for cold intolerance.  Hematologic/Lymphatic: Does not bruise/bleed easily.  Musculoskeletal: Positive for joint pain. Negative for joint swelling.   Gastrointestinal: Negative for abdominal pain, anorexia, change in bowel habit, hematochezia and melena.  Neurological: Positive for light-headedness (occasional). Negative for headaches.  Psychiatric/Behavioral: Negative for depression and substance abuse.  All other systems reviewed and are negative.  Objective  Blood pressure (!) 142/66, pulse 76, height 4' 10.5" (1.486 m), weight 122 lb (55.3 kg). Body mass index is 25.06 kg/m.   Physical Exam  Constitutional: She appears well-developed and well-nourished. No distress.  HENT:  Head: Atraumatic.  Eyes: Conjunctivae are normal.  Neck: Neck supple. No JVD present. No thyromegaly present.  Cardiovascular: Normal rate, regular rhythm, S1 normal and S2 normal. Exam reveals no gallop.  Murmur heard. High-pitched blowing holosystolic murmur is present with a grade of 3/6 at the apex.  Low-pitched early systolic murmur of grade 2/6 is also present at the upper right sternal border radiating to the neck. There is faint bilateral carotid artery bruit.  Distal pulses were normal.   Pulmonary/Chest: Effort normal. No respiratory distress. She has no wheezes. She has no rales.  Abdominal: Soft. Bowel sounds are normal.  Musculoskeletal: Normal range of motion.        General: No edema.  Neurological: She is alert. Abnormal coordination: bilateral scattered soft crackles.  Skin: Skin is warm and dry.  Psychiatric: She has a normal mood and affect.   Radiology: No results found.  Laboratory examination:   CMP Latest Ref Rng & Units 12/01/2018 11/30/2018 11/29/2018  Glucose 70 - 99 mg/dL 103(H) 118(H) -  BUN 8 - 23 mg/dL 14 11 -  Creatinine 0.44 - 1.00 mg/dL 1.13(H) 0.90 1.01(H)  Sodium 135 - 145 mmol/L 132(L) 134(L) -  Potassium 3.5 - 5.1 mmol/L 4.1 3.7 -  Chloride 98 - 111 mmol/L 97(L) 102 -  CO2 22 - 32 mmol/L 25 21(L) -  Calcium 8.9 - 10.3 mg/dL 8.5(L) 8.8(L) -  Total Protein 6.5 - 8.1 g/dL - - -  Total Bilirubin 0.3 - 1.2 mg/dL - - -   Alkaline Phos 38 - 126 U/L - - -  AST 15 - 41 U/L - - -  ALT 0 - 44 U/L - - -   CBC Latest Ref Rng & Units 11/29/2018 11/29/2018 01/21/2016  WBC 4.0 - 10.5 K/uL 10.0 7.5 9.8  Hemoglobin 12.0 - 15.0 g/dL 12.7 11.4(L) 11.6(L)  Hematocrit 36.0 - 46.0 % 39.0 35.0(L) 37.6  Platelets 150 - 400 K/uL 267 248 342   Lipid Panel     Component Value Date/Time   CHOL 201 (H) 01/22/2016 0447   TRIG 131 01/22/2016 0447   HDL 59 01/22/2016 0447   CHOLHDL 3.4 01/22/2016 0447   VLDL 26 01/22/2016 0447  LDLCALC 116 (H) 01/22/2016 0447   HEMOGLOBIN A1C No results found for: HGBA1C, MPG TSH Recent Labs    11/29/18 1621  TSH 22.297*    Medications   Current Outpatient Medications  Medication Instructions  . acetaminophen (TYLENOL) 500 mg, Oral, Every 6 hours PRN  . amLODipine (NORVASC) 10 mg, Oral, At bedtime PRN  . aspirin 81 mg, Oral, Daily after breakfast  . atorvastatin (LIPITOR) 20 MG tablet TAKE 1 TABLET EVERY DAY  . Cholecalciferol (VITAMIN D3 PO) 1 tablet, Oral, Daily  . Cyanocobalamin (VITAMIN B-12 PO) 1 tablet, Oral, Daily  . dorzolamide (TRUSOPT) 2 % ophthalmic solution 2 drops, Both Eyes, 2 times daily  . Ferrous Sulfate (IRON PO) 1 tablet, Oral, Daily  . furosemide (LASIX) 20 mg, Oral, Daily  . isosorbide mononitrate (IMDUR) 60 mg, Oral, Daily  . latanoprost (XALATAN) 0.005 % ophthalmic solution 2 drops, Both Eyes, Daily at bedtime  . levothyroxine (SYNTHROID) 100 mcg, Oral, Daily before breakfast  . losartan (COZAAR) 25 mg, Oral, Daily  . MAGNESIUM PO 1 tablet, Oral, Daily  . metoprolol succinate (TOPROL-XL) 25 mg, Oral, Daily  . Multiple Vitamins-Minerals (PRESERVISION AREDS PO) 1 capsule, Oral, Daily after breakfast  . nitroGLYCERIN (NITROSTAT) 0.4 mg, Sublingual, Every 5 min PRN  . potassium chloride (KLOR-CON) 8 MEQ tablet 8 mEq, Oral, Daily, With furosemide  . VITAMIN E PO 1 capsule, Oral, Daily    Cardiac Studies:   Coronary angiogram 12/18/2014: Stenting of  proximal and mid circumflex with a 3.0 x 38 mm resolute integrity DES, 90% stenosis, stenting of the mid RCA with a 2.75 x 26 mm resolute integrity DES, 99% stenosis, both reduced to 0%. LVEF 45% with mid to distal anterolateral akinesis, inferolateral mild hypokinesis.  Nuclear stress test 01/23/2016: Large fixed defect involving the mid and distal anterior wall, anterior septum, and anterior lateral wall with hypokinesis. No reversibility identified. LVEF 60%.  Carotid artery duplex 11/13/2016: Stenosis in the right internal carotid artery (50-69%), lower end of spectrum. Stenosis in the right external carotid artery (<50%). Stenosis in the left internal carotid artery (16-49%). Antegrade right vertebral artery flow. Antegrade left vertebral artery flow. No change from 05/25/2016. Follow up in six months is appropriate if clinically indicated.  Echocardiogram 11/30/2018 :   1. The left ventricle has normal systolic function with an ejection fraction of 60-65%. The cavity size was normal. There is mild concentric left ventricular hypertrophy. Left ventricular diastolic Doppler parameters are consistent with pseudonormalization. Elevated left atrial and left ventricular end-diastolic pressures. Basal septal hypertrophy. No LVOT obstruction. Presence of mitral apparatus calcification makes diastololgy not accurate. 2 Left atrial size was moderately dilated. 3. Mild mitral subvalvular calcification. Moderate mitral valve prolapse. The mitral valve is degenerative. Moderate thickening of the mitral valve leaflet. Moderate calcification of the mitral valve leaflet. Mitral valve regurgitation is moderate to severe by color flow Doppler. The MR jet is eccentric anteriorly directed. 4. Tricuspid valve regurgitation is moderate.  Pulmonary hypertension is moderate. PASP 43 mm Hg.  5. The aortic valve is tricuspid. Mild thickening of the aortic valve. Mild calcification of the aortic valve. Aortic valve  regurgitation is trivial by color flow Doppler. There is evidence of mild plaque in the aortic root.  Assessment     ICD-10-CM   1. Acute diastolic heart failure (HCC)  I50.31   2. Severe mitral regurgitation  I34.0   3. Coronary artery disease of native artery of native heart with stable angina pectoris ([place])  I25.118  12/18/2014: Proximal and mid circumflex 3.0 x 38 mm resolute integrity DES, mid RCA with a 2.75 x 26 mm resolute DES  4. Bilateral carotid artery stenosis  I65.23   5. Hypercholesteremia  E78.00     EKG 11/29/2018:Normal sinus rhythm at rate of 72 bpm, left bundle branch block. No significant change from  EKG 01/02/2018, 11/22/2016.  Recommendations:   Patient admitted with acute on chronic diastolic heart failure on 11/29/2018, she now has new onset of severe mitral regurgitation.  MR appears to be related to degenerative mitral valve disease, cannot exclude atrial fibrillation Leg muscle or a chordee has ruptured as she had clear-cut mitral prolapse as well in spite of moderate to heavy calcification of the mitral valve apparatus.  Do not think she will be a candidate for mitral valve replacement.  Although in acute decompensated heart failure, she had fairly pulmonary edema, symptoms of dyspnea gradually improving.  Patient has been holding medications depending on blood pressure, advised her not to change any of the medications, to take losartan at night to void dizziness and I continued her isosorbide mononitrate as well.  With regard to peripheral arterial disease and carotid disease, conservative therapy for now in view of advanced age.  Hyperlipidemia was noted during hospitalization is now on high-intensity low to moderate dose Lipitor. I also advised her to take extra dose of furosemide if she were to get worsening dyspnea or leg edema.  I'll like to see her back in 6 weeks.  Adrian Prows, MD, [facility] 12/12/2018, 3:43 PM [facility] [place] Pager: [phone]  Office: [phone] If no answer Cell [phone]

## 2018-12-23 ENCOUNTER — Other Ambulatory Visit: Payer: Self-pay | Admitting: Cardiology

## 2018-12-23 DIAGNOSIS — I5031 Acute diastolic (congestive) heart failure: Secondary | ICD-10-CM

## 2019-01-03 ENCOUNTER — Encounter: Payer: Self-pay | Admitting: Cardiology

## 2019-01-03 ENCOUNTER — Ambulatory Visit: Payer: Medicare PPO | Admitting: Cardiology

## 2019-01-03 ENCOUNTER — Other Ambulatory Visit: Payer: Self-pay

## 2019-01-03 VITALS — BP 142/62 | HR 72 | Ht <= 58 in | Wt 119.0 lb

## 2019-01-03 DIAGNOSIS — I340 Nonrheumatic mitral (valve) insufficiency: Secondary | ICD-10-CM

## 2019-01-03 DIAGNOSIS — I5031 Acute diastolic (congestive) heart failure: Secondary | ICD-10-CM

## 2019-01-03 MED ORDER — FUROSEMIDE 20 MG PO TABS: 20.0000 mg | ORAL_TABLET | Freq: Two times a day (BID) | ORAL | 1 refills | Status: DC

## 2019-01-03 MED ORDER — POTASSIUM CHLORIDE ER 8 MEQ PO TBCR: 8.0000 meq | EXTENDED_RELEASE_TABLET | Freq: Two times a day (BID) | ORAL | 1 refills | Status: DC

## 2019-01-03 NOTE — Progress Notes (Signed)
Primary Physician/Referring:  Buzzy Han, MD  Patient ID: [name], female    DOB: 1926-06-10, 85 y.o.   MRN: [id number]  Subjective   Chief Complaint  Patient presents with  . Congestive Heart Failure  . Mitral Regurgitation  . Follow-up    HPI: Candace Ross  is a 85 y.o. female  CAD S/P stenting to proximal and mid circumflex and RCA in July 6967, acute diastolic CHF on 01/04/37 due to new moderate to severe MR, hypertension, hyperlipidemia, asymptomatic carotid stenosis presents for 1 month follow up. She is a fairly active, living independent,   Patient still has dyspnea on exertion but states that she is gradually improving.  No PND or orthopnea. She has learned To take extra doses of furosemide with significant improvement in dyspnea.  States that she has increased her furosemide to taking it b.i.d.  Since then she has been able to do all her routine activities and household chores without significant limitations. Past Medical History:  Diagnosis Date  . Anemia   . Back pain   . Breast cancer ([place])   . Change in stool habits   . Dribbling urine   . H/O bone density study 2009  . Hearing loss   . Hypertension   . Osteoporosis   . Sinus problem   . SOB (shortness of breath) on exertion    stairs  . Spinal stenosis   . Thyroid disease   . Wears dentures    Past Surgical History:  Procedure Laterality Date  . BARTHOLIN GLAND CYST EXCISION  1969  . BREAST LUMPECTOMY  05/02/77   benign  . CARDIAC CATHETERIZATION N/A 12/18/2014   Procedure: Left Heart Cath and Coronary Angiography;  Surgeon: Adrian Prows, MD;  Location: [facility];  Service: Cardiovascular;  Laterality: N/A;  . CARDIAC CATHETERIZATION N/A 12/18/2014   Procedure: Coronary Stent Intervention;  Surgeon: Adrian Prows, MD;  Location: [facility];  Service: Cardiovascular;  Laterality: N/A;  . HERNIA REPAIR  12/15/99   Social History   Socioeconomic History  . Marital status:  Widowed    Spouse name: Not on file  . Number of children: 0  . Years of education: Not on file  . Highest education level: Not on file  Occupational History  . Not on file  Social Needs  . Financial resource strain: Not on file  . Food insecurity    Worry: Not on file    Inability: Not on file  . Transportation needs    Medical: Not on file    Non-medical: Not on file  Tobacco Use  . Smoking status: Never Smoker  . Smokeless tobacco: Never Used  Substance and Sexual Activity  . Alcohol use: No  . Drug use: No  . Sexual activity: Not on file  Lifestyle  . Physical activity    Days per week: Not on file    Minutes per session: Not on file  . Stress: Not on file  Relationships  . Social Herbalist on phone: Not on file    Gets together: Not on file    Attends religious service: Not on file    Active member of club or organization: Not on file    Attends meetings of clubs or organizations: Not on file    Relationship status: Not on file  . Intimate partner violence    Fear of current or ex partner: Not on file    Emotionally abused: Not  on file    Physically abused: Not on file    Forced sexual activity: Not on file  Other Topics Concern  . Not on file  Social History Narrative  . Not on file   Review of Systems  Constitution: Negative for chills, decreased appetite, malaise/fatigue and weight gain.  Cardiovascular: Positive for dyspnea on exertion. Negative for leg swelling and syncope.  Endocrine: Negative for cold intolerance.  Hematologic/Lymphatic: Does not bruise/bleed easily.  Musculoskeletal: Positive for joint pain. Negative for joint swelling.  Gastrointestinal: Negative for abdominal pain, anorexia, change in bowel habit, hematochezia and melena.  Neurological: Positive for light-headedness (occasional). Negative for headaches.  Psychiatric/Behavioral: Negative for depression and substance abuse.  All other systems reviewed and are negative.   Objective  Blood pressure (!) 142/62, pulse 72, height 4\' 10"  (1.473 m), weight 119 lb (54 kg), SpO2 98 %. Body mass index is 24.87 kg/m.   Physical Exam  Constitutional: She appears well-developed and well-nourished. No distress.  HENT:  Head: Atraumatic.  Eyes: Conjunctivae are normal.  Neck: Neck supple. No JVD present. No thyromegaly present.  Cardiovascular: Normal rate, regular rhythm, S1 normal and S2 normal. Exam reveals no gallop.  Murmur heard. High-pitched blowing holosystolic murmur is present with a grade of 3/6 at the apex.  Low-pitched early systolic murmur of grade 2/6 is also present at the upper right sternal border radiating to the neck. There is faint bilateral carotid artery bruit.  Distal pulses were normal.  No leg edema.  Pulmonary/Chest: Effort normal. No respiratory distress. She has no wheezes. She has no rales.  Abdominal: Soft. Bowel sounds are normal.  Musculoskeletal: Normal range of motion.  Neurological: She is alert.  Skin: Skin is warm and dry.  Psychiatric: She has a normal mood and affect.   Radiology: No results found.  Laboratory examination:   CMP Latest Ref Rng & Units 12/01/2018 11/30/2018 11/29/2018  Glucose 70 - 99 mg/dL 103(H) 118(H) -  BUN 8 - 23 mg/dL 14 11 -  Creatinine 0.44 - 1.00 mg/dL 1.13(H) 0.90 1.01(H)  Sodium 135 - 145 mmol/L 132(L) 134(L) -  Potassium 3.5 - 5.1 mmol/L 4.1 3.7 -  Chloride 98 - 111 mmol/L 97(L) 102 -  CO2 22 - 32 mmol/L 25 21(L) -  Calcium 8.9 - 10.3 mg/dL 8.5(L) 8.8(L) -  Total Protein 6.5 - 8.1 g/dL - - -  Total Bilirubin 0.3 - 1.2 mg/dL - - -  Alkaline Phos 38 - 126 U/L - - -  AST 15 - 41 U/L - - -  ALT 0 - 44 U/L - - -   CBC Latest Ref Rng & Units 11/29/2018 11/29/2018 01/21/2016  WBC 4.0 - 10.5 K/uL 10.0 7.5 9.8  Hemoglobin 12.0 - 15.0 g/dL 12.7 11.4(L) 11.6(L)  Hematocrit 36.0 - 46.0 % 39.0 35.0(L) 37.6  Platelets 150 - 400 K/uL 267 248 342   Lipid Panel     Component Value Date/Time   CHOL 201 (H)  01/22/2016 0447   TRIG 131 01/22/2016 0447   HDL 59 01/22/2016 0447   CHOLHDL 3.4 01/22/2016 0447   VLDL 26 01/22/2016 0447   LDLCALC 116 (H) 01/22/2016 0447   HEMOGLOBIN A1C No results found for: HGBA1C, MPG TSH Recent Labs    11/29/18 1621  TSH 22.297*    Medications   Current Outpatient Medications  Medication Instructions  . acetaminophen (TYLENOL) 500 mg, Oral, Every 6 hours PRN  . aspirin 81 mg, Oral, Daily after breakfast  . atorvastatin (  LIPITOR) 20 MG tablet TAKE 1 TABLET EVERY DAY  . Cholecalciferol (VITAMIN D3 PO) 1 tablet, Oral, Daily  . Cyanocobalamin (VITAMIN B-12 PO) 1 tablet, Oral, Daily  . dorzolamide (TRUSOPT) 2 % ophthalmic solution 2 drops, Both Eyes, 2 times daily  . Ferrous Sulfate (IRON PO) 1 tablet, Oral, Daily  . furosemide (LASIX) 20 mg, Oral, 2 times daily  . isosorbide mononitrate (IMDUR) 60 mg, Oral, Daily  . latanoprost (XALATAN) 0.005 % ophthalmic solution 2 drops, Both Eyes, Daily at bedtime  . levothyroxine (SYNTHROID) 100 mcg, Oral, Daily before breakfast  . losartan (COZAAR) 50 mg, Oral, Daily at bedtime  . MAGNESIUM PO 1 tablet, Oral, Daily  . metoprolol succinate (TOPROL-XL) 25 mg, Oral, Daily  . Multiple Vitamins-Minerals (PRESERVISION AREDS PO) 1 capsule, Oral, Daily after breakfast  . nitroGLYCERIN (NITROSTAT) 0.4 mg, Sublingual, Every 5 min PRN  . potassium chloride (KLOR-CON) 8 MEQ tablet 8 mEq, Oral, 2 times daily, With furosemide  . VITAMIN E PO 1 capsule, Oral, Daily    Cardiac Studies:   Coronary angiogram 12/18/2014: Stenting of proximal and mid circumflex with a 3.0 x 38 mm resolute integrity DES, 90% stenosis, stenting of the mid RCA with a 2.75 x 26 mm resolute integrity DES, 99% stenosis, both reduced to 0%. LVEF 45% with mid to distal anterolateral akinesis, inferolateral mild hypokinesis.  Nuclear stress test 01/23/2016: Large fixed defect involving the mid and distal anterior wall, anterior septum, and anterior lateral  wall with hypokinesis. No reversibility identified. LVEF 60%.  Carotid artery duplex 11/13/2016: Stenosis in the right internal carotid artery (50-69%), lower end of spectrum. Stenosis in the right external carotid artery (<50%). Stenosis in the left internal carotid artery (16-49%). Antegrade right vertebral artery flow. Antegrade left vertebral artery flow. No change from 05/25/2016. Follow up in six months is appropriate if clinically indicated.  Echocardiogram 11/30/2018 :   1. The left ventricle has normal systolic function with an ejection fraction of 60-65%. The cavity size was normal. There is mild concentric left ventricular hypertrophy. Left ventricular diastolic Doppler parameters are consistent with pseudonormalization. Elevated left atrial and left ventricular end-diastolic pressures. Basal septal hypertrophy. No LVOT obstruction. Presence of mitral apparatus calcification makes diastololgy not accurate. 2 Left atrial size was moderately dilated. 3. Mild mitral subvalvular calcification. Moderate mitral valve prolapse. The mitral valve is degenerative. Moderate thickening of the mitral valve leaflet. Moderate calcification of the mitral valve leaflet. Mitral valve regurgitation is moderate to severe by color flow Doppler. The MR jet is eccentric anteriorly directed. 4. Tricuspid valve regurgitation is moderate.  Pulmonary hypertension is moderate. PASP 43 mm Hg.  5. The aortic valve is tricuspid. Mild thickening of the aortic valve. Mild calcification of the aortic valve. Aortic valve regurgitation is trivial by color flow Doppler. There is evidence of mild plaque in the aortic root.  Assessment     ICD-10-CM   1. Severe mitral regurgitation  I34.0   2. Acute diastolic heart failure (HCC)  I50.31 furosemide (LASIX) 20 MG tablet    potassium chloride (KLOR-CON) 8 MEQ tablet    EKG 11/29/2018:Normal sinus rhythm at rate of 72 bpm, left bundle branch block. No significant change  from  EKG 01/02/2018, 11/22/2016.  Recommendations:   Patient was [place] here for a 4-6 follow-up of acute decompensated heart failure, she has done remarkably well and has recuperated well and is gone back to living independently and she walked in for office.    She has been using furosemide twice  a day due to acute onset dyspnea, I suspect she still has mild form of acute on chronic diastolic heart failure related to mitral regurgitation.  I increased the dose of Lasix and also potassium to b.i.d. dosing.  I'm pleasantly surprised at how well she is doing.  I'd like to see her back in 3 months.  Adrian Prows, MD, [facility] 01/03/2019, 1:45 PM [facility]. [place] Pager: [phone] Office: [phone] If no answer Cell [phone]

## 2019-01-05 ENCOUNTER — Encounter: Payer: Self-pay | Admitting: Cardiology

## 2019-01-08 ENCOUNTER — Ambulatory Visit: Payer: Self-pay | Admitting: Cardiology

## 2019-03-05 ENCOUNTER — Other Ambulatory Visit: Payer: Self-pay | Admitting: Cardiology

## 2019-04-10 ENCOUNTER — Other Ambulatory Visit: Payer: Self-pay

## 2019-04-10 ENCOUNTER — Ambulatory Visit: Payer: Medicare PPO | Admitting: Cardiology

## 2019-04-10 ENCOUNTER — Encounter: Payer: Self-pay | Admitting: Cardiology

## 2019-04-10 VITALS — BP 139/65 | HR 78 | Temp 98.3°F | Ht 58.5 in | Wt 118.4 lb

## 2019-04-10 DIAGNOSIS — I25118 Atherosclerotic heart disease of native coronary artery with other forms of angina pectoris: Secondary | ICD-10-CM | POA: Diagnosis not present

## 2019-04-10 DIAGNOSIS — I5032 Chronic diastolic (congestive) heart failure: Secondary | ICD-10-CM

## 2019-04-10 DIAGNOSIS — I340 Nonrheumatic mitral (valve) insufficiency: Secondary | ICD-10-CM | POA: Diagnosis not present

## 2019-04-10 NOTE — Progress Notes (Addendum)
Primary Physician/Referring:  Buzzy Han, MD  Patient ID: Candace Ross, female    DOB: 01/06/1927, 85 y.o.   MRN: [id number]  Subjective   Chief Complaint  Patient presents with  . Congestive Heart Failure  . Mitral Regurgitation  . Follow-up    3 month    HPI: Candace Ross  is a 85 y.o. female  CAD S/P stenting to proximal and mid circumflex and RCA in July 2202, acute diastolic CHF on 09/29/25 due to new moderate to severe MR, hypertension, hyperlipidemia, asymptomatic carotid stenosis presents for 1 month follow up. She is a fairly active, living independent,   Patient still has dyspnea on exertion but states that she almost back to baseline.  No PND or orthopnea. Shetakes extra doses of furosemide with significant improvement in dyspnea.  She has been able to do all her routine activities and household chores without significant limitations. No PND or orthopnea.  Past Medical History:  Diagnosis Date  . Anemia   . Back pain   . Breast cancer ([place])   . Change in stool habits   . Dribbling urine   . H/O bone density study 2009  . Hearing loss   . Hypertension   . Osteoporosis   . Sinus problem   . SOB (shortness of breath) on exertion    stairs  . Spinal stenosis   . Thyroid disease   . Wears dentures    Past Surgical History:  Procedure Laterality Date  . BARTHOLIN GLAND CYST EXCISION  1969  . BREAST LUMPECTOMY  05/02/77   benign  . CARDIAC CATHETERIZATION N/A 12/18/2014   Procedure: Left Heart Cath and Coronary Angiography;  Surgeon: Adrian Prows, MD;  Location: [facility];  Service: Cardiovascular;  Laterality: N/A;  . CARDIAC CATHETERIZATION N/A 12/18/2014   Procedure: Coronary Stent Intervention;  Surgeon: Adrian Prows, MD;  Location: [place];  Service: Cardiovascular;  Laterality: N/A;  . HERNIA REPAIR  12/15/99   Social History   Socioeconomic History  . Marital status: Widowed    Spouse name: Not on file  . Number of  children: 1  . Years of education: Not on file  . Highest education level: Not on file  Occupational History  . Not on file  Social Needs  . Financial resource strain: Not on file  . Food insecurity    Worry: Not on file    Inability: Not on file  . Transportation needs    Medical: Not on file    Non-medical: Not on file  Tobacco Use  . Smoking status: Never Smoker  . Smokeless tobacco: Never Used  Substance and Sexual Activity  . Alcohol use: No  . Drug use: No  . Sexual activity: Not on file  Lifestyle  . Physical activity    Days per week: Not on file    Minutes per session: Not on file  . Stress: Not on file  Relationships  . Social Herbalist on phone: Not on file    Gets together: Not on file    Attends religious service: Not on file    Active member of club or organization: Not on file    Attends meetings of clubs or organizations: Not on file    Relationship status: Not on file  . Intimate partner violence    Fear of current or ex partner: Not on file    Emotionally abused: Not on file    Physically abused:  Not on file    Forced sexual activity: Not on file  Other Topics Concern  . Not on file  Social History Narrative  . Not on file   Review of Systems  Constitution: Negative for chills, decreased appetite, malaise/fatigue and weight gain.  Cardiovascular: Positive for dyspnea on exertion. Negative for leg swelling and syncope.  Endocrine: Negative for cold intolerance.  Hematologic/Lymphatic: Does not bruise/bleed easily.  Musculoskeletal: Positive for joint pain. Negative for joint swelling.  Gastrointestinal: Negative for abdominal pain, anorexia, change in bowel habit, hematochezia and melena.  Neurological: Positive for light-headedness (occasional). Negative for headaches.  Psychiatric/Behavioral: Negative for depression and substance abuse.  All other systems reviewed and are negative.  Objective  Blood pressure 139/65, pulse 78,  temperature 98.3 F (36.8 C), height 4' 10.5" (1.486 m), weight 118 lb 6.4 oz (53.7 kg), SpO2 95 %. Body mass index is 24.32 kg/m.   Physical Exam  Constitutional: She appears well-developed and well-nourished. No distress.  HENT:  Head: Atraumatic.  Eyes: Conjunctivae are normal.  Neck: Neck supple. No JVD present. No thyromegaly present.  Cardiovascular: Normal rate, regular rhythm, S1 normal and S2 normal. Exam reveals no gallop.  Murmur heard. High-pitched blowing holosystolic murmur is present with a grade of 3/6 at the apex.  Low-pitched early systolic murmur of grade 2/6 is also present at the upper right sternal border radiating to the neck. There is faint bilateral carotid artery bruit.  Distal pulses were normal.  No leg edema.  Pulmonary/Chest: Effort normal. No respiratory distress. She has no wheezes. She has no rales.  Abdominal: Soft. Bowel sounds are normal.  Musculoskeletal: Normal range of motion.  Neurological: She is alert.  Skin: Skin is warm and dry.  Psychiatric: She has a normal mood and affect.   Radiology: No results found.  Laboratory examination:   CMP Latest Ref Rng & Units 12/01/2018 11/30/2018 11/29/2018  Glucose 70 - 99 mg/dL 103(H) 118(H) -  BUN 8 - 23 mg/dL 14 11 -  Creatinine 0.44 - 1.00 mg/dL 1.13(H) 0.90 1.01(H)  Sodium 135 - 145 mmol/L 132(L) 134(L) -  Potassium 3.5 - 5.1 mmol/L 4.1 3.7 -  Chloride 98 - 111 mmol/L 97(L) 102 -  CO2 22 - 32 mmol/L 25 21(L) -  Calcium 8.9 - 10.3 mg/dL 8.5(L) 8.8(L) -  Total Protein 6.5 - 8.1 g/dL - - -  Total Bilirubin 0.3 - 1.2 mg/dL - - -  Alkaline Phos 38 - 126 U/L - - -  AST 15 - 41 U/L - - -  ALT 0 - 44 U/L - - -   CBC Latest Ref Rng & Units 11/29/2018 11/29/2018 01/21/2016  WBC 4.0 - 10.5 K/uL 10.0 7.5 9.8  Hemoglobin 12.0 - 15.0 g/dL 12.7 11.4(L) 11.6(L)  Hematocrit 36.0 - 46.0 % 39.0 35.0(L) 37.6  Platelets 150 - 400 K/uL 267 248 342   Lipid Panel     Component Value Date/Time   CHOL 201 (H)  01/22/2016 0447   TRIG 131 01/22/2016 0447   HDL 59 01/22/2016 0447   CHOLHDL 3.4 01/22/2016 0447   VLDL 26 01/22/2016 0447   LDLCALC 116 (H) 01/22/2016 0447   HEMOGLOBIN A1C No results found for: HGBA1C, MPG TSH Recent Labs    11/29/18 1621  TSH 22.297*    Medications   Current Outpatient Medications  Medication Instructions  . acetaminophen (TYLENOL) 500 mg, Oral, Every 6 hours PRN  . aspirin 81 mg, Oral, Daily after breakfast  . atorvastatin (LIPITOR)  20 MG tablet TAKE 1 TABLET EVERY DAY  . Cholecalciferol (VITAMIN D3 PO) 1 tablet, Oral, Daily  . Cyanocobalamin (VITAMIN B-12 PO) 1 tablet, Oral, Daily  . dorzolamide (TRUSOPT) 2 % ophthalmic solution 2 drops, Both Eyes, 2 times daily  . Ferrous Sulfate (IRON PO) 1 tablet, Oral, Daily  . furosemide (LASIX) 20 mg, Oral, 2 times daily  . isosorbide mononitrate (IMDUR) 60 mg, Oral, Daily  . latanoprost (XALATAN) 0.005 % ophthalmic solution 2 drops, Both Eyes, Daily at bedtime  . levothyroxine (SYNTHROID) 100 mcg, Oral, Daily before breakfast  . losartan (COZAAR) 50 mg, Oral, Daily at bedtime  . MAGNESIUM PO 1 tablet, Oral, Daily  . metoprolol succinate (TOPROL-XL) 25 mg, Oral, Daily  . Multiple Vitamins-Minerals (PRESERVISION AREDS PO) 1 capsule, Oral, Daily after breakfast  . nitroGLYCERIN (NITROSTAT) 0.4 mg, Sublingual, Every 5 min PRN  . potassium chloride (KLOR-CON) 8 MEQ tablet 8 mEq, Oral, 2 times daily, With furosemide  . VITAMIN E PO 1 capsule, Oral, Daily    Cardiac Studies:   Coronary angiogram 12/18/2014: Stenting of proximal and mid circumflex with a 3.0 x 38 mm resolute integrity DES, 90% stenosis, stenting of the mid RCA with a 2.75 x 26 mm resolute integrity DES, 99% stenosis, both reduced to 0%. LVEF 45% with mid to distal anterolateral akinesis, inferolateral mild hypokinesis.  Nuclear stress test 01/23/2016: Large fixed defect involving the mid and distal anterior wall, anterior septum, and anterior lateral  wall with hypokinesis. No reversibility identified. LVEF 60%.  Carotid artery duplex 11/13/2016: Stenosis in the right internal carotid artery (50-69%), lower end of spectrum. Stenosis in the right external carotid artery (<50%). Stenosis in the left internal carotid artery (16-49%). Antegrade right vertebral artery flow. Antegrade left vertebral artery flow. No change from 05/25/2016. Follow up in six months is appropriate if clinically indicated.  Echocardiogram 11/30/2018 :   1. The left ventricle has normal systolic function with an ejection fraction of 60-65%. The cavity size was normal. There is mild concentric left ventricular hypertrophy. Left ventricular diastolic Doppler parameters are consistent with pseudonormalization. Elevated left atrial and left ventricular end-diastolic pressures. Basal septal hypertrophy. No LVOT obstruction. Presence of mitral apparatus calcification makes diastololgy not accurate. 2 Left atrial size was moderately dilated. 3. Mild mitral subvalvular calcification. Moderate mitral valve prolapse. The mitral valve is degenerative. Moderate thickening of the mitral valve leaflet. Moderate calcification of the mitral valve leaflet. Mitral valve regurgitation is moderate to severe by color flow Doppler. The MR jet is eccentric anteriorly directed. 4. Tricuspid valve regurgitation is moderate.  Pulmonary hypertension is moderate. PASP 43 mm Hg.  5. The aortic valve is tricuspid. Mild thickening of the aortic valve. Mild calcification of the aortic valve. Aortic valve regurgitation is trivial by color flow Doppler. There is evidence of mild plaque in the aortic root.  Assessment     ICD-10-CM   1. Severe mitral regurgitation  I34.0   2. Coronary artery disease of native artery of native heart with stable angina pectoris ([place])  I25.118 CMP14+EGFR    CBC  3. Chronic diastolic (congestive) heart failure (HCC)  I50.32 TSH+T4F+T3Free    EKG 11/29/2018:Normal sinus  rhythm at rate of 72 bpm, left bundle branch block. No significant change from  EKG 01/02/2018, 11/22/2016.  Recommendations:   Candace Ross  is a 85 y.o. female  CAD S/P stenting to proximal and mid circumflex and RCA in July 7616, acute diastolic CHF on 0/7/37 due to new moderate to severe  MR, hypertension, hyperlipidemia, asymptomatic carotid stenosis presents for 1 month follow up. She is a fairly active, living independent,   Patient was [place] here for a 3 month follow-up of acute decompensated heart failure, she has done remarkably well and has recuperated well and is gone back to living independently.  I'm pleasantly surprised at how well she is doing.  She is not a candidate for Mitraclip in view of severe Mitral valve apparatus calcification.  I will obtain CMP, CBC and Thyroid studies as she has not had repeat study and TSH was markedly abnormal during hospitalization although T4 was normal. I'd like to see her back in 3 months.  Adrian Prows, MD, [facility] 04/10/2019, 2:44 PM [facility]. [place] Pager: [phone] Office: [phone] If no answer Cell [phone]  Addendum:  04/12/19  I have discussed the findings of the labs with the patient.  I reviewed the echocardiogram with Dr. Sherren Mocha to see whether she would be a candidate for minimally invasive surgery versus mitral clip.  He would like to proceed with TEE and thinks she may be a good candidate.  Once TEE is reviewed he will make a final decision whether he should see her in consultation for possible mitra-clip.  Patient is willing to proceed with TEE and understands the risk associated with this including aspiration pneumonia and perforation.

## 2019-04-12 LAB — CMP14+EGFR
ALT: 9 IU/L (ref 0–32)
AST: 17 IU/L (ref 0–40)
Albumin/Globulin Ratio: 2 (ref 1.2–2.2)
Albumin: 4.7 g/dL — ABNORMAL HIGH (ref 3.5–4.6)
Alkaline Phosphatase: 89 IU/L (ref 39–117)
BUN/Creatinine Ratio: 15 (ref 12–28)
BUN: 15 mg/dL (ref 10–36)
Bilirubin Total: 0.6 mg/dL (ref 0.0–1.2)
CO2: 24 mmol/L (ref 20–29)
Calcium: 9.6 mg/dL (ref 8.7–10.3)
Chloride: 96 mmol/L (ref 96–106)
Creatinine, Ser: 1.01 mg/dL — ABNORMAL HIGH (ref 0.57–1.00)
GFR calc Af Amer: 57 mL/min/1.73 — ABNORMAL LOW (ref >59)
GFR calc non Af Amer: 49 mL/min/1.73 — ABNORMAL LOW (ref >59)
Globulin, Total: 2.3 g/dL (ref 1.5–4.5)
Glucose: 97 mg/dL (ref 65–99)
Potassium: 4 mmol/L (ref 3.5–5.2)
Sodium: 134 mmol/L (ref 134–144)
Total Protein: 7 g/dL (ref 6.0–8.5)

## 2019-04-12 LAB — CBC
Hematocrit: 37.4 % (ref 34.0–46.6)
Hemoglobin: 12.2 g/dL (ref 11.1–15.9)
MCH: 28.3 pg (ref 26.6–33.0)
MCHC: 32.6 g/dL (ref 31.5–35.7)
MCV: 87 fL (ref 79–97)
Platelets: 264 10*3/uL (ref 150–450)
RBC: 4.31 x10E6/uL (ref 3.77–5.28)
RDW: 13.2 % (ref 11.7–15.4)
WBC: 7.1 10*3/uL (ref 3.4–10.8)

## 2019-04-12 LAB — TSH+T4F+T3FREE
Free T4: 1.76 ng/dL (ref 0.82–1.77)
T3, Free: 2.3 pg/mL (ref 2.0–4.4)
TSH: 3.16 u[IU]/mL (ref 0.450–4.500)

## 2019-04-21 ENCOUNTER — Other Ambulatory Visit: Payer: Self-pay | Admitting: Cardiology

## 2019-04-21 DIAGNOSIS — I25118 Atherosclerotic heart disease of native coronary artery with other forms of angina pectoris: Secondary | ICD-10-CM

## 2019-04-23 ENCOUNTER — Telehealth: Payer: Self-pay | Admitting: Cardiology

## 2019-04-24 NOTE — Telephone Encounter (Signed)
When I signed off on my notes, I indicated that she needs TEE to evaluate mitral regurgitation. Did it now flow through?? Let me know. She just needs to be set up for a TEE and she knows why this is for, she is calling because we have not scheduled this.   JG

## 2019-04-28 NOTE — Telephone Encounter (Signed)
Can you follow up on this to see if TEE is scheduled and if not, schedule this. If she still has questions, I can talk to her then.

## 2019-04-30 NOTE — Telephone Encounter (Signed)
I discussed with the patient again over the telephone, she remains mildly symptomatic with dyspnea and she is quite aware that she would not want to have major surgery including open heart surgery due to her advanced age.  However she is willing for MitraClip procedure if felt feasible.  I did discuss with Dr. Sherren Mocha who also advised obtaining a TEE first and then decide upon further course.  After discussion, patient is willing to go through the procedure, TEE, she would like to have it done on a Friday as her nephew will be with her.  I will try to set this up on 05/09/2019.  Patient understands the risks and benefits of TEE and is willing to proceed.  I will cancel tomorrow's appointment with me that was set up urgently to discuss need for TEE.  Adrian Prows, MD, [facility] 04/30/2019, 3:15 PM [facility]. [place] Pager: [phone] Office: [phone] If no answer Cell [phone]

## 2019-04-30 NOTE — Telephone Encounter (Signed)
Patient stated she was supposed to talk to [facility] per their last conversation before thanksgiving about this "surgery". She said if its not  Surgery she did not want to have done. Said she has transportation issues and needs to arrange this before making any appointments. Staff did contact pt multiple times, I sch her with you to educate patient and sch TEE tomorrow once she checks out. Thanks!!

## 2019-05-01 ENCOUNTER — Ambulatory Visit: Payer: Medicare PPO | Admitting: Cardiology

## 2019-05-01 NOTE — Telephone Encounter (Signed)
No answer from patient.- vm left for patient to call us back-   -[place]

## 2019-05-02 ENCOUNTER — Encounter (HOSPITAL_COMMUNITY): Payer: Self-pay | Admitting: Physician Assistant

## 2019-05-02 NOTE — Telephone Encounter (Signed)
Dr Einar Gip, I need your assistance with [facility].   TEE scheduled for 05/09/19 @ 7:30am with 6:30am arrival [facility] will call her next week to provide details) Covid test scheduled for 05/06/19 @ 1:00 pm  TEE fu with [place] scheduled for 05/21/19 @ 1:45pm   I called the patient but patient is confused. Mentioned she does not need surgery, only ultrasound. I tried explaining her what a TEE is, but it didn't help. Her son is not with her.   Thank you!

## 2019-05-02 NOTE — Telephone Encounter (Signed)
Go ahead and book for next Fri early morning so we have a spot.

## 2019-05-05 ENCOUNTER — Telehealth: Payer: Self-pay | Admitting: Cardiology

## 2019-05-05 NOTE — Telephone Encounter (Signed)
Patient was called, She will keep her 06/30/2019 fu appt with you.   Mentioned she can do anytime after January as her stepdaughter will be in town and can take her, she will let us know which date works best for her.

## 2019-05-05 NOTE — Telephone Encounter (Signed)
Hi Dr Einar Gip, I am working on her prior authorization for her TEE this Friday. Just in case her insurance calls her or she receives any kind of letter from insurance, were you able to contact patient? She was really confused about her TEE. I had sent you a mesg on 12/4.  I scheduled her  Covid test for 05/06/19 at 1 pm @ [address].  Her fu with you is on 05/21/19 at 1:45pm   Thank you kindly!   -Leda Quail

## 2019-05-05 NOTE — Telephone Encounter (Signed)
I have documented in the chart. Procedure cancelled. She though she will also have surgery same day is what she was told!!.  Probably best to do medical therapy.   Set up OV in 2-3 months and let her know.

## 2019-05-05 NOTE — Telephone Encounter (Signed)
I have had multiple discussions with the patient and she seems to understand that she is only going for a TEE to surgery.  I have also advised her that the feasibility for procedure for mitral valve repair would depend on TEE findings.  After 10-15 minutes of discussion, she tells me that she does not have anyone to stay with her overnight.  We will go ahead and cancel the procedure for now and she what arrangements can be done whether she has to stay overnight after TEE which I would possibly like to avoid in view of Covid 19.

## 2019-05-06 ENCOUNTER — Other Ambulatory Visit (HOSPITAL_COMMUNITY): Payer: Self-pay

## 2019-05-07 ENCOUNTER — Telehealth: Payer: Self-pay

## 2019-05-08 ENCOUNTER — Telehealth: Payer: Self-pay

## 2019-05-08 NOTE — Telephone Encounter (Signed)
Patient with multiple questions, I have had multiple interactions with her and explained to her that she is not going for surgery but we will plan to do TEE.  I spoke with her nephew today, Annie Main and explained to him that patient has been very confused and she has not been scheduled for any surgery but it was only a TEE and I explained why there was a need for TEE before deciding upon the type of surgery she may need.  I also advised him that in view of patient's confusional state, after discussions with him and also looking through the email messages, patient has been very confused and has significant forgetfulness and also probably started to have dementia.  In view of this after discussions we decided to pursue medical therapy only without any procedures.  Mr. Wendall Mola was very complementary of our practice and he understood the confusion where it came from and he also agreed that his and has been very confused lately.  This was a 25 minutes of telephone encounter with Richardson Landry.

## 2019-05-09 ENCOUNTER — Other Ambulatory Visit: Payer: Self-pay

## 2019-05-09 ENCOUNTER — Encounter (HOSPITAL_COMMUNITY): Payer: Self-pay

## 2019-05-09 ENCOUNTER — Ambulatory Visit (HOSPITAL_COMMUNITY): Admit: 2019-05-09 | Payer: Medicare PPO | Admitting: Cardiology

## 2019-05-09 SURGERY — ECHOCARDIOGRAM, TRANSESOPHAGEAL
Anesthesia: Moderate Sedation

## 2019-05-09 NOTE — Telephone Encounter (Signed)
Complete

## 2019-05-21 ENCOUNTER — Other Ambulatory Visit: Payer: Self-pay | Admitting: Cardiology

## 2019-05-21 ENCOUNTER — Ambulatory Visit: Payer: Medicare PPO | Admitting: Cardiology

## 2019-05-21 DIAGNOSIS — I5031 Acute diastolic (congestive) heart failure: Secondary | ICD-10-CM

## 2019-05-26 ENCOUNTER — Other Ambulatory Visit: Payer: Self-pay | Admitting: Cardiology

## 2019-05-26 DIAGNOSIS — I5031 Acute diastolic (congestive) heart failure: Secondary | ICD-10-CM

## 2019-05-27 ENCOUNTER — Telehealth: Payer: Self-pay

## 2019-06-30 ENCOUNTER — Ambulatory Visit (INDEPENDENT_AMBULATORY_CARE_PROVIDER_SITE_OTHER): Payer: Medicare HMO | Admitting: Cardiology

## 2019-06-30 ENCOUNTER — Encounter: Payer: Self-pay | Admitting: Cardiology

## 2019-06-30 ENCOUNTER — Other Ambulatory Visit: Payer: Self-pay

## 2019-06-30 VITALS — BP 140/69 | HR 79 | Temp 97.3°F | Resp 18 | Ht 58.5 in | Wt 114.0 lb

## 2019-06-30 DIAGNOSIS — I5032 Chronic diastolic (congestive) heart failure: Secondary | ICD-10-CM

## 2019-06-30 DIAGNOSIS — I340 Nonrheumatic mitral (valve) insufficiency: Secondary | ICD-10-CM | POA: Diagnosis not present

## 2019-06-30 DIAGNOSIS — I10 Essential (primary) hypertension: Secondary | ICD-10-CM | POA: Diagnosis not present

## 2019-06-30 NOTE — Progress Notes (Signed)
Primary Physician/Referring:  Buzzy Han, MD  Patient ID: Candace Ross, female    DOB: Aug 01, 1926, 86 y.o.   MRN: [id number]  Subjective   Chief Complaint  Patient presents with  . Severe mitral reguritation    3 month Follow up  . Heart Murmur    HPI: Candace Ross  is a 86 y.o. female  CAD S/P stenting to proximal and mid circumflex and RCA in July 0000000, acute diastolic CHF on 0000000 due to new moderate to severe MR, hypertension, hyperlipidemia, asymptomatic carotid stenosis presents for 1 month follow up. She is a fairly active, living independently.  She is presently doing well except for mild dyspnea on exertion but more importantly today her main complaint has been severe back pain with radiation of pain all the way to the right leg.  She has not had any worsening dyspnea, leg edema, no PND or orthopnea.   Past Medical History:  Diagnosis Date  . Anemia   . Back pain   . Breast cancer ([place])   . Change in stool habits   . Dribbling urine   . H/O bone density study 2009  . Hearing loss   . Hypertension   . Osteoporosis   . Sinus problem   . SOB (shortness of breath) on exertion    stairs  . Spinal stenosis   . Thyroid disease   . Wears dentures    Past Surgical History:  Procedure Laterality Date  . BARTHOLIN GLAND CYST EXCISION  1969  . BREAST LUMPECTOMY  05/02/77   benign  . CARDIAC CATHETERIZATION N/A 12/18/2014   Procedure: Left Heart Cath and Coronary Angiography;  Surgeon: Adrian Prows, MD;  Location: [place] [facility];  Service: Cardiovascular;  Laterality: N/A;  . CARDIAC CATHETERIZATION N/A 12/18/2014   Procedure: Coronary Stent Intervention;  Surgeon: Adrian Prows, MD;  Location: [place] [facility];  Service: Cardiovascular;  Laterality: N/A;  . HERNIA REPAIR  12/15/99   Social History   Socioeconomic History  . Marital status: Widowed    Spouse name: Not on file  . Number of children: 1  . Years of education: Not on file  . Highest  education level: Not on file  Occupational History  . Not on file  Tobacco Use  . Smoking status: Never Smoker  . Smokeless tobacco: Never Used  Substance and Sexual Activity  . Alcohol use: No  . Drug use: No  . Sexual activity: Not on file  Other Topics Concern  . Not on file  Social History Narrative  . Not on file   Social Determinants of Health   Financial Resource Strain:   . Difficulty of Paying Living Expenses: Not on file  Food Insecurity:   . Worried About Charity fundraiser in the Last Year: Not on file  . Ran Out of Food in the Last Year: Not on file  Transportation Needs:   . Lack of Transportation (Medical): Not on file  . Lack of Transportation (Non-Medical): Not on file  Physical Activity:   . Days of Exercise per Week: Not on file  . Minutes of Exercise per Session: Not on file  Stress:   . Feeling of Stress : Not on file  Social Connections:   . Frequency of Communication with Friends and Family: Not on file  . Frequency of Social Gatherings with Friends and Family: Not on file  . Attends Religious Services: Not on file  . Active Member of Clubs or  Organizations: Not on file  . Attends Archivist Meetings: Not on file  . Marital Status: Not on file  Intimate Partner Violence:   . Fear of Current or Ex-Partner: Not on file  . Emotionally Abused: Not on file  . Physically Abused: Not on file  . Sexually Abused: Not on file   Review of Systems  Cardiovascular: Positive for dyspnea on exertion (stable). Negative for leg swelling and syncope.  Endocrine: Negative for cold intolerance.  Hematologic/Lymphatic: Does not bruise/bleed easily.  Musculoskeletal: Positive for back pain (righ leg sciatica) and joint pain. Negative for joint swelling.  Gastrointestinal: Negative for hematochezia and melena.  Neurological: Negative for headaches and light-headedness.  All other systems reviewed and are negative.  Objective  Blood pressure 140/69,  pulse 79, temperature (!) 97.3 F (36.3 C), temperature source Temporal, resp. rate 18, height 4' 10.5" (1.486 m), weight 114 lb (51.7 kg), SpO2 95 %. Body mass index is 23.42 kg/m.   Physical Exam  Constitutional: She appears well-developed. No distress.  HENT:  Head: Atraumatic.  Eyes: Conjunctivae are normal.  Neck: No JVD present. No thyromegaly present.  Cardiovascular: Normal rate, regular rhythm, S1 normal and S2 normal. Exam reveals no gallop.  Murmur heard. High-pitched blowing holosystolic murmur is present with a grade of 3/6 at the apex.  Low-pitched early systolic murmur of grade 2/6 is also present at the upper right sternal border radiating to the neck. There is faint bilateral carotid artery bruit.  Distal pulses were normal.  No leg edema.  Pulmonary/Chest: Effort normal. No respiratory distress. She has no wheezes. She has no rales.  Abdominal: Soft. Bowel sounds are normal.  Musculoskeletal:     Cervical back: Neck supple.  Psychiatric: She has a normal mood and affect.   Radiology: No results found.  Laboratory examination:   CMP Latest Ref Rng & Units 04/11/2019 12/01/2018 11/30/2018  Glucose 65 - 99 mg/dL 97 103(H) 118(H)  BUN 10 - 36 mg/dL 15 14 11   Creatinine 0.57 - 1.00 mg/dL 1.01(H) 1.13(H) 0.90  Sodium 134 - 144 mmol/L 134 132(L) 134(L)  Potassium 3.5 - 5.2 mmol/L 4.0 4.1 3.7  Chloride 96 - 106 mmol/L 96 97(L) 102  CO2 20 - 29 mmol/L 24 25 21(L)  Calcium 8.7 - 10.3 mg/dL 9.6 8.5(L) 8.8(L)  Total Protein 6.0 - 8.5 g/dL 7.0 - -  Total Bilirubin 0.0 - 1.2 mg/dL 0.6 - -  Alkaline Phos 39 - 117 IU/L 89 - -  AST 0 - 40 IU/L 17 - -  ALT 0 - 32 IU/L 9 - -   CBC Latest Ref Rng & Units 04/11/2019 11/29/2018 11/29/2018  WBC 3.4 - 10.8 x10E3/uL 7.1 10.0 7.5  Hemoglobin 11.1 - 15.9 g/dL 12.2 12.7 11.4(L)  Hematocrit 34.0 - 46.6 % 37.4 39.0 35.0(L)  Platelets 150 - 450 x10E3/uL 264 267 248   Lipid Panel     Component Value Date/Time   CHOL 201 (H) 01/22/2016  0447   TRIG 131 01/22/2016 0447   HDL 59 01/22/2016 0447   CHOLHDL 3.4 01/22/2016 0447   VLDL 26 01/22/2016 0447   LDLCALC 116 (H) 01/22/2016 0447   HEMOGLOBIN A1C No results found for: HGBA1C, MPG TSH Recent Labs    11/29/18 1621 04/11/19 1415  TSH 22.297* 3.160    Medications   Current Outpatient Medications  Medication Instructions  . acetaminophen (TYLENOL) 500 mg, Oral, Every 6 hours PRN  . aspirin 81 mg, Oral, Daily after breakfast  .  atorvastatin (LIPITOR) 20 MG tablet TAKE 1 TABLET EVERY DAY  . Cholecalciferol (VITAMIN D3 PO) 1 tablet, Oral, Daily  . Cyanocobalamin (VITAMIN B-12 PO) 1 tablet, Oral, Daily  . dorzolamide (TRUSOPT) 2 % ophthalmic solution 2 drops, Both Eyes, 2 times daily  . Ferrous Sulfate (IRON PO) 1 tablet, Oral, Daily  . furosemide (LASIX) 20 mg, Oral, 2 times daily  . isosorbide mononitrate (IMDUR) 60 mg, Oral, Daily  . latanoprost (XALATAN) 0.005 % ophthalmic solution 2 drops, Both Eyes, Daily at bedtime  . levothyroxine (SYNTHROID) 100 mcg, Oral, Daily before breakfast  . losartan (COZAAR) 50 mg, Oral, Daily at bedtime  . MAGNESIUM PO 1 tablet, Oral, Daily  . metoprolol succinate (TOPROL-XL) 25 mg, Oral, Daily  . Multiple Vitamins-Minerals (PRESERVISION AREDS PO) 1 capsule, Oral, Daily after breakfast  . nitroGLYCERIN (NITROSTAT) 0.4 mg, Sublingual, Every 5 min PRN  . potassium chloride (KLOR-CON) 8 MEQ tablet 8 mEq, Oral, 2 times daily, With furosemide  . VITAMIN E PO 1 capsule, Oral, Daily    Cardiac Studies:   Coronary angiogram 12/18/2014: Stenting of proximal and mid circumflex with a 3.0 x 38 mm resolute integrity DES, 90% stenosis, stenting of the mid RCA with a 2.75 x 26 mm resolute integrity DES, 99% stenosis, both reduced to 0%. LVEF 45% with mid to distal anterolateral akinesis, inferolateral mild hypokinesis.  Nuclear stress test 01/23/2016: Large fixed defect involving the mid and distal anterior wall, anterior septum, and anterior  lateral wall with hypokinesis. No reversibility identified. LVEF 60%.  Carotid artery duplex 11/13/2016: Stenosis in the right internal carotid artery (50-69%), lower end of spectrum. Stenosis in the right external carotid artery (<50%). Stenosis in the left internal carotid artery (16-49%). Antegrade right vertebral artery flow. Antegrade left vertebral artery flow. No change from 05/25/2016. Follow up in six months is appropriate if clinically indicated.  Echocardiogram 11/30/2018 :   1. The left ventricle has normal systolic function with an ejection fraction of 60-65%. The cavity size was normal. There is mild concentric left ventricular hypertrophy. Left ventricular diastolic Doppler parameters are consistent with pseudonormalization. Elevated left atrial and left ventricular end-diastolic pressures. Basal septal hypertrophy. No LVOT obstruction. Presence of mitral apparatus calcification makes diastololgy not accurate. 2 Left atrial size was moderately dilated. 3. Mild mitral subvalvular calcification. Moderate mitral valve prolapse. The mitral valve is degenerative. Moderate thickening of the mitral valve leaflet. Moderate calcification of the mitral valve leaflet. Mitral valve regurgitation is moderate to severe by color flow Doppler. The MR jet is eccentric anteriorly directed. 4. Tricuspid valve regurgitation is moderate.  Pulmonary hypertension is moderate. PASP 43 mm Hg.  5. The aortic valve is tricuspid. Mild thickening of the aortic valve. Mild calcification of the aortic valve. Aortic valve regurgitation is trivial by color flow Doppler. There is evidence of mild plaque in the aortic root.  Assessment     ICD-10-CM   1. Severe mitral regurgitation  I34.0 DNR (Do Not Resuscitate)  2. Chronic diastolic (congestive) heart failure (HCC)  I50.32 DNR (Do Not Resuscitate)  3. Primary hypertension  I10     EKG 11/29/2018:Normal sinus rhythm at rate of 72 bpm, left bundle branch  block. No significant change from  EKG 01/02/2018, 11/22/2016.  Recommendations:   Candace Ross  is a 86 y.o. female  CAD S/P stenting to proximal and mid circumflex and RCA in July 0000000, acute diastolic CHF on 0000000 due to new moderate to severe MR, hypertension, hyperlipidemia, asymptomatic carotid stenosis presents for  3 month follow up. She is a fairly active, living independent.   She is here on a 33-month office visit, fortunately she remains well compensated without clinical evidence of heart failure.  Blood pressure is also relatively well controlled and I do not want to make any changes as she is compensating well with the blood pressure that she has.  I had multiple and extensive discussion with the patient's family, it was decided that we do medical therapy only, no invasive approach is planned.  Again discussed with the patient regarding DNR status and she confirms that she did not want to be resuscitated as well.  I will see her back in 3 months, she will call us if there is any new onset of dyspnea or leg edema.  Reviewed her labs, TSH is normal and also reduction is stable.  She is having severe pain in her back and also right leg from prior sciatica, advised her to follow-up with orthopedics.  Adrian Prows, MD, [facility] 06/30/2019, 1:29 PM [facility]

## 2019-07-15 ENCOUNTER — Ambulatory Visit: Payer: Medicare PPO | Admitting: Physical Medicine and Rehabilitation

## 2019-07-30 ENCOUNTER — Telehealth: Payer: Self-pay | Admitting: Physical Medicine and Rehabilitation

## 2019-07-30 ENCOUNTER — Other Ambulatory Visit: Payer: Self-pay

## 2019-07-30 ENCOUNTER — Ambulatory Visit (INDEPENDENT_AMBULATORY_CARE_PROVIDER_SITE_OTHER): Payer: Medicare HMO | Admitting: Physical Medicine and Rehabilitation

## 2019-07-30 ENCOUNTER — Encounter: Payer: Self-pay | Admitting: Physical Medicine and Rehabilitation

## 2019-07-30 VITALS — BP 111/56 | HR 70 | Ht <= 58 in | Wt 113.0 lb

## 2019-07-30 DIAGNOSIS — M48061 Spinal stenosis, lumbar region without neurogenic claudication: Secondary | ICD-10-CM

## 2019-07-30 DIAGNOSIS — M419 Scoliosis, unspecified: Secondary | ICD-10-CM | POA: Diagnosis not present

## 2019-07-30 DIAGNOSIS — M5416 Radiculopathy, lumbar region: Secondary | ICD-10-CM

## 2019-07-30 NOTE — Progress Notes (Signed)
 .  Numeric Pain Rating Scale and Functional Assessment Average Pain 8 Pain Right Now 7 My pain is intermittent, sharp, dull and aching Pain is worse with: walking, bending, sitting, standing and some activites Pain improves with: medication   In the last MONTH (on 0-10 scale) has pain interfered with the following?  1. General activity like being  able to carry out your everyday physical activities such as walking, climbing stairs, carrying groceries, or moving a chair?  Rating(8)  2. Relation with others like being able to carry out your usual social activities and roles such as  activities at home, at work and in your community. Rating(8)  3. Enjoyment of life such that you have  been bothered by emotional problems such as feeling anxious, depressed or irritable?  Rating(7)

## 2019-07-30 NOTE — Progress Notes (Signed)
Candace Ross - 86 y.o. female MRN [id number]  Date of birth: 1926/10/07  Office Visit Note: Visit Date: 07/30/2019 PCP: Buzzy Han, MD Referred by: Buzzy Han*  Subjective: Chief Complaint  Patient presents with  . Lower Back - Pain  . Right Leg - Pain   HPI: Candace Ross is a 86 y.o. female who comes in today For evaluation and management of chronic severe back and right hip and leg pain with more recent exacerbation over the last several months.  Her pain is quite severe and limiting.  She reports 8 out of 10 intermittent sharp pain with dull and aching toothache pain as well.  She says it is right severe at times.  She reports standing and ambulating and movement seem to make the symptoms worse.  She is using Tylenol Extra Strength with only some mild relief.  She has not had any new trauma since have last seen her.  Last time I saw her was in October 2018.  We had a string of transforaminal type injections that just were not beneficial.  Her spine history is such that she has CT scan done a few years ago showing thoracolumbar scoliosis with rightward curvature particular lower lumbar region.  She has foraminal narrowing at L4 and L5 in particular.  At the time she was on anticoagulation for cardiac issues status post cardiac catheterization.  She is no longer on Plavix at this point.  She does take a small aspirin.  Foraminal injections were just not very beneficial for her and she had a very tight foraminal narrowing.  She denies any left-sided complaints.  She has had no focal weakness but is felt weak.  She has multiple medical issues and is 86 years old.  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  She also has been diagnosed with mitral valve issues and is on a diuretic at this point.  She has not had recent MRI of the lumbar spine but refuses MRI exams at this point but if she had leg pain  and had a fall after 1 MRI episode and she will just not have another 1 of those.  She has not noted focal weakness but feels weak in general.  Review of Systems  Musculoskeletal: Positive for back pain.       Regular right leg pain  All other systems reviewed and are negative.  Otherwise per HPI.  Assessment & Plan: Visit Diagnoses:  1. Lumbar radiculopathy   2. Scoliosis of thoracolumbar spine, unspecified scoliosis type   3. Foraminal stenosis of lumbar region     Plan: Findings:  Chronic severe back pain with lumbar scoliosis and foraminal stenosis and radiculopathy with exacerbation over the last several months to significantly worsening right radicular leg pain more of an L5 L4 distribution.  Prior injections may be were beneficial from a transforaminal approach but just not as fast as she wanted.  She has noted increased pain more over the last several months with no inciting reason to have this.  I did talk to her at length today about her condition and I do think it would be wise to try an interlaminar epidural injection.  We really were not able to do 1 of these at the time do to her anticoagulation status.  She is off anticoagulation now.  She does want to proceed with the injection.  We have tried medication changes in the past and she is  does not tolerate those at all.  She really is at a point where there really is not much in the way of helping her so I hope the injection is approved and we can complete diagnostic interlaminar epidural injection at L5-S1.    Meds & Orders: No orders of the defined types were placed in this encounter.  No orders of the defined types were placed in this encounter.   Follow-up: Return for Right L5/S1 interlaminar epidural steroid injection.   Procedures: No procedures performed  No notes on file   Clinical History: CT scan lumbar spine  IMPRESSION: 1. Severe scoliotic deformity of the thoracolumbar spine. 2. Degenerative changes as  detailed above. Combination of disc bulges and degenerative facet hypertrophy, with accentuation by the scoliotic configuration, are causing moderate to severe neural foramen stenoses at the L1-2 through L4-5 levels with probable associated nerve root impingements. This is a likely source for patient's radiculopathic symptoms. This could be better characterized with lumbar spine MRI. 3. No acute findings.   Electronically Signed By: Franki Cabot M.D. On: 10/14/2015 17:31   She reports that she has never smoked. She has never used smokeless tobacco. No results for input(s): HGBA1C, LABURIC in the last 8760 hours.  Objective:  VS:  HT:4' 9.5" (146.1 cm)   WT:113 lb (51.3 kg)  BMI:24.01    BP:(!) 111/56  HR:70bpm  TEMP: ( )  RESP:  Physical Exam Vitals and nursing note reviewed.  Constitutional:      General: She is not in acute distress.    Appearance: Normal appearance. She is well-developed. She is not ill-appearing.  HENT:     Head: Normocephalic and atraumatic.     Ears:     Comments: Left hearing aid Eyes:     Conjunctiva/sclera: Conjunctivae normal.     Pupils: Pupils are equal, round, and reactive to light.  Cardiovascular:     Rate and Rhythm: Normal rate.     Pulses: Normal pulses.  Pulmonary:     Effort: Pulmonary effort is normal.  Musculoskeletal:     Right lower leg: No edema.     Left lower leg: No edema.     Comments: Is slurred.  Arise from sitting to full extension she does stand with forward flexed lumbar spine obvious rightward scoliosis.  She ambulates with the aid of a walker.  She has some antalgic gait to the right.  Appears to have some balance difficulty.  Skin:    General: Skin is warm and dry.     Findings: No erythema or rash.  Neurological:     General: No focal deficit present.     Mental Status: She is alert and oriented to person, place, and time.     Sensory: No sensory deficit.     Motor: No abnormal muscle tone.     Coordination:  Coordination normal.     Gait: Gait abnormal.  Psychiatric:        Mood and Affect: Mood normal.        Behavior: Behavior normal.     Ortho Exam Imaging: No results found.  Past Medical/Family/Surgical/Social History: Medications & Allergies reviewed per EMR, new medications updated. Patient Active Problem List   Diagnosis Date Noted  . Acute on chronic diastolic heart failure ([place]) 11/29/2018  . Essential hypertension 11/29/2018  . Shortness of breath 11/29/2018  . Acute on chronic diastolic CHF (congestive heart failure) ([place]) 11/29/2018  . Spinal stenosis of lumbar region with neurogenic claudication 01/23/2017  .  Scoliosis of thoracolumbar spine 01/23/2017  . Lumbar radiculopathy 01/23/2017  . CAD (coronary artery disease), native coronary artery 12/18/2014  . Angina pectoris ([place]) 12/17/2014  . Cancer of central portion of female breast ([place]) 10/20/2011   Past Medical History:  Diagnosis Date  . Anemia   . Back pain   . Breast cancer ([place])   . Change in stool habits   . Dribbling urine   . H/O bone density study 2009  . Hearing loss   . Hypertension   . Osteoporosis   . Sinus problem   . SOB (shortness of breath) on exertion    stairs  . Spinal stenosis   . Thyroid disease   . Wears dentures    Family History  Problem Relation Age of Onset  . Pneumonia Father   . Colon cancer Brother    Past Surgical History:  Procedure Laterality Date  . BARTHOLIN GLAND CYST EXCISION  1969  . BREAST LUMPECTOMY  05/02/77   benign  . CARDIAC CATHETERIZATION N/A 12/18/2014   Procedure: Left Heart Cath and Coronary Angiography;  Surgeon: Adrian Prows, MD;  Location: [place] [facility];  Service: Cardiovascular;  Laterality: N/A;  . CARDIAC CATHETERIZATION N/A 12/18/2014   Procedure: Coronary Stent Intervention;  Surgeon: Adrian Prows, MD;  Location: [place] [facility];  Service: Cardiovascular;  Laterality: N/A;  . HERNIA REPAIR  12/15/99   Social History   Occupational History    . Not on file  Tobacco Use  . Smoking status: Never Smoker  . Smokeless tobacco: Never Used  Substance and Sexual Activity  . Alcohol use: No  . Drug use: No  . Sexual activity: Not on file

## 2019-07-30 NOTE — Telephone Encounter (Signed)
Pending: In RN review Tracking [id number]

## 2019-07-30 NOTE — Telephone Encounter (Signed)
Was this supposed to go to you?

## 2019-08-04 NOTE — Telephone Encounter (Signed)
Authorization [id number] (Tracking [id number]) Effective dates: 07/30/19-08/29/19

## 2019-08-06 ENCOUNTER — Other Ambulatory Visit: Payer: Self-pay | Admitting: Cardiology

## 2019-08-12 ENCOUNTER — Encounter: Payer: Self-pay | Admitting: Physical Medicine and Rehabilitation

## 2019-08-12 ENCOUNTER — Ambulatory Visit: Payer: Medicare HMO | Admitting: Physical Medicine and Rehabilitation

## 2019-08-12 ENCOUNTER — Other Ambulatory Visit: Payer: Self-pay

## 2019-08-12 ENCOUNTER — Ambulatory Visit: Payer: Self-pay

## 2019-08-12 VITALS — BP 123/65 | HR 73

## 2019-08-12 DIAGNOSIS — M5416 Radiculopathy, lumbar region: Secondary | ICD-10-CM | POA: Diagnosis not present

## 2019-08-12 DIAGNOSIS — M419 Scoliosis, unspecified: Secondary | ICD-10-CM

## 2019-08-12 DIAGNOSIS — M48061 Spinal stenosis, lumbar region without neurogenic claudication: Secondary | ICD-10-CM | POA: Diagnosis not present

## 2019-08-12 MED ORDER — METHYLPREDNISOLONE ACETATE 80 MG/ML IJ SUSP
80.0000 mg | Freq: Once | INTRAMUSCULAR | Status: AC
  Administered 2019-08-12: 14:00:00 80 mg

## 2019-08-12 NOTE — Progress Notes (Signed)
.  Numeric Pain Rating Scale and Functional Assessment Average Pain 7   In the last MONTH (on 0-10 scale) has pain interfered with the following?  1. General activity like being  able to carry out your everyday physical activities such as walking, climbing stairs, carrying groceries, or moving a chair?  Rating(7)   +Driver, -BT, -Dye Allergies.   

## 2019-08-14 NOTE — Procedures (Signed)
Lumbar Epidural Steroid Injection - Interlaminar Approach with Fluoroscopic Guidance  Patient: Candace Ross      Date of Birth: [birth year] MRN: [id number] PCP: Buzzy Han, MD      Visit Date: 08/12/2019   Universal Protocol:     Consent Given By: the patient  Position: PRONE  Additional Comments: Vital signs were monitored before and after the procedure. Patient was prepped and draped in the usual sterile fashion. The correct patient, procedure, and site was verified.   Injection Procedure Details:  Procedure Site One Meds Administered:  Meds ordered this encounter  Medications  . methylPREDNISolone acetate (DEPO-MEDROL) injection 80 mg     Laterality: Right  Location/Site:  L5-S1  Needle size: 20 G  Needle type: Tuohy  Needle Placement: Paramedian epidural  Findings:   -Comments: Excellent flow of contrast into the epidural space.  Procedure Details: Using a paramedian approach from the side mentioned above, the region overlying the inferior lamina was localized under fluoroscopic visualization and the soft tissues overlying this structure were infiltrated with 4 ml. of 1% Lidocaine without Epinephrine. The Tuohy needle was inserted into the epidural space using a paramedian approach.   The epidural space was localized using loss of resistance along with lateral and bi-planar fluoroscopic views.  After negative aspirate for air, blood, and CSF, a 2 ml. volume of Isovue-250 was injected into the epidural space and the flow of contrast was observed. Radiographs were obtained for documentation purposes.    The injectate was administered into the level noted above.   Additional Comments:  The patient tolerated the procedure well Dressing: 2 x 2 sterile gauze and Band-Aid    Post-procedure details: Patient was observed during the procedure. Post-procedure instructions were reviewed.  Patient left the clinic in stable condition.

## 2019-08-14 NOTE — Progress Notes (Signed)
Candace Ross - 86 y.o. female MRN [id number]  Date of birth: 10-Mar-1927  Office Visit Note: Visit Date: 08/12/2019 PCP: Buzzy Han, MD Referred by: Buzzy Han*  Subjective: Chief Complaint  Patient presents with  . Lower Back - Pain  . Right Leg - Pain  . Left Leg - Pain   HPI: Candace Ross is a 86 y.o. female who comes in today For planned right L5-S1 interlaminar epidural steroid injection.  The patient has failed conservative care including home exercise, medications, time and activity modification.  This injection will be diagnostic and hopefully therapeutic.  Please see requesting physician notes for further details and justification.   ROS Otherwise per HPI.  Assessment & Plan: Visit Diagnoses:  1. Lumbar radiculopathy   2. Foraminal stenosis of lumbar region   3. Scoliosis of thoracolumbar spine, unspecified scoliosis type     Plan: No additional findings.   Meds & Orders:  Meds ordered this encounter  Medications  . methylPREDNISolone acetate (DEPO-MEDROL) injection 80 mg    Orders Placed This Encounter  Procedures  . XR C-ARM NO REPORT  . Epidural Steroid injection    Follow-up: Return if symptoms worsen or fail to improve.   Procedures: No procedures performed  Lumbar Epidural Steroid Injection - Interlaminar Approach with Fluoroscopic Guidance  Patient: Candace Ross      Date of Birth: 1926/10/29 MRN: [id number] PCP: Buzzy Han, MD      Visit Date: 08/12/2019   Universal Protocol:     Consent Given By: the patient  Position: PRONE  Additional Comments: Vital signs were monitored before and after the procedure. Patient was prepped and draped in the usual sterile fashion. The correct patient, procedure, and site was verified.   Injection Procedure Details:  Procedure Site One Meds Administered:  Meds ordered this encounter  Medications  . methylPREDNISolone acetate (DEPO-MEDROL) injection  80 mg     Laterality: Right  Location/Site:  L5-S1  Needle size: 20 G  Needle type: Tuohy  Needle Placement: Paramedian epidural  Findings:   -Comments: Excellent flow of contrast into the epidural space.  Procedure Details: Using a paramedian approach from the side mentioned above, the region overlying the inferior lamina was localized under fluoroscopic visualization and the soft tissues overlying this structure were infiltrated with 4 ml. of 1% Lidocaine without Epinephrine. The Tuohy needle was inserted into the epidural space using a paramedian approach.   The epidural space was localized using loss of resistance along with lateral and bi-planar fluoroscopic views.  After negative aspirate for air, blood, and CSF, a 2 ml. volume of Isovue-250 was injected into the epidural space and the flow of contrast was observed. Radiographs were obtained for documentation purposes.    The injectate was administered into the level noted above.   Additional Comments:  The patient tolerated the procedure well Dressing: 2 x 2 sterile gauze and Band-Aid    Post-procedure details: Patient was observed during the procedure. Post-procedure instructions were reviewed.  Patient left the clinic in stable condition.    Clinical History: CT scan lumbar spine  IMPRESSION: 1. Severe scoliotic deformity of the thoracolumbar spine. 2. Degenerative changes as detailed above. Combination of disc bulges and degenerative facet hypertrophy, with accentuation by the scoliotic configuration, are causing moderate to severe neural foramen stenoses at the L1-2 through L4-5 levels with probable associated nerve root impingements. This is a likely source for patient's radiculopathic symptoms. This could be better characterized with lumbar spine MRI. 3.  No acute findings.   Electronically Signed By: Franki Cabot M.D. On: 10/14/2015 17:31   She reports that she has never smoked. She has never used  smokeless tobacco. No results for input(s): HGBA1C, LABURIC in the last 8760 hours.  Objective:  VS:  HT:    WT:   BMI:     BP:123/65  HR:73bpm  TEMP: ( )  RESP:  Physical Exam  Ortho Exam Imaging: No results found.  Past Medical/Family/Surgical/Social History: Medications & Allergies reviewed per EMR, new medications updated. Patient Active Problem List   Diagnosis Date Noted  . Acute on chronic diastolic heart failure ([place]) 11/29/2018  . Essential hypertension 11/29/2018  . Shortness of breath 11/29/2018  . Acute on chronic diastolic CHF (congestive heart failure) ([place]) 11/29/2018  . Spinal stenosis of lumbar region with neurogenic claudication 01/23/2017  . Scoliosis of thoracolumbar spine 01/23/2017  . Lumbar radiculopathy 01/23/2017  . CAD (coronary artery disease), native coronary artery 12/18/2014  . Angina pectoris ([place]) 12/17/2014  . Cancer of central portion of female breast ([place]) 10/20/2011   Past Medical History:  Diagnosis Date  . Anemia   . Back pain   . Breast cancer ([place])   . Change in stool habits   . Dribbling urine   . H/O bone density study 2009  . Hearing loss   . Hypertension   . Osteoporosis   . Sinus problem   . SOB (shortness of breath) on exertion    stairs  . Spinal stenosis   . Thyroid disease   . Wears dentures    Family History  Problem Relation Age of Onset  . Pneumonia Father   . Colon cancer Brother    Past Surgical History:  Procedure Laterality Date  . BARTHOLIN GLAND CYST EXCISION  1969  . BREAST LUMPECTOMY  05/02/77   benign  . CARDIAC CATHETERIZATION N/A 12/18/2014   Procedure: Left Heart Cath and Coronary Angiography;  Surgeon: Adrian Prows, MD;  Location: [facility];  Service: Cardiovascular;  Laterality: N/A;  . CARDIAC CATHETERIZATION N/A 12/18/2014   Procedure: Coronary Stent Intervention;  Surgeon: Adrian Prows, MD;  Location: [facility];  Service: Cardiovascular;  Laterality: N/A;  . HERNIA REPAIR   12/15/99   Social History   Occupational History  . Not on file  Tobacco Use  . Smoking status: Never Smoker  . Smokeless tobacco: Never Used  Substance and Sexual Activity  . Alcohol use: No  . Drug use: No  . Sexual activity: Not on file

## 2019-09-10 ENCOUNTER — Other Ambulatory Visit: Payer: Self-pay | Admitting: Cardiology

## 2019-09-10 DIAGNOSIS — I25118 Atherosclerotic heart disease of native coronary artery with other forms of angina pectoris: Secondary | ICD-10-CM

## 2019-09-26 NOTE — Progress Notes (Deleted)
Primary Physician/Referring:  Buzzy Han, MD  Patient ID: [name], female    DOB: 01/06/1927, 86 y.o.   MRN: [id number]  No chief complaint on file.  HPI:    Candace Ross  is a 86 y.o. Caucasian female CAD S/P stenting to proximal and mid circumflex and RCA in July 0000000, acute diastolic CHF on 0000000 due to new moderate to severe MR, hypertension, hyperlipidemia, asymptomatic carotid stenosis presents for 1 month follow up. She is a fairly active, living independently. *** She is presently doing well except for mild dyspnea on exertion but more importantly today her main complaint has been severe back pain with radiation of pain all the way to the right leg.  She has not had any worsening dyspnea, leg edema, no PND or orthopnea.   Past Medical History:  Diagnosis Date  . Anemia   . Back pain   . Breast cancer ([place])   . Change in stool habits   . Dribbling urine   . H/O bone density study 2009  . Hearing loss   . Hypertension   . Osteoporosis   . Sinus problem   . SOB (shortness of breath) on exertion    stairs  . Spinal stenosis   . Thyroid disease   . Wears dentures    Past Surgical History:  Procedure Laterality Date  . BARTHOLIN GLAND CYST EXCISION  1969  . BREAST LUMPECTOMY  05/02/77   benign  . CARDIAC CATHETERIZATION N/A 12/18/2014   Procedure: Left Heart Cath and Coronary Angiography;  Surgeon: Adrian Prows, MD;  Location: [facility];  Service: Cardiovascular;  Laterality: N/A;  . CARDIAC CATHETERIZATION N/A 12/18/2014   Procedure: Coronary Stent Intervention;  Surgeon: Adrian Prows, MD;  Location: [facility];  Service: Cardiovascular;  Laterality: N/A;  . HERNIA REPAIR  12/15/99   Family History  Problem Relation Age of Onset  . Pneumonia Father   . Colon cancer Brother     Social History   Tobacco Use  . Smoking status: Never Smoker  . Smokeless tobacco: Never Used  Substance Use Topics  . Alcohol use: No   Marital  Status: Widowed  ROS  ***Review of Systems  Constitution: Negative for chills, decreased appetite, malaise/fatigue and weight gain.  Cardiovascular: Positive for dyspnea on exertion. Negative for leg swelling and syncope.  Endocrine: Negative for cold intolerance.  Hematologic/Lymphatic: Does not bruise/bleed easily.  Musculoskeletal: Positive for back pain (right leg sciatica) and joint pain. Negative for joint swelling.  Gastrointestinal: Negative for abdominal pain, anorexia, change in bowel habit, hematochezia and melena.  Neurological: Negative for headaches and light-headedness.  Psychiatric/Behavioral: Negative for depression and substance abuse.   Objective  There were no vitals taken for this visit.  Vitals with BMI 08/12/2019 07/30/2019 06/30/2019  Height - 4' 9.5" 4' 10.5"  Weight - 113 lbs 114 lbs  BMI - [id number]  Systolic [id number]  Diastolic 65 56 69  Pulse 73 70 79     ***Physical Exam  Constitutional: She appears well-developed and well-nourished.  HENT:  Head: Atraumatic.  Eyes: Conjunctivae are normal.  Neck: No JVD present. No thyromegaly present.  Cardiovascular: Normal rate, regular rhythm and intact distal pulses. Exam reveals no gallop.  Murmur heard.  Holosystolic murmur is present with a grade of 3/6 at the apex.  Low-pitched early systolic murmur of grade 2/6 is also present at the upper right sternal border radiating to the neck. There is faint  bilateral carotid artery bruit.  Distal pulses were normal.  No leg edema.  Pulmonary/Chest: Effort normal and breath sounds normal. No accessory muscle usage. No respiratory distress.  Abdominal: Soft. Bowel sounds are normal.  Musculoskeletal:        General: Normal range of motion.     Cervical back: Neck supple.  Neurological: She is alert.  Skin: Skin is warm and dry.  Psychiatric: She has a normal mood and affect.   Laboratory examination:   Recent Labs    11/30/18 0400 12/01/18 0414 04/11/19 1415   NA 134* 132* 134  K 3.7 4.1 4.0  CL 102 97* 96  CO2 21* 25 24  GLUCOSE 118* 103* 97  BUN 11 14 15   CREATININE 0.90 1.13* 1.01*  CALCIUM 8.8* 8.5* 9.6  GFRNONAA 57* 43* 49*  GFRAA >60 50* 57*   CrCl cannot be calculated (Patient's most recent lab result is older than the maximum 21 days allowed.).  CMP Latest Ref Rng & Units 04/11/2019 12/01/2018 11/30/2018  Glucose 65 - 99 mg/dL 97 103(H) 118(H)  BUN 10 - 36 mg/dL 15 14 11   Creatinine 0.57 - 1.00 mg/dL 1.01(H) 1.13(H) 0.90  Sodium 134 - 144 mmol/L 134 132(L) 134(L)  Potassium 3.5 - 5.2 mmol/L 4.0 4.1 3.7  Chloride 96 - 106 mmol/L 96 97(L) 102  CO2 20 - 29 mmol/L 24 25 21(L)  Calcium 8.7 - 10.3 mg/dL 9.6 8.5(L) 8.8(L)  Total Protein 6.0 - 8.5 g/dL 7.0 - -  Total Bilirubin 0.0 - 1.2 mg/dL 0.6 - -  Alkaline Phos 39 - 117 IU/L 89 - -  AST 0 - 40 IU/L 17 - -  ALT 0 - 32 IU/L 9 - -   CBC Latest Ref Rng & Units 04/11/2019 11/29/2018 11/29/2018  WBC 3.4 - 10.8 x10E3/uL 7.1 10.0 7.5  Hemoglobin 11.1 - 15.9 g/dL 12.2 12.7 11.4(L)  Hematocrit 34.0 - 46.6 % 37.4 39.0 35.0(L)  Platelets 150 - 450 x10E3/uL 264 267 248   Lipid Panel     Component Value Date/Time   CHOL 201 (H) 01/22/2016 0447   TRIG 131 01/22/2016 0447   HDL 59 01/22/2016 0447   CHOLHDL 3.4 01/22/2016 0447   VLDL 26 01/22/2016 0447   LDLCALC 116 (H) 01/22/2016 0447   HEMOGLOBIN A1C No results found for: HGBA1C, MPG TSH Recent Labs    11/29/18 1621 04/11/19 1415  TSH 22.297* 3.160    External labs:   ***  Medications and allergies   Allergies  Allergen Reactions  . Penicillins Other (See Comments)    NOT Allergy to penicillin  Allergic to FILLER ONLY  Non active ingredient  . Other Other (See Comments)    "some medicine they gave me in the hospital, it was a little purple sleeping  pill, to help me sleep didn't work; it made me mean"     Current Outpatient Medications  Medication Instructions  . acetaminophen (TYLENOL) 500 mg, Oral, Every 6 hours PRN    . aspirin 81 mg, Oral, Daily after breakfast  . atorvastatin (LIPITOR) 20 MG tablet TAKE 1 TABLET EVERY DAY  . Cholecalciferol (VITAMIN D3 PO) 1 tablet, Oral, Daily  . Cyanocobalamin (VITAMIN B-12 PO) 1 tablet, Oral, Weekly  . dorzolamide (TRUSOPT) 2 % ophthalmic solution 2 drops, Both Eyes, 2 times daily  . Ferrous Sulfate (IRON PO) 1 tablet, Oral, Weekly  . furosemide (LASIX) 20 mg, Oral, 2 times daily  . isosorbide mononitrate (IMDUR) 60 MG 24 hr tablet TAKE  1 TABLET EVERY DAY  . latanoprost (XALATAN) 0.005 % ophthalmic solution 2 drops, Both Eyes, Daily at bedtime  . levothyroxine (SYNTHROID) 100 mcg, Oral, Daily before breakfast  . losartan (COZAAR) 50 mg, Oral, Daily at bedtime  . MAGNESIUM PO 1 tablet, Oral, Every 1 hour PRN  . metoprolol succinate (TOPROL-XL) 25 mg, Oral, Daily  . nitroGLYCERIN (NITROSTAT) 0.4 mg, Sublingual, Every 5 min PRN  . potassium chloride (KLOR-CON) 8 MEQ tablet 8 mEq, Oral, 2 times daily, With furosemide  . VITAMIN E PO 1 capsule, Oral, Every other day   Radiology:   No results found.  Cardiac Studies:   ***Coronary angiogram 2014/12/24: Stenting of proximal and mid circumflex with a 3.0 x 38 mm resolute integrity DES, 90% stenosis, stenting of the mid RCA with a 2.75 x 26 mm resolute integrity DES, 99% stenosis, both reduced to 0%. LVEF 45% with mid to distal anterolateral akinesis, inferolateral mild hypokinesis.  Nuclear stress test 01/23/2016: Large fixed defect involving the mid and distal anterior wall, anterior septum, and anterior lateral wall with hypokinesis. No reversibility identified. LVEF 60%.  Carotid artery duplex 11/13/2016: Stenosis in the right internal carotid artery (50-69%), lower end of spectrum. Stenosis in the right external carotid artery (<50%). Stenosis in the left internal carotid artery (16-49%). Antegrade right vertebral artery flow. Antegrade left vertebral artery flow. No change from 05/25/2016. Follow up in six months  is appropriate if clinically indicated.  Echocardiogram 11/30/2018 :   1. The left ventricle has normal systolic function with an ejection fraction of 60-65%. The cavity size was normal. There is mild concentric left ventricular hypertrophy. Left ventricular diastolic Doppler parameters are consistent with pseudonormalization. Elevated left atrial and left ventricular end-diastolic pressures. Basal septal hypertrophy. No LVOT obstruction. Presence of mitral apparatus calcification makes diastololgy not accurate. 2 Left atrial size was moderately dilated. 3. Mild mitral subvalvular calcification. Moderate mitral valve prolapse. The mitral valve is degenerative. Moderate thickening of the mitral valve leaflet. Moderate calcification of the mitral valve leaflet. Mitral valve regurgitation is moderate to severe by color flow Doppler. The MR jet is eccentric anteriorly directed. 4. Tricuspid valve regurgitation is moderate.  Pulmonary hypertension is moderate. PASP 43 mm Hg.  5. The aortic valve is tricuspid. Mild thickening of the aortic valve. Mild calcification of the aortic valve. Aortic valve regurgitation is trivial by color flow Doppler. There is evidence of mild plaque in the aortic root.  EKG  ***   11/29/2018:Normal sinus rhythm at rate of 72 bpm, left bundle branch block. No significant change from  EKG 01/02/2018, 11/22/2016.   Assessment     ICD-10-CM   1. Severe mitral regurgitation  I34.0   2. Chronic diastolic (congestive) heart failure (HCC)  I50.32   3. Primary hypertension  I10      No orders of the defined types were placed in this encounter.   There are no discontinued medications.  Recommendations:   ***Candace Ross  is a 86 y.o. female  CAD S/P stenting to proximal and mid circumflex and RCA in July 0000000, acute diastolic CHF on 0000000 due to new moderate to severe MR, hypertension, hyperlipidemia, asymptomatic carotid stenosis presents for 3 month follow up.  She is a fairly active, living independent.   She is here on a 75-month office visit, fortunately she remains well compensated without clinical evidence of heart failure.  Blood pressure is also relatively well controlled and I do not want to make any changes as she is compensating  well with the blood pressure that she has.  I had multiple and extensive discussion with the patient's family, it was decided that we do medical therapy only, no invasive approach is planned.  Again discussed with the patient regarding DNR status and she confirms that she did not want to be resuscitated as well.  I will see her back in 3 months, she will call us if there is any new onset of dyspnea or leg edema.  Reviewed her labs, TSH is normal and also reduction is stable.  She is having severe pain in her back and also right leg from prior sciatica, advised her to follow-up with orthopedics.   Adrian Prows, MD, [facility] 09/29/2019, 11:39 AM [facility] [place] Pager: [phone] Office: [phone]

## 2019-09-29 ENCOUNTER — Ambulatory Visit: Payer: Medicare HMO | Admitting: Cardiology

## 2019-10-06 ENCOUNTER — Ambulatory Visit: Payer: Medicare HMO | Admitting: Cardiology

## 2019-10-06 ENCOUNTER — Other Ambulatory Visit: Payer: Self-pay

## 2019-10-06 ENCOUNTER — Encounter: Payer: Self-pay | Admitting: Cardiology

## 2019-10-06 VITALS — BP 140/65 | HR 58 | Temp 99.0°F | Resp 15 | Ht <= 58 in | Wt 120.0 lb

## 2019-10-06 DIAGNOSIS — I10 Essential (primary) hypertension: Secondary | ICD-10-CM

## 2019-10-06 DIAGNOSIS — Z66 Do not resuscitate: Secondary | ICD-10-CM

## 2019-10-06 DIAGNOSIS — I5032 Chronic diastolic (congestive) heart failure: Secondary | ICD-10-CM

## 2019-10-06 DIAGNOSIS — I340 Nonrheumatic mitral (valve) insufficiency: Secondary | ICD-10-CM

## 2019-10-06 DIAGNOSIS — I447 Left bundle-branch block, unspecified: Secondary | ICD-10-CM

## 2019-10-06 NOTE — Progress Notes (Signed)
Primary Physician/Referring:  Buzzy Han, MD  Patient ID: Candace Ross, female    DOB: 06-18-1926, 86 y.o.   MRN: [id number]  Chief Complaint  Patient presents with  . Congestive Heart Failure  . Mitral Regurgitation  . Follow-up    3 month   HPI:    Candace Ross  is a 86 y.o. Caucasian female CAD S/P stenting to proximal and mid circumflex and RCA in July 0000000, acute diastolic CHF on 0000000 due to new moderate to severe MR, hypertension, hyperlipidemia, asymptomatic carotid stenosis presents for 1 month follow up. She is a fairly active, living independently.  She is presently doing well. She has not had any worsening dyspnea, leg edema, no PND or orthopnea.   Past Medical History:  Diagnosis Date  . Anemia   . Back pain   . Breast cancer ([place])   . Change in stool habits   . Dribbling urine   . H/O bone density study 2009  . Hearing loss   . Hypertension   . Osteoporosis   . Sinus problem   . SOB (shortness of breath) on exertion    stairs  . Spinal stenosis   . Thyroid disease   . Wears dentures    Past Surgical History:  Procedure Laterality Date  . BARTHOLIN GLAND CYST EXCISION  1969  . BREAST LUMPECTOMY  05/02/77   benign  . CARDIAC CATHETERIZATION N/A 12/18/2014   Procedure: Left Heart Cath and Coronary Angiography;  Surgeon: Adrian Prows, MD;  Location: [facility];  Service: Cardiovascular;  Laterality: N/A;  . CARDIAC CATHETERIZATION N/A 12/18/2014   Procedure: Coronary Stent Intervention;  Surgeon: Adrian Prows, MD;  Location: [facility];  Service: Cardiovascular;  Laterality: N/A;  . HERNIA REPAIR  12/15/99   Family History  Problem Relation Age of Onset  . Pneumonia Father   . Colon cancer Brother     Social History   Tobacco Use  . Smoking status: Never Smoker  . Smokeless tobacco: Never Used  Substance Use Topics  . Alcohol use: No   Marital Status: Widowed  ROS  Review of Systems  Constitution: Negative for  chills, decreased appetite, malaise/fatigue and weight gain.  Cardiovascular: Positive for dyspnea on exertion. Negative for leg swelling and syncope.  Endocrine: Negative for cold intolerance.  Hematologic/Lymphatic: Does not bruise/bleed easily.  Musculoskeletal: Positive for back pain (right leg sciatica) and joint pain. Negative for joint swelling.  Gastrointestinal: Negative for abdominal pain, anorexia, change in bowel habit, hematochezia and melena.  Neurological: Negative for headaches and light-headedness.  Psychiatric/Behavioral: Negative for depression and substance abuse.   Objective  Blood pressure 140/65, pulse (!) 58, temperature 99 F (37.2 C), temperature source Temporal, resp. rate 15, height 4\' 9"  (1.448 m), weight 120 lb (54.4 kg), SpO2 98 %.  Vitals with BMI 10/06/2019 08/12/2019 07/30/2019  Height 4\' 9"  - 4' 9.5"  Weight 120 lbs - 113 lbs  BMI 0000000 - [id number]  Systolic [id number]  Diastolic 65 65 56  Pulse 58 73 70     Physical Exam  Constitutional: She appears well-developed and well-nourished.  HENT:  Head: Atraumatic.  Eyes: Conjunctivae are normal.  Neck: No JVD present. No thyromegaly present.  Cardiovascular: Normal rate, regular rhythm and intact distal pulses. Exam reveals no gallop.  Murmur heard.  Holosystolic murmur is present with a grade of 3/6 at the apex.  Low-pitched early systolic murmur of grade 2/6 is also present at the  upper right sternal border radiating to the neck. There is faint bilateral carotid artery bruit.  Distal pulses were normal.  No leg edema.  Pulmonary/Chest: Effort normal and breath sounds normal. No accessory muscle usage. No respiratory distress.  Abdominal: Soft. Bowel sounds are normal.  Musculoskeletal:        General: Normal range of motion.     Cervical back: Neck supple.  Neurological: She is alert.  Skin: Skin is warm and dry.  Psychiatric: She has a normal mood and affect.   Laboratory examination:   Recent Labs      11/30/18 0400 12/01/18 0414 04/11/19 1415  NA 134* 132* 134  K 3.7 4.1 4.0  CL 102 97* 96  CO2 21* 25 24  GLUCOSE 118* 103* 97  BUN 11 14 15   CREATININE 0.90 1.13* 1.01*  CALCIUM 8.8* 8.5* 9.6  GFRNONAA 57* 43* 49*  GFRAA >60 50* 57*   CrCl cannot be calculated (Patient's most recent lab result is older than the maximum 21 days allowed.).  CMP Latest Ref Rng & Units 04/11/2019 12/01/2018 11/30/2018  Glucose 65 - 99 mg/dL 97 103(H) 118(H)  BUN 10 - 36 mg/dL 15 14 11   Creatinine 0.57 - 1.00 mg/dL 1.01(H) 1.13(H) 0.90  Sodium 134 - 144 mmol/L 134 132(L) 134(L)  Potassium 3.5 - 5.2 mmol/L 4.0 4.1 3.7  Chloride 96 - 106 mmol/L 96 97(L) 102  CO2 20 - 29 mmol/L 24 25 21(L)  Calcium 8.7 - 10.3 mg/dL 9.6 8.5(L) 8.8(L)  Total Protein 6.0 - 8.5 g/dL 7.0 - -  Total Bilirubin 0.0 - 1.2 mg/dL 0.6 - -  Alkaline Phos 39 - 117 IU/L 89 - -  AST 0 - 40 IU/L 17 - -  ALT 0 - 32 IU/L 9 - -   CBC Latest Ref Rng & Units 04/11/2019 11/29/2018 11/29/2018  WBC 3.4 - 10.8 x10E3/uL 7.1 10.0 7.5  Hemoglobin 11.1 - 15.9 g/dL 12.2 12.7 11.4(L)  Hematocrit 34.0 - 46.6 % 37.4 39.0 35.0(L)  Platelets 150 - 450 x10E3/uL 264 267 248   Lipid Panel     Component Value Date/Time   CHOL 201 (H) 01/22/2016 0447   TRIG 131 01/22/2016 0447   HDL 59 01/22/2016 0447   CHOLHDL 3.4 01/22/2016 0447   VLDL 26 01/22/2016 0447   LDLCALC 116 (H) 01/22/2016 0447   HEMOGLOBIN A1C No results found for: HGBA1C, MPG TSH Recent Labs    11/29/18 1621 04/11/19 1415  TSH 22.297* 3.160    External labs:   Cholesterol, total 189.000 m 05/23/2018 HDL 58 MG/DL 05/23/2018 LDL 103.000 m 05/23/2018 Triglycerides 139.000 05/23/2018  Hemoglobin 12.200 G/ 04/11/2019 Platelets 264.000 04/11/2019  Creatinine, Serum 1.010 04/11/2019 Potassium 4.000 04/11/2019 Magnesium N/D ALT (SGPT) 9.000 04/11/2019  TSH 3.160 04/11/2019  BNP 155.400 P 09/28/2017   Medications and allergies   Allergies  Allergen Reactions  .  Penicillins Other (See Comments)    NOT Allergy to penicillin  Allergic to FILLER ONLY  Non active ingredient  . Other Other (See Comments)    "some medicine they gave me in the hospital, it was a little purple sleeping  pill, to help me sleep didn't work; it made me mean"     Current Outpatient Medications  Medication Instructions  . acetaminophen (TYLENOL) 500 mg, Oral, Every 6 hours PRN  . aspirin 81 mg, Oral, Daily after breakfast  . atorvastatin (LIPITOR) 20 MG tablet TAKE 1 TABLET EVERY DAY  . Cholecalciferol (VITAMIN D3 PO) 1 tablet,  Oral, Daily  . Cyanocobalamin (VITAMIN B-12 PO) 1 tablet, Oral, Weekly  . dorzolamide (TRUSOPT) 2 % ophthalmic solution 2 drops, Both Eyes, 2 times daily  . Ferrous Sulfate (IRON PO) 1 tablet, Oral, Weekly  . furosemide (LASIX) 20 mg, Oral, 2 times daily  . isosorbide mononitrate (IMDUR) 60 MG 24 hr tablet TAKE 1 TABLET EVERY DAY  . latanoprost (XALATAN) 0.005 % ophthalmic solution 2 drops, Both Eyes, Daily at bedtime  . levothyroxine (SYNTHROID) 100 mcg, Oral, Daily before breakfast  . losartan (COZAAR) 50 mg, Oral, Daily at bedtime  . MAGNESIUM PO 1 tablet, Oral, Every 1 hour PRN  . metoprolol succinate (TOPROL-XL) 25 mg, Oral, Daily  . nitroGLYCERIN (NITROSTAT) 0.4 mg, Sublingual, Every 5 min PRN  . potassium chloride (KLOR-CON) 8 MEQ tablet 8 mEq, Oral, 2 times daily, With furosemide  . VITAMIN E PO 1 capsule, Oral, Every other day   Radiology:   No results found.  Cardiac Studies:   Coronary angiogram 12/18/2014: Stenting of proximal and mid circumflex with a 3.0 x 38 mm resolute integrity DES, 90% stenosis, stenting of the mid RCA with a 2.75 x 26 mm resolute integrity DES, 99% stenosis, both reduced to 0%. LVEF 45% with mid to distal anterolateral akinesis, inferolateral mild hypokinesis.  Nuclear stress test 01/23/2016: Large fixed defect involving the mid and distal anterior wall, anterior septum, and anterior lateral wall with  hypokinesis. No reversibility identified. LVEF 60%.  Carotid artery duplex 11/13/2016: Stenosis in the right internal carotid artery (50-69%), lower end of spectrum. Stenosis in the right external carotid artery (<50%). Stenosis in the left internal carotid artery (16-49%). Antegrade right vertebral artery flow. Antegrade left vertebral artery flow. No change from 05/25/2016. Follow up in six months is appropriate if clinically indicated.  Echocardiogram 11/30/2018 :   1. The left ventricle has normal systolic function with an ejection fraction of 60-65%. The cavity size was normal. There is mild concentric left ventricular hypertrophy. Left ventricular diastolic Doppler parameters are consistent with pseudonormalization. Elevated left atrial and left ventricular end-diastolic pressures. Basal septal hypertrophy. No LVOT obstruction. Presence of mitral apparatus calcification makes diastololgy not accurate. 2 Left atrial size was moderately dilated. 3. Mild mitral subvalvular calcification. Moderate mitral valve prolapse. The mitral valve is degenerative. Moderate thickening of the mitral valve leaflet. Moderate calcification of the mitral valve leaflet. Mitral valve regurgitation is moderate to severe by color flow Doppler. The MR jet is eccentric anteriorly directed. 4. Tricuspid valve regurgitation is moderate.  Pulmonary hypertension is moderate. PASP 43 mm Hg.  5. The aortic valve is tricuspid. Mild thickening of the aortic valve. Mild calcification of the aortic valve. Aortic valve regurgitation is trivial by color flow Doppler. There is evidence of mild plaque in the aortic root.  EKG  EKG 10/06/2019: Normal sinus rhythm at rate of 74 bpm, left atrial enlargement, left bundle branch block.  No further analysis.   No significant change from 11/29/2018.   Assessment     ICD-10-CM   1. Severe mitral regurgitation  I34.0 EKG 12-Lead  2. Chronic diastolic (congestive) heart failure (HCC)   I50.32   3. Primary hypertension  I10   4. LBBB (left bundle branch block)  I44.7   5. DNR (do not resuscitate)  Z66    No orders of the defined types were placed in this encounter.   There are no discontinued medications.  Recommendations:   Candace Ross  is a  86 y.o. female  CAD S/P stenting  to proximal and mid circumflex and RCA in July 0000000, acute diastolic CHF on 0000000 due to new moderate to severe MR, hypertension, hyperlipidemia, asymptomatic carotid stenosis presents for 3 month follow up. She is a fairly active, living independent.   I had multiple and extensive discussion with the patient's family, it was decided that we do medical therapy only, no invasive approach is planned.  Patient also wishes to be DNR.   She is presently well compensated with regard to severe mitral regurgitation.  Blood pressure is also well controlled and she remains asymptomatic, no changes in the medications were done today.  I will see her back in 6 months.  External labs reviewed.  Adrian Prows, MD, [facility] 10/06/2019, 1:45 PM [facility] [place] Pager: [phone] Office: [phone]

## 2019-10-08 ENCOUNTER — Other Ambulatory Visit: Payer: Self-pay | Admitting: Cardiology

## 2019-10-08 DIAGNOSIS — I5031 Acute diastolic (congestive) heart failure: Secondary | ICD-10-CM

## 2019-10-10 ENCOUNTER — Ambulatory Visit: Payer: Medicare HMO | Admitting: Cardiology

## 2019-11-14 ENCOUNTER — Other Ambulatory Visit: Payer: Self-pay | Admitting: Cardiology

## 2019-11-14 DIAGNOSIS — I5031 Acute diastolic (congestive) heart failure: Secondary | ICD-10-CM

## 2019-12-06 ENCOUNTER — Telehealth: Payer: Self-pay | Admitting: Cardiology

## 2019-12-06 NOTE — Telephone Encounter (Signed)
ON-CALL  12/06/19  Patient's name: Candace Ross.   MRN: [id number].    DOB: Apr 11, 1927 Primary care provider: Buzzy Han, MD. Cardiologist: Dr. Adrian Prows  Interaction regarding this patient's care today: Patient had called the on-call physician for guidance in regards to shortness of breath and possible retaining fluid.  However, patient states that she has not noticed any significant weight gain, denies orthopnea, paroxysmal nocturnal dyspnea, and no significant change in lower extremity swelling.  In addition, she has labile blood pressures.  She is recommended to hold off on antihypertensive medications if her systolic blood pressures are less than 100 mmHg and to recheck when she is due for the next dose prior to taking medications.  Recommended that she take an extra dose of Lasix today.  If taking the extra dose of Lasix helps she is asked to continue this tomorrow as well.  She is asked to call the office on Monday to have orders for blood work to check kidney function and electrolytes.  Patient states that she wants to avoid the hospital as much as possible given the pandemic.  However, if her symptoms worsen / do not improve despite increasing Lasix she is asked to go to the ER for further evaluation.  Will cc her primary cardiologist as well.   Telephone encounter: 14 minutes.  Rex Kras, DO, [facility]. [place] Office: [phone]

## 2019-12-25 ENCOUNTER — Other Ambulatory Visit: Payer: Self-pay | Admitting: Cardiology

## 2020-01-29 ENCOUNTER — Other Ambulatory Visit: Payer: Self-pay | Admitting: Cardiology

## 2020-01-29 DIAGNOSIS — I25118 Atherosclerotic heart disease of native coronary artery with other forms of angina pectoris: Secondary | ICD-10-CM

## 2020-04-02 ENCOUNTER — Other Ambulatory Visit: Payer: Self-pay | Admitting: Cardiology

## 2020-04-02 DIAGNOSIS — I5031 Acute diastolic (congestive) heart failure: Secondary | ICD-10-CM

## 2020-04-06 ENCOUNTER — Other Ambulatory Visit: Payer: Self-pay

## 2020-04-06 DIAGNOSIS — I5031 Acute diastolic (congestive) heart failure: Secondary | ICD-10-CM

## 2020-04-06 MED ORDER — FUROSEMIDE 20 MG PO TABS: 20.0000 mg | ORAL_TABLET | Freq: Two times a day (BID) | ORAL | 1 refills | Status: DC

## 2020-04-07 ENCOUNTER — Ambulatory Visit: Payer: Medicare HMO | Admitting: Cardiology

## 2020-04-28 ENCOUNTER — Encounter: Payer: Self-pay | Admitting: Cardiology

## 2020-04-28 ENCOUNTER — Ambulatory Visit: Payer: Medicare HMO | Admitting: Cardiology

## 2020-04-28 ENCOUNTER — Other Ambulatory Visit: Payer: Self-pay

## 2020-04-28 VITALS — BP 132/66 | HR 74 | Resp 15 | Ht <= 58 in | Wt 106.0 lb

## 2020-04-28 DIAGNOSIS — E7800 Pure hypercholesterolemia, unspecified: Secondary | ICD-10-CM

## 2020-04-28 DIAGNOSIS — I10 Essential (primary) hypertension: Secondary | ICD-10-CM

## 2020-04-28 DIAGNOSIS — I340 Nonrheumatic mitral (valve) insufficiency: Secondary | ICD-10-CM

## 2020-04-28 DIAGNOSIS — I5032 Chronic diastolic (congestive) heart failure: Secondary | ICD-10-CM

## 2020-04-28 DIAGNOSIS — I25118 Atherosclerotic heart disease of native coronary artery with other forms of angina pectoris: Secondary | ICD-10-CM

## 2020-04-28 MED ORDER — FUROSEMIDE 20 MG PO TABS: 20.0000 mg | ORAL_TABLET | Freq: Two times a day (BID) | ORAL | 3 refills | Status: DC

## 2020-04-28 NOTE — Progress Notes (Signed)
Primary Physician/Referring:  Buzzy Han, MD  Patient ID: Candace Ross, female    DOB: 1926-11-18, 86 y.o.   MRN: [id number]  Chief Complaint  Patient presents with   Mitral Regurgitation   Follow-up    6 month   HPI:    Candace Ross  is a 86 y.o. Caucasian female CAD S/P stenting to proximal and mid circumflex and RCA in July 3818, acute diastolic CHF on 07/08/91 due to new moderate to severe MR, hypertension, hyperlipidemia, asymptomatic carotid stenosis presents for 1 month follow up. She is a fairly active, living independently.  She is presently doing well. She has not had any worsening dyspnea, leg edema, no PND or orthopnea.  For occasional worsening dyspnea, she has been taking furosemide 3 times a day on a as needed basis with stabilization of her symptoms.  She has not had any recent hospitalization.  Continues to live independently.  This is her 28-monthoffice visit.   Past Medical History:  Diagnosis Date   Anemia    Back pain    [place]    Change in stool habits    Dribbling urine    H/O bone density study 2009   Hearing loss    Hypertension    Osteoporosis    Sinus problem    SOB (shortness of breath) on exertion    stairs   Spinal stenosis    Thyroid disease    Wears dentures    Past Surgical History:  Procedure Laterality Date   BARTHOLIN GLAND CYST EXCISION  1969   BREAST LUMPECTOMY  05/02/77   benign   CARDIAC CATHETERIZATION N/A 12/18/2014   Procedure: Left Heart Cath and Coronary Angiography;  Surgeon: JAdrian Prows MD;  [facility];  Service: Cardiovascular;  Laterality: N/A;   CARDIAC CATHETERIZATION N/A 12/18/2014   Procedure: Coronary Stent Intervention;  Surgeon: JAdrian Prows MD;  [facility];  Service: Cardiovascular;  Laterality: N/A;   HERNIA REPAIR  12/15/99   Family History  Problem Relation Age of Onset   Pneumonia Father    Colon cancer Brother      Social History   Tobacco Use   Smoking status: Never Smoker   Smokeless tobacco: Never Used  Substance Use Topics   Alcohol use: No   Marital Status: Widowed  ROS  Review of Systems  Constitutional: Negative for chills, decreased appetite, malaise/fatigue and weight gain.  Cardiovascular: Positive for dyspnea on exertion. Negative for leg swelling and syncope.  Endocrine: Negative for cold intolerance.  Hematologic/Lymphatic: Does not bruise/bleed easily.  Musculoskeletal: Positive for back pain (right leg sciatica) and joint pain. Negative for joint swelling.  Gastrointestinal: Negative for abdominal pain, anorexia, change in bowel habit, hematochezia and melena.  Neurological: Negative for headaches and light-headedness.  Psychiatric/Behavioral: Negative for depression and substance abuse.   Objective  Blood pressure 132/66, pulse 74, resp. rate 15, height 4' 9"  (1.448 m), weight 106 lb (48.1 kg), SpO2 96 %.  Vitals with BMI 04/28/2020 10/06/2019 08/12/2019  Height 4' 9"  4' 9"  -  Weight 106 lbs 120 lbs -  BMI 271.69267.89-  Systolic [id number] Diastolic 66 65 65  Pulse 74 58 73     Physical Exam Constitutional:      Appearance: She is well-developed.  HENT:     Head: Atraumatic.  Eyes:     Conjunctiva/sclera: Conjunctivae normal.  Neck:     Thyroid: No thyromegaly.  Vascular: No JVD.  Cardiovascular:     Rate and Rhythm: Normal rate and regular rhythm.     Pulses: Intact distal pulses.     Heart sounds: Murmur heard.  Holosystolic murmur is present with a grade of 3/6 at the apex.  Low-pitched early systolic murmur of grade 2/6 is also present at the upper right sternal border radiating to the neck. There is faint bilateral carotid artery bruit.  Distal pulses were normal.  No leg edema.    No gallop.   Pulmonary:     Effort: Pulmonary effort is normal. No accessory muscle usage or respiratory distress.     Breath sounds: Normal breath sounds.  Abdominal:      General: Bowel sounds are normal.     Palpations: Abdomen is soft.  Musculoskeletal:        General: Normal range of motion.     Cervical back: Neck supple.  Skin:    General: Skin is warm and dry.  Neurological:     Mental Status: She is alert.    Laboratory examination:   No results for input(s): NA, K, CL, CO2, GLUCOSE, BUN, CREATININE, CALCIUM, GFRNONAA, GFRAA in the last 8760 hours. CrCl cannot be calculated (Patient's most recent lab result is older than the maximum 21 days allowed.).  CMP Latest Ref Rng & Units 04/11/2019 12/01/2018 11/30/2018  Glucose 65 - 99 mg/dL 97 103(H) 118(H)  BUN 10 - 36 mg/dL 15 14 11   Creatinine 0.57 - 1.00 mg/dL 1.01(H) 1.13(H) 0.90  Sodium 134 - 144 mmol/L 134 132(L) 134(L)  Potassium 3.5 - 5.2 mmol/L 4.0 4.1 3.7  Chloride 96 - 106 mmol/L 96 97(L) 102  CO2 20 - 29 mmol/L 24 25 21(L)  Calcium 8.7 - 10.3 mg/dL 9.6 8.5(L) 8.8(L)  Total Protein 6.0 - 8.5 g/dL 7.0 - -  Total Bilirubin 0.0 - 1.2 mg/dL 0.6 - -  Alkaline Phos 39 - 117 IU/L 89 - -  AST 0 - 40 IU/L 17 - -  ALT 0 - 32 IU/L 9 - -   CBC Latest Ref Rng & Units 04/11/2019 11/29/2018 11/29/2018  WBC 3.4 - 10.8 x10E3/uL 7.1 10.0 7.5  Hemoglobin 11.1 - 15.9 g/dL 12.2 12.7 11.4(L)  Hematocrit 34.0 - 46.6 % 37.4 39.0 35.0(L)  Platelets 150 - 450 x10E3/uL 264 267 248   Lipid Panel     Component Value Date/Time   CHOL 201 (H) 01/22/2016 0447   TRIG 131 01/22/2016 0447   HDL 59 01/22/2016 0447   CHOLHDL 3.4 01/22/2016 0447   VLDL 26 01/22/2016 0447   LDLCALC 116 (H) 01/22/2016 0447   HEMOGLOBIN A1C No results found for: HGBA1C, MPG TSH No results for input(s): TSH in the last 8760 hours.  External labs:   Hemoglobin 12.200 G/ 04/11/2019 Platelets 264.000 04/11/2019  Creatinine, Serum 1.010 04/11/2019 Potassium 4.000 04/11/2019 Magnesium N/D ALT (SGPT) 9.000 04/11/2019  TSH 3.160 04/11/2019  BNP 155.400 P 09/28/2017   Medications and allergies   Allergies  Allergen Reactions    Penicillins Other (See Comments)    NOT Allergy to penicillin  Allergic to FILLER ONLY  Non active ingredient   Other Other (See Comments)    "some medicine they gave me in the hospital, it was a little purple sleeping  pill, to help me sleep didn't work; it made me mean"     Current Outpatient Medications  Medication Instructions   acetaminophen (TYLENOL) 500 mg, Oral, Every 6 hours PRN   aspirin 81 mg,  Oral, Daily after breakfast   atorvastatin (LIPITOR) 20 MG tablet TAKE 1 TABLET EVERY DAY   Cholecalciferol (VITAMIN D3 PO) 1 tablet, Oral, Daily   Cyanocobalamin (VITAMIN B-12 PO) 1 tablet, Oral, Weekly   dorzolamide (TRUSOPT) 2 % ophthalmic solution 2 drops, Both Eyes, 2 times daily   Ferrous Sulfate (IRON PO) 1 tablet, Oral, Weekly   furosemide (LASIX) 20 mg, Oral, 2 times daily, Take additional tab once daily for shortness of breath   isosorbide mononitrate (IMDUR) 60 MG 24 hr tablet TAKE 1 TABLET EVERY DAY   latanoprost (XALATAN) 0.005 % ophthalmic solution 2 drops, Both Eyes, Daily at bedtime   levothyroxine (SYNTHROID) 100 mcg, Oral, Daily before breakfast   losartan (COZAAR) 50 mg, Oral, Daily at bedtime   MAGNESIUM PO 1 tablet, Oral, Every 1 hour PRN   metoprolol succinate (TOPROL-XL) 25 mg, Oral, Daily   nitroGLYCERIN (NITROSTAT) 0.4 mg, Sublingual, Every 5 min PRN   potassium chloride (KLOR-CON) 8 MEQ tablet TAKE 1 TABLET TWICE DAILY WITH FUROSEMIDE    VITAMIN E PO 1 capsule, Oral, Every other day   Radiology:   No results found.  Cardiac Studies:   Coronary angiogram 12/18/2014: Stenting of proximal and mid circumflex with a 3.0 x 38 mm resolute integrity DES, 90% stenosis, stenting of the mid RCA with a 2.75 x 26 mm resolute integrity DES, 99% stenosis, both reduced to 0%. LVEF 45% with mid to distal anterolateral akinesis, inferolateral mild hypokinesis.  Nuclear stress test 01/23/2016: Large fixed defect involving the mid and distal anterior  wall, anterior septum, and anterior lateral wall with hypokinesis. No reversibility identified. LVEF 60%.  Carotid artery duplex 11/13/2016: Stenosis in the right internal carotid artery (50-69%), lower end of spectrum. Stenosis in the right external carotid artery (<50%). Stenosis in the left internal carotid artery (16-49%). Antegrade right vertebral artery flow. Antegrade left vertebral artery flow. No change from 05/25/2016. Follow up in six months is appropriate if clinically indicated.  Echocardiogram 11/30/2018 :   1. The left ventricle has normal systolic function with an ejection fraction of 60-65%. The cavity size was normal. There is mild concentric left ventricular hypertrophy. Left ventricular diastolic Doppler parameters are consistent with pseudonormalization. Elevated left atrial and left ventricular end-diastolic pressures. Basal septal hypertrophy. No LVOT obstruction. Presence of mitral apparatus calcification makes diastololgy not accurate. 2 Left atrial size was moderately dilated. 3. Mild mitral subvalvular calcification. Moderate mitral valve prolapse. The mitral valve is degenerative. Moderate thickening of the mitral valve leaflet. Moderate calcification of the mitral valve leaflet. Mitral valve regurgitation is moderate to severe by color flow Doppler. The MR jet is eccentric anteriorly directed. 4. Tricuspid valve regurgitation is moderate.  Pulmonary hypertension is moderate. PASP 43 mm Hg.  5. The aortic valve is tricuspid. Mild thickening of the aortic valve. Mild calcification of the aortic valve. Aortic valve regurgitation is trivial by color flow Doppler. There is evidence of mild plaque in the aortic root.  EKG  EKG 04/28/2020: Normal sinus rhythm at rate of 77 bpm, left atrial enlargement, left bundle branch block.  No further analysis.   No significant change from EKG 10/06/2019   Assessment     ICD-10-CM   1. Severe mitral regurgitation  I34.0 PCV  ECHOCARDIOGRAM COMPLETE  2. Coronary artery disease of native artery of native heart with stable angina pectoris ([place])  I25.118   3. Chronic diastolic heart failure (HCC)  I50.32 furosemide (LASIX) 20 MG tablet  4. Primary hypertension  I10 EKG  12-Lead    PCV ECHOCARDIOGRAM COMPLETE    CBC    CMP14+EGFR  5. Hypercholesteremia  E78.00 Lipid Panel With LDL/HDL Ratio   Meds ordered this encounter  Medications   furosemide (LASIX) 20 MG tablet    Sig: Take 1 tablet (20 mg total) by mouth 2 (two) times daily. Take additional tab once daily for shortness of breath    Dispense:  220 tablet    Refill:  3    Medications Discontinued During This Encounter  Medication Reason   furosemide (LASIX) 20 MG tablet Reorder    Recommendations:   WAYNESHA RAMMEL  is a  86 y.o. female  CAD S/P stenting to proximal and mid circumflex and RCA in July 3578, acute diastolic CHF on 02/03/83 due to new moderate to severe MR, hypertension, hyperlipidemia, asymptomatic carotid stenosis presents for 3 month follow up. She is a fairly active, living independent.  Fortunately she has not had any further episodes of hospitalization.  Patient is presently doing well and is well compensated.  She has been taking furosemide extra doses as needed for worsening dyspnea.  I would like to repeat echocardiogram to follow-up on her LVEF and also mitral regurgitation.  Blood pressure is also well controlled and she remains asymptomatic, no changes in the medications were done today.  I reviewed her external labs, she has not had any recent labs within the past 6 months including lipids, orders were placed.  She is presently on a statin.  With regard to coronary artery disease she has not had any recurrence of angina pectoris.  She is tolerating statin and aspirin.  I will see her back in 6 months.     Adrian Prows, MD, [facility] 04/28/2020, 2:59 PM Office: [phone] Pager: [phone]

## 2020-04-28 NOTE — Patient Instructions (Signed)
Please have lab work done at [facility] any day.  He can take extra dose of Lasix if needed for shortness of breath.  You have been scheduled for echocardiogram to follow-up on your mitral valve regurgitation.  I will see you back in 6 months.

## 2020-04-29 ENCOUNTER — Other Ambulatory Visit: Payer: Self-pay

## 2020-04-29 DIAGNOSIS — I5032 Chronic diastolic (congestive) heart failure: Secondary | ICD-10-CM

## 2020-04-29 MED ORDER — FUROSEMIDE 20 MG PO TABS: 20.0000 mg | ORAL_TABLET | Freq: Two times a day (BID) | ORAL | 3 refills | Status: DC

## 2020-05-05 ENCOUNTER — Other Ambulatory Visit: Payer: Self-pay | Admitting: Cardiology

## 2020-05-05 DIAGNOSIS — I5031 Acute diastolic (congestive) heart failure: Secondary | ICD-10-CM

## 2020-05-07 LAB — CBC
Hematocrit: 36.3 % (ref 34.0–46.6)
Hemoglobin: 12.2 g/dL (ref 11.1–15.9)
MCH: 28.9 pg (ref 26.6–33.0)
MCHC: 33.6 g/dL (ref 31.5–35.7)
MCV: 86 fL (ref 79–97)
Platelets: 234 10*3/uL (ref 150–450)
RBC: 4.22 x10E6/uL (ref 3.77–5.28)
RDW: 13.7 % (ref 11.7–15.4)
WBC: 7.2 10*3/uL (ref 3.4–10.8)

## 2020-05-07 LAB — CMP14+EGFR
ALT: 12 IU/L (ref 0–32)
AST: 14 IU/L (ref 0–40)
Albumin/Globulin Ratio: 1.7 (ref 1.2–2.2)
Albumin: 4.5 g/dL (ref 3.5–4.6)
Alkaline Phosphatase: 100 IU/L (ref 44–121)
BUN/Creatinine Ratio: 13 (ref 12–28)
BUN: 14 mg/dL (ref 10–36)
Bilirubin Total: 0.7 mg/dL (ref 0.0–1.2)
CO2: 23 mmol/L (ref 20–29)
Calcium: 9.3 mg/dL (ref 8.7–10.3)
Chloride: 99 mmol/L (ref 96–106)
Creatinine, Ser: 1.11 mg/dL — ABNORMAL HIGH (ref 0.57–1.00)
GFR calc Af Amer: 50 mL/min/{1.73_m2} — ABNORMAL LOW (ref >59)
GFR calc non Af Amer: 44 mL/min/{1.73_m2} — ABNORMAL LOW (ref >59)
Globulin, Total: 2.7 g/dL (ref 1.5–4.5)
Glucose: 98 mg/dL (ref 65–99)
Potassium: 4.3 mmol/L (ref 3.5–5.2)
Sodium: 137 mmol/L (ref 134–144)
Total Protein: 7.2 g/dL (ref 6.0–8.5)

## 2020-05-07 LAB — LIPID PANEL WITH LDL/HDL RATIO
Cholesterol, Total: 157 mg/dL (ref 100–199)
HDL: 65 mg/dL (ref >39)
LDL Chol Calc (NIH): 69 mg/dL (ref 0–99)
LDL/HDL Ratio: 1.1 ratio (ref 0.0–3.2)
Triglycerides: 136 mg/dL (ref 0–149)
VLDL Cholesterol Cal: 23 mg/dL (ref 5–40)

## 2020-05-07 NOTE — Progress Notes (Signed)
Called and spoke with patient regarding her lab results.

## 2020-05-12 ENCOUNTER — Other Ambulatory Visit: Payer: Self-pay | Admitting: Cardiology

## 2020-06-15 ENCOUNTER — Telehealth: Payer: Self-pay | Admitting: Physical Medicine and Rehabilitation

## 2020-06-15 NOTE — Telephone Encounter (Signed)
Gave you script

## 2020-06-15 NOTE — Telephone Encounter (Signed)
Please advise 

## 2020-06-15 NOTE — Telephone Encounter (Signed)
Patient called requesting a call back. Patient states Dr. Ernestina Patches had given her a written diagnosis of her medical problem on his script medical pads and she has misplaced them to give to her upcoming chiropractor appt. Patient is asking for Dr. Ernestina Patches to rewrite out her medical diagnosis. Patient phone number is [phone].

## 2020-06-16 NOTE — Telephone Encounter (Signed)
Mailed to patient

## 2020-06-24 ENCOUNTER — Other Ambulatory Visit: Payer: Self-pay | Admitting: Cardiology

## 2020-06-24 DIAGNOSIS — I25118 Atherosclerotic heart disease of native coronary artery with other forms of angina pectoris: Secondary | ICD-10-CM

## 2020-10-06 ENCOUNTER — Ambulatory Visit: Payer: Medicare HMO

## 2020-10-06 ENCOUNTER — Other Ambulatory Visit: Payer: Self-pay

## 2020-10-06 DIAGNOSIS — I340 Nonrheumatic mitral (valve) insufficiency: Secondary | ICD-10-CM

## 2020-10-06 DIAGNOSIS — I10 Essential (primary) hypertension: Secondary | ICD-10-CM

## 2020-10-07 ENCOUNTER — Other Ambulatory Visit: Payer: Medicare HMO

## 2020-10-11 NOTE — Progress Notes (Signed)
Normal LV systolic function, severe MR, no change from prior study 11/30/2018.  We will discuss on office visit soon.  Echocardiogram 10/06/2020: Normal LV systolic function with visual EF 55-60%. Left ventricle cavity is normal in size. Normal global wall motion. Basal septal hypertrophy. Diastolic dysfunction not accurate due to MAC but doppler would be consistent with grade III restrictive filling pressure and elevated LAP.   Left atrial cavity is severely dilated.  Mild to moderate aortic regurgitation. Degenerative and calcific mitral valve with subvalvular calcification. Mild to moderate prolapse, Eccentric jet wraps along the IAS consistent with severe (Grade III) mitral regurgitation. Moderate tricuspid valve regurgitation. Moderate pulmonary hypertension. RVSP measures 67 mmHg. Mild pulmonic regurgitation. IVC is dilated with a respiratory response of <50%. Compared to prior study dated 11/30/2018: LAE was moderately dilated now severe otherwise no significant change.

## 2020-10-27 ENCOUNTER — Other Ambulatory Visit: Payer: Self-pay

## 2020-10-27 ENCOUNTER — Ambulatory Visit: Payer: Medicare HMO | Admitting: Cardiology

## 2020-10-27 ENCOUNTER — Encounter: Payer: Self-pay | Admitting: Cardiology

## 2020-10-27 VITALS — BP 136/70 | HR 77 | Temp 98.4°F | Resp 16 | Ht <= 58 in | Wt 100.0 lb

## 2020-10-27 DIAGNOSIS — I10 Essential (primary) hypertension: Secondary | ICD-10-CM

## 2020-10-27 DIAGNOSIS — I5032 Chronic diastolic (congestive) heart failure: Secondary | ICD-10-CM

## 2020-10-27 DIAGNOSIS — I340 Nonrheumatic mitral (valve) insufficiency: Secondary | ICD-10-CM

## 2020-10-27 DIAGNOSIS — I25118 Atherosclerotic heart disease of native coronary artery with other forms of angina pectoris: Secondary | ICD-10-CM

## 2020-10-27 MED ORDER — NITROGLYCERIN 0.4 MG SL SUBL: 0.4000 mg | SUBLINGUAL_TABLET | SUBLINGUAL | 3 refills | Status: AC | PRN

## 2020-10-27 NOTE — Progress Notes (Signed)
Primary Physician/Referring:  Buzzy Han, MD  Patient ID: Candace Ross, female    DOB: July 30, 1926, 87 y.o.   MRN: [id number]  Chief Complaint  Patient presents with  . Mitral Regurgitation  . Congestive Heart Failure  . Results    Echocardiogram and lab   HPI:    Candace Ross  is a 87 y.o. Caucasian female CAD S/P stenting to proximal and mid circumflex and RCA in July 7341, acute diastolic CHF on 01/29/78 due to new moderate to severe MR, hypertension, hyperlipidemia, asymptomatic carotid stenosis presents for 6 month follow up. She is a fairly active, living independently.  She is presently doing well. She has not had any worsening dyspnea, leg edema, no PND or orthopnea.  For occasional worsening dyspnea, she has been taking furosemide 3 times a day on a as needed basis with stabilization of her symptoms.  She also has chest pain for which she takes sublingual nitroglycerin on a as needed basis.  States that all her symptoms are remained stable. She has not had any recent hospitalization.    Past Medical History:  Diagnosis Date  . Anemia   . Back pain   . Breast cancer ([place])   . Change in stool habits   . Dribbling urine   . H/O bone density study 2009  . Hearing loss   . Hypertension   . Osteoporosis   . Sinus problem   . SOB (shortness of breath) on exertion    stairs  . Spinal stenosis   . Thyroid disease   . Wears dentures    Past Surgical History:  Procedure Laterality Date  . BARTHOLIN GLAND CYST EXCISION  1969  . BREAST LUMPECTOMY  05/02/77   benign  . CARDIAC CATHETERIZATION N/A 12/18/2014   Procedure: Left Heart Cath and Coronary Angiography;  Surgeon: Adrian Prows, MD;  Location: [facility];  Service: Cardiovascular;  Laterality: N/A;  . CARDIAC CATHETERIZATION N/A 12/18/2014   Procedure: Coronary Stent Intervention;  Surgeon: Adrian Prows, MD;  Location: [facility];  Service: Cardiovascular;  Laterality: N/A;  . HERNIA REPAIR   12/15/99   Family History  Problem Relation Age of Onset  . Pneumonia Father   . Colon cancer Brother     Social History   Tobacco Use  . Smoking status: Never Smoker  . Smokeless tobacco: Never Used  Substance Use Topics  . Alcohol use: No   Marital Status: Widowed  ROS  Review of Systems  Cardiovascular: Positive for chest pain and dyspnea on exertion (minimal and stable). Negative for leg swelling.  Gastrointestinal: Negative for melena.   Objective  Blood pressure 136/70, pulse 77, temperature 98.4 F (36.9 C), temperature source Temporal, resp. rate 16, height _0  (1.448 m), weight 100 lb (45.4 kg), SpO2 96 %.  Vitals with BMI 10/27/2020 04/28/2020 10/06/2019  Height _1  _2  _3   Weight 100 lbs 106 lbs 120 lbs  BMI 21.63 02.40 97.35  Systolic 329 924 268  Diastolic 70 66 65  Pulse 77 74 58     Physical Exam Constitutional:      Appearance: She is well-developed.  HENT:     Head: Atraumatic.  Eyes:     Conjunctiva/sclera: Conjunctivae normal.  Neck:     Thyroid: No thyromegaly.     Vascular: No carotid bruit or JVD.  Cardiovascular:     Rate and Rhythm: Normal rate and regular rhythm.     Pulses: Intact distal  pulses.          Femoral pulses are 2+ on the right side and 2+ on the left side.      Dorsalis pedis pulses are 1+ on the right side and 1+ on the left side.       Posterior tibial pulses are 1+ on the right side and 1+ on the left side.     Heart sounds: Murmur heard.   Holosystolic murmur is present with a grade of 3/6 at the lower left sternal border and apex.  Low-pitched early systolic murmur of grade 2/6 is also present at the upper right sternal border. There is faint bilateral carotid artery bruit.  Distal pulses were normal.  No leg edema.   No gallop.   Pulmonary:     Effort: Pulmonary effort is normal. No accessory muscle usage or respiratory distress.     Breath sounds: Normal breath sounds.  Abdominal:     General: Bowel sounds are  normal.     Palpations: Abdomen is soft.  Musculoskeletal:        General: Normal range of motion.     Cervical back: Neck supple.  Skin:    General: Skin is warm and dry.  Neurological:     Mental Status: She is alert.    Laboratory examination:   Recent Labs    05/06/20 1354  NA 137  K 4.3  CL 99  CO2 23  GLUCOSE 98  BUN 14  CREATININE 1.11*  CALCIUM 9.3  GFRNONAA 44*  GFRAA 50*    Lipid Panel Recent Labs    05/06/20 1354  CHOL 157  TRIG 136  LDLCALC 69  HDL 65    External labs:   Hemoglobin 12.200 G/ 04/11/2019 Platelets 264.000 04/11/2019  Creatinine, Serum 1.010 04/11/2019 Potassium 4.000 04/11/2019 Magnesium N/D ALT (SGPT) 9.000 04/11/2019  TSH 3.160 04/11/2019  BNP 155.400 P 09/28/2017   Medications and allergies   Allergies  Allergen Reactions  . Penicillins Other (See Comments)    NOT Allergy to penicillin  Allergic to FILLER ONLY  Non active ingredient  . Other Other (See Comments)    "some medicine they gave me in the hospital, it was a little purple sleeping  pill, to help me sleep didn't work; it made me mean"     Current Outpatient Medications  Medication Instructions  . acetaminophen (TYLENOL) 500 mg, Oral, Every 6 hours PRN  . aspirin 81 mg, Oral, Daily after breakfast  . atorvastatin (LIPITOR) 20 MG tablet TAKE 1 TABLET EVERY DAY  . Cholecalciferol (VITAMIN D3 PO) 1 tablet, Oral, Daily  . Cyanocobalamin (VITAMIN B-12 PO) 1 tablet, Oral, Weekly  . dorzolamide (TRUSOPT) 2 % ophthalmic solution 2 drops, Both Eyes, 2 times daily  . Ferrous Sulfate (IRON PO) 1 tablet, Oral, Weekly  . furosemide (LASIX) 20 mg, Oral, 2 times daily, Take additional tab once daily for shortness of breath  . isosorbide mononitrate (IMDUR) 60 MG 24 hr tablet TAKE 1 TABLET EVERY DAY  . latanoprost (XALATAN) 0.005 % ophthalmic solution 2 drops, Both Eyes, Daily at bedtime  . levothyroxine (SYNTHROID) 75 mcg, Oral, Daily before breakfast  . losartan  (COZAAR) 50 mg, Oral, Daily at bedtime  . MAGNESIUM PO 1 tablet, Oral, Every 1 hour PRN  . metoprolol succinate (TOPROL-XL) 25 mg, Oral, Daily  . nitroGLYCERIN (NITROSTAT) 0.4 mg, Sublingual, Every 5 min PRN  . potassium chloride (KLOR-CON) 8 MEQ tablet TAKE 1 TABLET TWICE DAILY WITH FUROSEMIDE  . VITAMIN  E PO 1 capsule, Oral, Every other day   Radiology:   No results found.  Cardiac Studies:   Coronary angiogram 12/18/2014: Stenting of proximal and mid circumflex with a 3.0 x 38 mm resolute integrity DES, 90% stenosis, stenting of the mid RCA with a 2.75 x 26 mm resolute integrity DES, 99% stenosis, both reduced to 0%. LVEF 45% with mid to distal anterolateral akinesis, inferolateral mild hypokinesis.  Nuclear stress test 01/23/2016: Large fixed defect involving the mid and distal anterior wall, anterior septum, and anterior lateral wall with hypokinesis. No reversibility identified. LVEF 60%.  Carotid artery duplex 11/13/2016: Stenosis in the right internal carotid artery (50-69%), lower end of spectrum. Stenosis in the right external carotid artery (<50%). Stenosis in the left internal carotid artery (16-49%). Antegrade right vertebral artery flow. Antegrade left vertebral artery flow. No change from 05/25/2016. Follow up in six months is appropriate if clinically indicated.  Echocardiogram 10/06/2020: Normal LV systolic function with visual EF 55-60%. Left ventricle cavity is normal in size. Normal global wall motion. Basal septal hypertrophy. Diastolic dysfunction not accurate due to MAC but doppler would be consistent with grade III restrictive filling pressure and elevated LAP.  Left atrial cavity is severely dilated.  Mild to moderate aortic regurgitation. Degenerative and calcific mitral valve with subvalvular calcification. Mild to moderate prolapse, Eccentric jet wraps along the IAS consistent with severe (Grade III) mitral regurgitation. Moderate tricuspid valve regurgitation.  Moderate pulmonary hypertension. RVSP measures 67 mmHg. Mild pulmonic regurgitation. IVC is dilated with a respiratory response of <50%. Compared to prior study dated 11/30/2018: LAE was moderately dilated now severe otherwise no significant change.   EKG  EKG 04/28/2020: Normal sinus rhythm at rate of 77 bpm, left atrial enlargement, left bundle branch block.  No further analysis.   No significant change from EKG 10/06/2019   Assessment     ICD-10-CM   1. Severe mitral regurgitation  I34.0 CANCELED: EKG 12-Lead  2. Coronary artery disease of native artery of native heart with stable angina pectoris (HCC)  I25.118 nitroGLYCERIN (NITROSTAT) 0.4 MG SL tablet  3. Chronic diastolic heart failure (HCC)  I50.32   4. Primary hypertension  I10    Meds ordered this encounter  Medications  . nitroGLYCERIN (NITROSTAT) 0.4 MG SL tablet    Sig: Place 1 tablet (0.4 mg total) under the tongue every 5 (five) minutes as needed for chest pain.    Dispense:  90 tablet    Refill:  3    Medications Discontinued During This Encounter  Medication Reason  . nitroGLYCERIN (NITROSTAT) 0.4 MG SL tablet Reorder    Recommendations:   Candace Ross  is a  87 y.o. female  CAD S/P stenting to proximal and mid circumflex and RCA in July 0355, acute diastolic CHF on 02/03/40 due to new moderate to severe MR, hypertension, hyperlipidemia, asymptomatic carotid stenosis presents for 3 month follow up. She is a fairly active, living independent.  Fortunately she has not had any further episodes of hospitalization.  Patient is presently doing well and is well compensated.  She has been taking furosemide extra doses as needed for worsening dyspnea and also takes sublingual nitroglycerin for chest tightness on a as needed basis.  She is on a statin lipids are well controlled.  She is also on aspirin tolerating this.  Blood pressure is well controlled, mitral regurgitation murmur is unchanged.  Continue present  medications, I will see her back in 6 months or sooner if problems.  Adrian Prows, MD, [facility] 10/27/2020, 3:48 PM Office: [phone] Pager: [phone]

## 2020-11-15 ENCOUNTER — Other Ambulatory Visit: Payer: Self-pay | Admitting: Cardiology

## 2020-11-15 DIAGNOSIS — I25118 Atherosclerotic heart disease of native coronary artery with other forms of angina pectoris: Secondary | ICD-10-CM

## 2020-11-16 ENCOUNTER — Other Ambulatory Visit: Payer: Self-pay

## 2020-11-16 DIAGNOSIS — I25118 Atherosclerotic heart disease of native coronary artery with other forms of angina pectoris: Secondary | ICD-10-CM

## 2020-11-16 MED ORDER — ISOSORBIDE MONONITRATE ER 60 MG PO TB24: 60.0000 mg | ORAL_TABLET | Freq: Every day | ORAL | 1 refills | Status: DC

## 2021-01-20 ENCOUNTER — Telehealth: Payer: Self-pay | Admitting: Cardiology

## 2021-01-20 ENCOUNTER — Other Ambulatory Visit: Payer: Self-pay

## 2021-01-20 DIAGNOSIS — I5031 Acute diastolic (congestive) heart failure: Secondary | ICD-10-CM

## 2021-01-20 MED ORDER — POTASSIUM CHLORIDE ER 8 MEQ PO TBCR: EXTENDED_RELEASE_TABLET | ORAL | 2 refills | Status: DC

## 2021-01-20 NOTE — Telephone Encounter (Signed)
Pt says we were supposed to receive fax for new potassium prescription; should be expecting another fax for her prescription; wondered if it has been received/if anyone has gotten around to getting this completed.

## 2021-01-21 ENCOUNTER — Other Ambulatory Visit: Payer: Self-pay

## 2021-01-21 DIAGNOSIS — I5031 Acute diastolic (congestive) heart failure: Secondary | ICD-10-CM

## 2021-01-21 MED ORDER — POTASSIUM CHLORIDE ER 8 MEQ PO TBCR: EXTENDED_RELEASE_TABLET | ORAL | 2 refills | Status: DC

## 2021-01-21 NOTE — Telephone Encounter (Signed)
Refilled medication

## 2021-02-07 ENCOUNTER — Telehealth: Payer: Self-pay

## 2021-02-07 NOTE — Telephone Encounter (Signed)
Patient has taken 2 nitroglycerin tablets this morning with only brief relief of symptoms.  Advised patient to go to the emergency room for further evaluation, however patient is resistant.  Therefore advised her to take Lasix as she has not done so and to monitor her blood pressure and heart rate, advised patient that if blood pressure remains >130/80 mmHg she may take an additional 25 mg of metoprolol today.  Patient verbalized understanding and agreement with the plan.

## 2021-02-11 ENCOUNTER — Other Ambulatory Visit: Payer: Self-pay

## 2021-02-11 DIAGNOSIS — I5032 Chronic diastolic (congestive) heart failure: Secondary | ICD-10-CM

## 2021-02-11 MED ORDER — FUROSEMIDE 20 MG PO TABS: 20.0000 mg | ORAL_TABLET | Freq: Two times a day (BID) | ORAL | 3 refills | Status: DC

## 2021-03-05 ENCOUNTER — Other Ambulatory Visit: Payer: Self-pay

## 2021-03-05 ENCOUNTER — Encounter (HOSPITAL_COMMUNITY): Payer: Self-pay | Admitting: Emergency Medicine

## 2021-03-05 ENCOUNTER — Inpatient Hospital Stay (HOSPITAL_COMMUNITY)
Admission: EM | Admit: 2021-03-05 | Discharge: 2021-03-11 | DRG: 291 | Disposition: A | Discharge status: Skilled Nursing Facility | Payer: Medicare HMO | Attending: Family Medicine | Admitting: Family Medicine

## 2021-03-05 ENCOUNTER — Emergency Department (HOSPITAL_COMMUNITY): Payer: Medicare HMO

## 2021-03-05 DIAGNOSIS — I208 Other forms of angina pectoris: Secondary | ICD-10-CM | POA: Diagnosis not present

## 2021-03-05 DIAGNOSIS — Z20822 Contact with and (suspected) exposure to covid-19: Secondary | ICD-10-CM | POA: Diagnosis present

## 2021-03-05 DIAGNOSIS — H3530 Unspecified macular degeneration: Secondary | ICD-10-CM | POA: Diagnosis present

## 2021-03-05 DIAGNOSIS — M810 Age-related osteoporosis without current pathological fracture: Secondary | ICD-10-CM | POA: Diagnosis present

## 2021-03-05 DIAGNOSIS — I447 Left bundle-branch block, unspecified: Secondary | ICD-10-CM | POA: Diagnosis present

## 2021-03-05 DIAGNOSIS — I6523 Occlusion and stenosis of bilateral carotid arteries: Secondary | ICD-10-CM | POA: Diagnosis present

## 2021-03-05 DIAGNOSIS — R778 Other specified abnormalities of plasma proteins: Secondary | ICD-10-CM | POA: Diagnosis present

## 2021-03-05 DIAGNOSIS — I340 Nonrheumatic mitral (valve) insufficiency: Secondary | ICD-10-CM | POA: Diagnosis present

## 2021-03-05 DIAGNOSIS — Z638 Other specified problems related to primary support group: Secondary | ICD-10-CM

## 2021-03-05 DIAGNOSIS — H9190 Unspecified hearing loss, unspecified ear: Secondary | ICD-10-CM | POA: Diagnosis not present

## 2021-03-05 DIAGNOSIS — R0600 Dyspnea, unspecified: Secondary | ICD-10-CM | POA: Diagnosis present

## 2021-03-05 DIAGNOSIS — E782 Mixed hyperlipidemia: Secondary | ICD-10-CM | POA: Diagnosis present

## 2021-03-05 DIAGNOSIS — R54 Age-related physical debility: Secondary | ICD-10-CM | POA: Diagnosis present

## 2021-03-05 DIAGNOSIS — R531 Weakness: Secondary | ICD-10-CM | POA: Diagnosis not present

## 2021-03-05 DIAGNOSIS — I4819 Other persistent atrial fibrillation: Secondary | ICD-10-CM | POA: Diagnosis not present

## 2021-03-05 DIAGNOSIS — E871 Hypo-osmolality and hyponatremia: Secondary | ICD-10-CM | POA: Diagnosis present

## 2021-03-05 DIAGNOSIS — N1832 Chronic kidney disease, stage 3b: Secondary | ICD-10-CM | POA: Diagnosis present

## 2021-03-05 DIAGNOSIS — I25119 Atherosclerotic heart disease of native coronary artery with unspecified angina pectoris: Secondary | ICD-10-CM | POA: Diagnosis not present

## 2021-03-05 DIAGNOSIS — J9601 Acute respiratory failure with hypoxia: Secondary | ICD-10-CM | POA: Diagnosis not present

## 2021-03-05 DIAGNOSIS — Z853 Personal history of malignant neoplasm of breast: Secondary | ICD-10-CM

## 2021-03-05 DIAGNOSIS — I4891 Unspecified atrial fibrillation: Secondary | ICD-10-CM | POA: Diagnosis not present

## 2021-03-05 DIAGNOSIS — Z66 Do not resuscitate: Secondary | ICD-10-CM | POA: Diagnosis not present

## 2021-03-05 DIAGNOSIS — I5043 Acute on chronic combined systolic (congestive) and diastolic (congestive) heart failure: Secondary | ICD-10-CM | POA: Diagnosis present

## 2021-03-05 DIAGNOSIS — D72829 Elevated white blood cell count, unspecified: Secondary | ICD-10-CM | POA: Diagnosis present

## 2021-03-05 DIAGNOSIS — I130 Hypertensive heart and chronic kidney disease with heart failure and stage 1 through stage 4 chronic kidney disease, or unspecified chronic kidney disease: Secondary | ICD-10-CM | POA: Diagnosis present

## 2021-03-05 DIAGNOSIS — Z800 Family history of malignant neoplasm of digestive organs: Secondary | ICD-10-CM

## 2021-03-05 DIAGNOSIS — I5033 Acute on chronic diastolic (congestive) heart failure: Secondary | ICD-10-CM | POA: Insufficient documentation

## 2021-03-05 DIAGNOSIS — Z955 Presence of coronary angioplasty implant and graft: Secondary | ICD-10-CM

## 2021-03-05 DIAGNOSIS — Z79899 Other long term (current) drug therapy: Secondary | ICD-10-CM

## 2021-03-05 DIAGNOSIS — I2510 Atherosclerotic heart disease of native coronary artery without angina pectoris: Secondary | ICD-10-CM | POA: Diagnosis present

## 2021-03-05 DIAGNOSIS — I259 Chronic ischemic heart disease, unspecified: Secondary | ICD-10-CM | POA: Diagnosis not present

## 2021-03-05 DIAGNOSIS — H547 Unspecified visual loss: Secondary | ICD-10-CM | POA: Diagnosis not present

## 2021-03-05 DIAGNOSIS — N183 Chronic kidney disease, stage 3 unspecified: Secondary | ICD-10-CM | POA: Diagnosis not present

## 2021-03-05 DIAGNOSIS — G8929 Other chronic pain: Secondary | ICD-10-CM | POA: Diagnosis present

## 2021-03-05 DIAGNOSIS — N179 Acute kidney failure, unspecified: Secondary | ICD-10-CM | POA: Diagnosis present

## 2021-03-05 DIAGNOSIS — I25118 Atherosclerotic heart disease of native coronary artery with other forms of angina pectoris: Secondary | ICD-10-CM

## 2021-03-05 DIAGNOSIS — E039 Hypothyroidism, unspecified: Secondary | ICD-10-CM | POA: Diagnosis present

## 2021-03-05 DIAGNOSIS — J189 Pneumonia, unspecified organism: Secondary | ICD-10-CM | POA: Diagnosis present

## 2021-03-05 DIAGNOSIS — R079 Chest pain, unspecified: Secondary | ICD-10-CM | POA: Diagnosis not present

## 2021-03-05 DIAGNOSIS — I10 Essential (primary) hypertension: Secondary | ICD-10-CM

## 2021-03-05 DIAGNOSIS — I482 Chronic atrial fibrillation, unspecified: Secondary | ICD-10-CM | POA: Diagnosis not present

## 2021-03-05 LAB — TROPONIN I (HIGH SENSITIVITY)
Troponin I (High Sensitivity): 20 ng/L — ABNORMAL HIGH (ref <18)
Troponin I (High Sensitivity): 19 ng/L — ABNORMAL HIGH (ref <18)

## 2021-03-05 LAB — PROTIME-INR
INR: 1 (ref 0.8–1.2)
Prothrombin Time: 13.2 seconds (ref 11.4–15.2)

## 2021-03-05 LAB — CBC
HCT: 35 % — ABNORMAL LOW (ref 36.0–46.0)
Hemoglobin: 11.2 g/dL — ABNORMAL LOW (ref 12.0–15.0)
MCH: 28.8 pg (ref 26.0–34.0)
MCHC: 32 g/dL (ref 30.0–36.0)
MCV: 90 fL (ref 80.0–100.0)
Platelets: 233 10*3/uL (ref 150–400)
RBC: 3.89 MIL/uL (ref 3.87–5.11)
RDW: 14.6 % (ref 11.5–15.5)
WBC: 13.7 10*3/uL — ABNORMAL HIGH (ref 4.0–10.5)
nRBC: 0.1 % (ref 0.0–0.2)

## 2021-03-05 LAB — BASIC METABOLIC PANEL
Anion gap: 12 (ref 5–15)
BUN: 25 mg/dL — ABNORMAL HIGH (ref 8–23)
CO2: 22 mmol/L (ref 22–32)
Calcium: 9.3 mg/dL (ref 8.9–10.3)
Chloride: 99 mmol/L (ref 98–111)
Creatinine, Ser: 1.32 mg/dL — ABNORMAL HIGH (ref 0.44–1.00)
GFR, Estimated: 38 mL/min — ABNORMAL LOW (ref >60)
Glucose, Bld: 118 mg/dL — ABNORMAL HIGH (ref 70–99)
Potassium: 3.9 mmol/L (ref 3.5–5.1)
Sodium: 133 mmol/L — ABNORMAL LOW (ref 135–145)

## 2021-03-05 LAB — MAGNESIUM: Magnesium: 2 mg/dL (ref 1.7–2.4)

## 2021-03-05 LAB — BRAIN NATRIURETIC PEPTIDE: B Natriuretic Peptide: 1232.5 pg/mL — ABNORMAL HIGH (ref 0.0–100.0)

## 2021-03-05 LAB — HEPATIC FUNCTION PANEL
ALT: 11 U/L (ref 0–44)
AST: 24 U/L (ref 15–41)
Albumin: 3.6 g/dL (ref 3.5–5.0)
Alkaline Phosphatase: 61 U/L (ref 38–126)
Bilirubin, Direct: 0.3 mg/dL — ABNORMAL HIGH (ref 0.0–0.2)
Indirect Bilirubin: 1.2 mg/dL — ABNORMAL HIGH (ref 0.3–0.9)
Total Bilirubin: 1.5 mg/dL — ABNORMAL HIGH (ref 0.3–1.2)
Total Protein: 6.5 g/dL (ref 6.5–8.1)

## 2021-03-05 LAB — RESP PANEL BY RT-PCR (FLU A&B, COVID) ARPGX2
Influenza A by PCR: NEGATIVE
Influenza B by PCR: NEGATIVE
SARS Coronavirus 2 by RT PCR: NEGATIVE

## 2021-03-05 LAB — TSH: TSH: 2.162 u[IU]/mL (ref 0.350–4.500)

## 2021-03-05 MED ORDER — SODIUM CHLORIDE 0.9 % IV BOLUS: 500.0000 mL | Freq: Once | INTRAVENOUS | Status: DC

## 2021-03-05 MED ORDER — LATANOPROST 0.005 % OP SOLN
2.0000 [drp] | Freq: Every day | OPHTHALMIC | Status: DC
  Administered 2021-03-05 – 2021-03-10 (×6): 2 [drp] via OPHTHALMIC
  Filled 2021-03-05 (×2): qty 2.5

## 2021-03-05 MED ORDER — LEVOTHYROXINE SODIUM 75 MCG PO TABS
75.0000 ug | ORAL_TABLET | Freq: Every day | ORAL | Status: DC
  Administered 2021-03-06 – 2021-03-11 (×6): 75 ug via ORAL
  Filled 2021-03-05 (×6): qty 1

## 2021-03-05 MED ORDER — HEPARIN SODIUM (PORCINE) 5000 UNIT/ML IJ SOLN
5000.0000 [IU] | Freq: Three times a day (TID) | INTRAMUSCULAR | Status: DC
  Administered 2021-03-05 – 2021-03-06 (×2): 5000 [IU] via SUBCUTANEOUS
  Filled 2021-03-05 (×2): qty 1

## 2021-03-05 MED ORDER — SODIUM CHLORIDE 0.9 % IV SOLN
500.0000 mg | Freq: Once | INTRAVENOUS | Status: AC
  Administered 2021-03-05: 500 mg via INTRAVENOUS
  Filled 2021-03-05: qty 500

## 2021-03-05 MED ORDER — ISOSORBIDE MONONITRATE ER 60 MG PO TB24
60.0000 mg | ORAL_TABLET | Freq: Every day | ORAL | Status: DC
  Administered 2021-03-05 – 2021-03-08 (×4): 60 mg via ORAL
  Filled 2021-03-05: qty 1
  Filled 2021-03-05: qty 2
  Filled 2021-03-05: qty 1
  Filled 2021-03-05: qty 2

## 2021-03-05 MED ORDER — NITROGLYCERIN 0.4 MG SL SUBL
0.4000 mg | SUBLINGUAL_TABLET | SUBLINGUAL | Status: DC | PRN
  Administered 2021-03-08 – 2021-03-09 (×3): 0.4 mg via SUBLINGUAL
  Filled 2021-03-05 (×3): qty 1

## 2021-03-05 MED ORDER — ATORVASTATIN CALCIUM 10 MG PO TABS
20.0000 mg | ORAL_TABLET | Freq: Every day | ORAL | Status: DC
  Administered 2021-03-05 – 2021-03-11 (×6): 20 mg via ORAL
  Filled 2021-03-05 (×7): qty 2

## 2021-03-05 MED ORDER — DORZOLAMIDE HCL 2 % OP SOLN
2.0000 [drp] | Freq: Two times a day (BID) | OPHTHALMIC | Status: DC
  Administered 2021-03-05 – 2021-03-11 (×12): 2 [drp] via OPHTHALMIC
  Filled 2021-03-05 (×2): qty 10

## 2021-03-05 MED ORDER — ASPIRIN EC 81 MG PO TBEC
81.0000 mg | DELAYED_RELEASE_TABLET | Freq: Every day | ORAL | Status: DC
  Administered 2021-03-06 – 2021-03-10 (×4): 81 mg via ORAL
  Filled 2021-03-05 (×5): qty 1

## 2021-03-05 MED ORDER — FUROSEMIDE 10 MG/ML IJ SOLN
40.0000 mg | Freq: Once | INTRAMUSCULAR | Status: AC
  Administered 2021-03-05: 40 mg via INTRAVENOUS
  Filled 2021-03-05: qty 4

## 2021-03-05 MED ORDER — METOPROLOL SUCCINATE ER 25 MG PO TB24
12.5000 mg | ORAL_TABLET | Freq: Every day | ORAL | Status: DC
  Administered 2021-03-06: 12.5 mg via ORAL
  Filled 2021-03-05: qty 1

## 2021-03-05 MED ORDER — SODIUM CHLORIDE 0.9 % IV SOLN
1.0000 g | Freq: Once | INTRAVENOUS | Status: AC
  Administered 2021-03-05: 1 g via INTRAVENOUS
  Filled 2021-03-05: qty 10

## 2021-03-05 MED ORDER — ACETAMINOPHEN 500 MG PO TABS
500.0000 mg | ORAL_TABLET | Freq: Four times a day (QID) | ORAL | Status: DC | PRN
  Administered 2021-03-05 – 2021-03-11 (×6): 500 mg via ORAL
  Filled 2021-03-05 (×6): qty 1

## 2021-03-05 MED ORDER — LOSARTAN POTASSIUM 50 MG PO TABS: 50.0000 mg | ORAL_TABLET | Freq: Every day | ORAL | Status: DC

## 2021-03-05 MED ORDER — MORPHINE SULFATE (PF) 2 MG/ML IV SOLN
2.0000 mg | Freq: Once | INTRAVENOUS | Status: AC
  Administered 2021-03-05: 2 mg via INTRAVENOUS
  Filled 2021-03-05: qty 1

## 2021-03-05 NOTE — ED Triage Notes (Signed)
Patient arrived with EMS from home reports intermittent central chest pain with mild SOB onset yesterday , no emesis or diaphoresis , she received 2 NTG sl and ASA 324 mg prior to arrival . She has difficulty hearing .

## 2021-03-05 NOTE — H&P (Addendum)
[facility] Admission History and Physical Service Pager: [phone]  Patient name: Candace Ross Medical record number: [id number] Date of birth: 1926-06-10 Age: 87 y.o. Gender: female  Primary Care Provider: Buzzy Han, MD Consultants: Cardiology Code Status: Full   Preferred Emergency Contact: Remo Lipps (brother) [phone]  Chief Complaint: chest pain and SOB   Assessment and Plan: Candace Ross is a 87 y.o. female presenting with chest pain and dyspnea. PMH is significant for CAD s/p PCI in 2016, HFpEF, mitral regurgitation, LBBB,  HTN ,spinal stenosis w/ claudication, CKD stage III, Hypothyroidism,  scoliosis and breast cancer.  Acute on Chronic CHF Exacerbation  HFpEF  Last echo in 10/06/20 showed EF 55-60%. Patient arrived to the ED after 2-day history of non exertional substernal chest pain and dyspnea. Initial lab in the ED showed significantly elevated BNP 1232.5 compared to prior result of 286 from 2 years ago. Other labs include troponin 20, Tbil 1.5, bun 25, GFR 38, Na 133, K 3.9, WBC 13.7, Hgb 11.2 and normal liver enzymes. EKG showed sinus rhythm with LBBB. CXR showed bibasilar opacities demonstrating atelectasis or consolidation and diffused peribronchial cuffing suggestive of chronic bronchitis but no signs of pulmonary vascular congestion or pulmonary edema. CXR findings and slightly elevated WBC 13.7 are concerning for pneumonia although less likely considering patient is afebrile, has non productive cough and denies any illness. On exam patient had mild diffused crackles but no JVD or LE edema.  In the ED she was placed on 1L [place] and sating in the mid 90s. She also received IV lasix 40 mg, ceftriaxone 1g x2, azithromycin 500 mg x1 and 2 mg morphine. -Admitted to [facility], Dr. McDiarmid attending -Cardiology following, appreciate recs  -Repeat ECHO  -Continue home medication of Aspirin 81 mg  -Continuous Cardiorespiratory  Monitoring -R/A diuretic dosing in am -Tylenol 500mg  Q6H PRN -Imdur 60mg  daily  -Strict Is/Os -Daily weight  -Routine vital -Consider Palliative consult to discuss [place]   Chest pain Patient said her chest pain started in 2016 after her stent placement.She have nonexertional chest pain and SOB that started simultaneously sometime on Thursday. On Friday pain continued and didn't not resolve after taking Aspirin and 3 sublingual nitroglycerin tablet prompting her to call EMS. CXR show no signs of pulmonary vascular congestion or pulmonary edema. Low suspicion for ACS considering low troponin 20 on admission. Cardiology following and recommend trending troponin and considering nitroglycerin drip if pain progressively worsens.   -Cardiology following, appreciate recs -Continue home medication of Aspirin 81 mg  - Continuous Cardiorespiratory Monitoring -Tylenol 500mg  Q6H PRN --Continue sublingual nitroglycerine    CAD s/p PCI 2016 DES prox/mid circumflex, mid RCA s/p PTCA  Patient with history of CAD with stent placement in 2016. Her troponin on admission was 20.  She is currently on on home medication of Aspirin, artovastatin and sublingual nitroglycerine. -Continue home medication of Aspirin 81 mg  -Continue home medication of Atorvastatin 20 mg -Continue sublingual nitroglycerine   Hypertension  Chronic and stable. She takes home medication of Losartan 50mg  at bedtime, and Metoprolol 25mg  daily  -Continue home medication of Losartan 50 mg -Continue home medication of metoprolol 25mg  daily  Mitral regurgitation  Patient has history of degenerative and calcified mitral valve with mild to moderate prolapse consistent with grade III mitral regurgitation. -Echo ordered and will follow up  CKD stage III Patient has history of stage III CKD. Her most recent Creatinine was 2 years ago 1.01. CMP was ordered and is pending.  Will follow up with the lab and monitor creatinine closely with Lasix  treatment for diuresis. -Follow-up CMP  -Avoid Nephrotoxic agents   Hx of Breast Cancer -Was diagnosed with left breast invasive ductal Carcinoma in 2013 treated with needle localized lumpectomy.    FEN/GI: Heart Healthy Prophylaxis: Heparin  Disposition: med tele  History of Present Illness:  RIELLY Ross is a 87 y.o. female presenting with dyspnea and chest pain.  Patient reports having chronic chest pain that started in 2016 after stent placement. On Thursday (10/06) she started having chest pain with SOB. She has non exertional substernal chest pain and SOB. Pain got worse on Friday and did not improve after she took 3 nitroglycerin tablet prompting her to call the EMS. She denies having any sick contact, recent illness, lower extremity edema or PND.  She report her cough to be chronic and mostly dry but occasionally have clear sputum. Patient said her chest pain improved after she received morphine in the ED.   Review Of Systems:   Review of Systems  Constitutional:  Negative for chills, diaphoresis and fever.  Eyes:  Negative for blurred vision, double vision and pain.  Respiratory:  Positive for cough, sputum production and shortness of breath. Negative for hemoptysis and wheezing.   Cardiovascular:  Positive for chest pain. Negative for palpitations, orthopnea, claudication, leg swelling and PND.  Gastrointestinal:  Negative for blood in stool, constipation, diarrhea, heartburn and vomiting.  Neurological:  Negative for tingling, tremors, focal weakness and headaches.    Patient Active Problem List   Diagnosis Date Noted   Dyspnea 03/05/2021   Acute on chronic heart failure with preserved ejection fraction (HFpEF) (HCC)    Atherosclerosis of native coronary artery of native heart with stable angina pectoris ([place])    History of coronary angioplasty with insertion of stent    Severe mitral regurgitation    LBBB (left bundle branch block)    Asymptomatic bilateral  carotid artery stenosis    Hyponatremia    Leukocytosis    Hypertensive heart and kidney disease with HF and with CKD stage III ([place])    Mixed hyperlipidemia    Acute on chronic diastolic heart failure ([place]) 11/29/2018   Benign hypertension 11/29/2018   Shortness of breath 11/29/2018   Acute on chronic diastolic CHF (congestive heart failure) ([place]) 11/29/2018   Spinal stenosis of lumbar region with neurogenic claudication 01/23/2017   Scoliosis of thoracolumbar spine 01/23/2017   Lumbar radiculopathy 01/23/2017   CAD (coronary artery disease), native coronary artery 12/18/2014   Angina pectoris ([place]) 12/17/2014   Cancer of central portion of female breast ([place]) 10/20/2011    Past Medical History: Past Medical History:  Diagnosis Date   Anemia    Back pain    Breast cancer ([place])    Change in stool habits    Dribbling urine    H/O bone density study 2009   Hearing loss    Hypertension    Osteoporosis    Sinus problem    SOB (shortness of breath) on exertion    stairs   Spinal stenosis    Thyroid disease    Wears dentures     Past Surgical History: Past Surgical History:  Procedure Laterality Date   BARTHOLIN GLAND CYST EXCISION  1969   BREAST LUMPECTOMY  05/02/77   benign   CARDIAC CATHETERIZATION N/A 12/18/2014   Procedure: Left Heart Cath and Coronary Angiography;  Surgeon: Adrian Prows, MD;  Location: [facility];  Service:  Cardiovascular;  Laterality: N/A;   CARDIAC CATHETERIZATION N/A 12/18/2014   Procedure: Coronary Stent Intervention;  Surgeon: Adrian Prows, MD;  Location: [facility];  Service: Cardiovascular;  Laterality: N/A;   HERNIA REPAIR  12/15/99    Social History: Social History   Tobacco Use   Smoking status: Never   Smokeless tobacco: Never  Vaping Use   Vaping Use: Never used  Substance Use Topics   Alcohol use: No   Drug use: No    Please also refer to relevant sections of EMR.  Family History: Family History  Problem Relation  Age of Onset   Pneumonia Father    Colon cancer Brother     Allergies and Medications: Allergies  Allergen Reactions   Penicillins Other (See Comments)    NOT Allergy to penicillin  Allergic to FILLER ONLY  Non active ingredient   Other Other (See Comments)    "some medicine they gave me in the hospital, it was a little purple sleeping  pill, to help me sleep didn't work; it made me mean"   No current facility-administered medications on file prior to encounter.   Current Outpatient Medications on File Prior to Encounter  Medication Sig Dispense Refill   acetaminophen (TYLENOL) 500 MG tablet Take 500 mg by mouth every 6 (six) hours as needed for headache (pain).      aspirin 81 MG EC tablet Take 81 mg by mouth daily after breakfast.      atorvastatin (LIPITOR) 20 MG tablet TAKE 1 TABLET EVERY DAY (Patient taking differently: Take 20 mg by mouth at bedtime.) 90 tablet 1   Cholecalciferol (VITAMIN D3 PO) Take 1 tablet by mouth daily.     Cyanocobalamin (VITAMIN B-12 PO) Take 1 tablet by mouth once a week.      dorzolamide (TRUSOPT) 2 % ophthalmic solution Place 2 drops into both eyes 2 (two) times daily.      Ferrous Sulfate (IRON PO) Take 1 tablet by mouth once a week.      furosemide (LASIX) 20 MG tablet Take 1 tablet (20 mg total) by mouth 2 (two) times daily. Take additional tab once daily for shortness of breath 220 tablet 3   isosorbide mononitrate (IMDUR) 60 MG 24 hr tablet Take 1 tablet (60 mg total) by mouth daily. 90 tablet 1   latanoprost (XALATAN) 0.005 % ophthalmic solution Place 2 drops into both eyes at bedtime.      levothyroxine (SYNTHROID) 75 MCG tablet Take 75 mcg by mouth daily before breakfast.     losartan (COZAAR) 50 MG tablet Take 1 tablet (50 mg total) by mouth at bedtime.     MAGNESIUM PO Take 1 tablet by mouth every hour as needed (supplement).     metoprolol succinate (TOPROL-XL) 25 MG 24 hr tablet Take 1 tablet (25 mg total) by mouth daily. (Patient taking  differently: Take 25 mg by mouth 2 (two) times daily as needed (low blood pressure).)     nitroGLYCERIN (NITROSTAT) 0.4 MG SL tablet Place 1 tablet (0.4 mg total) under the tongue every 5 (five) minutes as needed for chest pain. 90 tablet 3   potassium chloride (KLOR-CON) 8 MEQ tablet TAKE 1 TABLET TWICE DAILY WITH FUROSEMIDE (Patient taking differently: Take 8 mEq by mouth 2 (two) times daily.) 180 tablet 2   VITAMIN E PO Take 1 capsule by mouth every other day.       Objective: BP 119/62   Pulse 93   Temp 97.6 F (36.4  C) (Temporal)   Resp 15   Ht 4\' 8"  (1.422 m)   Wt 44.5 kg   SpO2 96%   BMI 21.97 kg/m  Exam: General: Awake, Laying in bed, well appearing , NAD HEENT: No sclera icterus, Moist Mucous, Neck: No JVD, full ROM Cardiovascular: RRR, 2/6 holosystolic murmur,  Respiratory: CTAB, diffused mild crackles, Normal WOB on 2L [place] Gastrointestinal: Soft, no distension, no tenderness MSK: Normal muscle tone, no edema on LE bilaterally, 5/5 muscle strength on all extremities. +2 pedal and radial pulse Derm: dry, warm. Well perfused  Neuro: oriented x3, no focal deficit Psych: Normal affect, well appearing, communicate appropriately  Labs and Imaging: CBC BMET  Recent Labs  Lab 03/05/21 0502  WBC 13.7*  HGB 11.2*  HCT 35.0*  PLT 233   Recent Labs  Lab 03/05/21 0502  NA 133*  K 3.9  CL 99  CO2 22  BUN 25*  CREATININE 1.32*  GLUCOSE 118*  CALCIUM 9.3     EKG: Sinus rhythm with LBBB  DG Chest 2 View  Result Date: 03/05/2021 CLINICAL DATA:  87 year old female with history of chest pain. EXAM: CHEST - 2 VIEW COMPARISON:  Chest x-ray 11/29/2018. FINDINGS: Lung volumes are low. Diffuse peribronchial cuffing and widespread interstitial prominence, similar to numerous prior examinations. There are some bibasilar opacities on today's examination which may reflect areas of atelectasis and/or consolidation. No definite pleural effusions. No pneumothorax. No evidence of  pulmonary edema. Heart size is normal. Large hiatal hernia. Upper mediastinal contours are within normal limits. Atherosclerotic calcifications in the thoracic aorta. IMPRESSION: 1. Low lung volumes with bibasilar areas atelectasis and/or consolidation. 2. Diffuse peribronchial cuffing and widespread interstitial prominence, similar to prior studies, suggesting chronic bronchitis. 3. Aortic atherosclerosis. 4. Large hiatal hernia. Electronically Signed   By: Vinnie Langton M.D.   On: 03/05/2021 06:01     Alen Bleacher, MD 03/05/2021, 7:35 PM PGY-1, [facility] Intern pager: [phone], text pages welcome   FPTS Upper-Level Resident Addendum   I have independently interviewed and examined the patient. I have discussed the above with the original author and agree with their documentation. Please see also any attending notes.   Alcus Dad PGY-3, [facility] 03/05/2021 8:49 PM  [place] Service pager: [phone] (text pages welcome through [facility])

## 2021-03-05 NOTE — ED Notes (Signed)
Hospitalists at bedside.  

## 2021-03-05 NOTE — ED Notes (Signed)
Brother Wendall Mola [phone] would like an update

## 2021-03-05 NOTE — ED Provider Notes (Signed)
Emergency Medicine Provider Triage Evaluation Note  Candace Ross , a 87 y.o. female  was evaluated in triage.  Pt complains of chest pain since Thursday night.  History of angina states that she has been taking nitroglycerin with some improvement seems that her symptoms have been intermittent but became constant yesterday.  Some shortness of breath.  No diaphoresis no nausea or vomiting.  She received nitroglycerin x2 and states that her chest pain improved dramatically but is still somewhat present.  She also received full dose aspirin.  Review of Systems  Positive: Chest pain, SOB Negative: Fever  Physical Exam  BP (!) 141/64 (BP Location: Left Arm)   Pulse 84   Temp 98.7 F (37.1 C) (Oral)   Resp 16   SpO2 98%  Gen:   Awake, no distress   Resp:  Normal effort  MSK:   Moves extremities without difficulty  Other:  No lower extremity swelling.  Medical Decision Making  Medically screening exam initiated at 4:59 AM.  Appropriate orders placed.  [name] was informed that the remainder of the evaluation will be completed by another provider, this initial triage assessment does not replace that evaluation, and the importance of remaining in the ED until their evaluation is complete.  Patient had chest pain history of similar seems to have had more persistent chest pain over the past 12 hours or so.  Improved with nitroglycerin but not resolved.   Pati Gallo [place], [place] 03/05/21 0515    Fatima Blank, MD 03/06/21 [phone]

## 2021-03-05 NOTE — Consult Note (Signed)
CARDIOLOGY CONSULT NOTE  Patient ID: Candace Ross MRN: [id number] DOB/AGE: 1927-01-18 87 y.o.  Admit date: 03/05/2021 Attending physician: Carmin Muskrat, MD Primary Physician:  Buzzy Han, MD Outpatient Cardiologist: Dr. Adrian Prows Inpatient Cardiologist: Rex Kras, DO, [facility]  Reason of consultation: Congestive heart failure Referring physician: Carmin Muskrat, MD  Chief complaint: Chest pain and shortness of breath  HPI:  Candace Ross is a 87 y.o. Caucasian female who presents with a chief complaint of " chest pain and shortness of breath." Her past medical history and cardiovascular risk factors include: Coronary artery disease status post PCI proximal/mid LCx and RCA July 2016, history of HFpEF, severe mitral regurgitation, hypertension, hyperlipidemia, asymptomatic carotid artery stenosis, postmenopausal female, advanced age.  Patient presents to the hospital with a chief complaint of chest pain and shortness of breath.  Patient states that she has chronic chest pain consistent with stable angina for which she takes sublingual nitroglycerin tablets on as needed basis.  However since Thursday, 03/03/2021, she has been having chest pain and shortness of breath both which have come on simultaneously.  She been taking her medications as prescribed, no sick contacts, blood pressure and other vital signs have remained stable.  She decides to come to the hospital as the chest pain was not being resolved despite taking 3 sublingual nitroglycerin tablets.  And she was also experiencing shortness of breath.  The chest pain is located substernally, not able to comment on if it is exertionally related (poor functional status), resolves with either nitroglycerin tablets or resting.  Shortness of breath at rest and with effort related activities.  She denies orthopnea, paroxysmal nocturnal dyspnea or lower extremity swelling.  Cardiology was consulted by the ED physician today  to help evaluate and manage her chest pain and shortness of breath.  Work-up in the ED notes sinus rhythm on EKG without underlying injury pattern, troponins essentially flat not suggestive of ACS, elevated BNP levels.  She also has elevated WBC count and liver function which is currently being managed by primary team.  ALLERGIES: Allergies  Allergen Reactions   Penicillins Other (See Comments)    NOT Allergy to penicillin  Allergic to FILLER ONLY  Non active ingredient   Other Other (See Comments)    "some medicine they gave me in the hospital, it was a little purple sleeping  pill, to help me sleep didn't work; it made me mean"    PAST MEDICAL HISTORY: Past Medical History:  Diagnosis Date   Anemia    Back pain    Breast cancer ([place])    Change in stool habits    Dribbling urine    H/O bone density study 2009   Hearing loss    Hypertension    Osteoporosis    Sinus problem    SOB (shortness of breath) on exertion    stairs   Spinal stenosis    Thyroid disease    Wears dentures     PAST SURGICAL HISTORY: Past Surgical History:  Procedure Laterality Date   BARTHOLIN GLAND CYST EXCISION  1969   BREAST LUMPECTOMY  05/02/77   benign   CARDIAC CATHETERIZATION N/A 12/18/2014   Procedure: Left Heart Cath and Coronary Angiography;  Surgeon: Adrian Prows, MD;  Location: [place] [facility];  Service: Cardiovascular;  Laterality: N/A;   CARDIAC CATHETERIZATION N/A 12/18/2014   Procedure: Coronary Stent Intervention;  Surgeon: Adrian Prows, MD;  Location: [place] [facility];  Service: Cardiovascular;  Laterality: N/A;   HERNIA REPAIR  12/15/99    FAMILY HISTORY: The patient family history includes Colon cancer in her brother; Pneumonia in her father.   SOCIAL HISTORY:  The patient  reports that she has never smoked. She has never used smokeless tobacco. She reports that she does not drink alcohol and does not use drugs.  MEDICATIONS: Current Outpatient Medications  Medication  Instructions   acetaminophen (TYLENOL) 500 mg, Oral, Every 6 hours PRN   aspirin 81 mg, Oral, Daily after breakfast   atorvastatin (LIPITOR) 20 MG tablet TAKE 1 TABLET EVERY DAY   Cholecalciferol (VITAMIN D3 PO) 1 tablet, Oral, Daily   Cyanocobalamin (VITAMIN B-12 PO) 1 tablet, Oral, Weekly   dorzolamide (TRUSOPT) 2 % ophthalmic solution 2 drops, Both Eyes, 2 times daily   Ferrous Sulfate (IRON PO) 1 tablet, Oral, Weekly   furosemide (LASIX) 20 mg, Oral, 2 times daily, Take additional tab once daily for shortness of breath   isosorbide mononitrate (IMDUR) 60 mg, Oral, Daily   latanoprost (XALATAN) 0.005 % ophthalmic solution 2 drops, Both Eyes, Daily at bedtime   levothyroxine (SYNTHROID) 75 mcg, Oral, Daily before breakfast   losartan (COZAAR) 50 mg, Oral, Daily at bedtime   MAGNESIUM PO 1 tablet, Oral, Every 1 hour PRN   metoprolol succinate (TOPROL-XL) 25 mg, Oral, Daily   nitroGLYCERIN (NITROSTAT) 0.4 mg, Sublingual, Every 5 min PRN   potassium chloride (KLOR-CON) 8 MEQ tablet TAKE 1 TABLET TWICE DAILY WITH FUROSEMIDE   VITAMIN E PO 1 capsule, Oral, Every other day    REVIEW OF SYSTEMS: Review of Systems  Constitutional: Negative for chills and fever.  HENT:  Negative for hoarse voice and nosebleeds.        Hard of hearing  Eyes:  Negative for discharge, double vision and pain.  Cardiovascular:  Positive for chest pain and dyspnea on exertion. Negative for claudication, leg swelling, near-syncope, orthopnea, palpitations, paroxysmal nocturnal dyspnea and syncope.  Respiratory:  Positive for shortness of breath. Negative for hemoptysis.   Musculoskeletal:  Negative for muscle cramps and myalgias.  Gastrointestinal:  Negative for abdominal pain, constipation, diarrhea, hematemesis, hematochezia, melena, nausea and vomiting.  Neurological:  Negative for dizziness and light-headedness.  Psychiatric/Behavioral:  Negative for altered mental status, hallucinations and suicidal ideas.    All other systems reviewed and are negative.  PHYSICAL EXAM: Vitals with BMI 03/05/2021 03/05/2021 03/05/2021  Height - - -  Weight - - -  BMI - - -  Systolic 147 829 -  Diastolic 57 54 -  Pulse 88 88 87    No intake or output data in the 24 hours ending 03/05/21 1659  Net IO Since Admission: No IO data has been entered for this period [03/05/21 1659]  CONSTITUTIONAL: Appears older than stated age, frail, hemodynamically stable, no acute distress.  SKIN: Skin is warm and dry. No rash noted. No cyanosis. No pallor. No jaundice HEAD: Normocephalic and atraumatic.  EYES: No scleral icterus MOUTH/THROAT: Moist oral membranes.  NECK: No JVD present. No thyromegaly noted.  Bilateral carotid bruits (left >right) LYMPHATIC: No visible cervical adenopathy.  CHEST Normal respiratory effort. No intercostal retractions  LUNGS: Clear to auscultation bilaterally.  No stridor. No wheezes. No rales.  CARDIOVASCULAR: Regular, positive F6-O1, holosystolic murmur heard at the apex radiating to the axilla, no gallops or rubs ABDOMINAL: Soft, nontender, nondistended, positive bowel sounds in all 4 quadrants, no apparent ascites.  EXTREMITIES: No peripheral edema, warm to touch HEMATOLOGIC: No significant bruising NEUROLOGIC: Oriented to person, place, and time. Nonfocal. Normal  muscle tone.  PSYCHIATRIC: Normal mood and affect. Normal behavior. Cooperative  RADIOLOGY: DG Chest 2 View  Result Date: 03/05/2021 CLINICAL DATA:  87 year old female with history of chest pain. EXAM: CHEST - 2 VIEW COMPARISON:  Chest x-ray 11/29/2018. FINDINGS: Lung volumes are low. Diffuse peribronchial cuffing and widespread interstitial prominence, similar to numerous prior examinations. There are some bibasilar opacities on today's examination which may reflect areas of atelectasis and/or consolidation. No definite pleural effusions. No pneumothorax. No evidence of pulmonary edema. Heart size is normal. Large hiatal hernia.  Upper mediastinal contours are within normal limits. Atherosclerotic calcifications in the thoracic aorta. IMPRESSION: 1. Low lung volumes with bibasilar areas atelectasis and/or consolidation. 2. Diffuse peribronchial cuffing and widespread interstitial prominence, similar to prior studies, suggesting chronic bronchitis. 3. Aortic atherosclerosis. 4. Large hiatal hernia. Electronically Signed   By: Vinnie Langton M.D.   On: 03/05/2021 06:01    LABORATORY DATA: Lab Results  Component Value Date   WBC 13.7 (H) 03/05/2021   HGB 11.2 (L) 03/05/2021   HCT 35.0 (L) 03/05/2021   MCV 90.0 03/05/2021   PLT 233 03/05/2021    Recent Labs  Lab 03/05/21 0502 03/05/21 1246  NA 133*  --   K 3.9  --   CL 99  --   CO2 22  --   BUN 25*  --   CREATININE 1.32*  --   CALCIUM 9.3  --   PROT  --  6.5  BILITOT  --  1.5*  ALKPHOS  --  61  ALT  --  11  AST  --  24  GLUCOSE 118*  --     Lipid Panel  Lab Results  Component Value Date   CHOL 157 05/06/2020   HDL 65 05/06/2020   LDLCALC 69 05/06/2020   TRIG 136 05/06/2020   CHOLHDL 3.4 01/22/2016    BNP (last 3 results) Recent Labs    03/05/21 0502  BNP 1,232.5*    HEMOGLOBIN A1C No results found for: HGBA1C, MPG  Cardiac Panel (last 3 results) Recent Labs    03/05/21 0502 03/05/21 1246  TROPONINIHS 20* 19*     TSH No results for input(s): TSH in the last 8760 hours.   CARDIAC DATABASE: EKG: 03/05/2021: Normal sinus rhythm, 78 bpm, LAE, left axis, left bundle branch block, ST-T changes most likely secondary to LBBB.  Echocardiogram: 10/06/2020: Normal LV systolic function with visual EF 55-60%. Left ventricle cavity is normal in size. Normal global wall motion. Basal septal hypertrophy. Diastolic dysfunction not accurate due to MAC but doppler would be consistent with grade III restrictive filling pressure and elevated LAP.   Left atrial cavity is severely dilated.  Mild to moderate aortic regurgitation. Degenerative and  calcific mitral valve with subvalvular calcification. Mild to moderate prolapse, Eccentric jet wraps along the IAS consistent with severe (Grade III) mitral regurgitation. Moderate tricuspid valve regurgitation. Moderate pulmonary hypertension. RVSP measures 67 mmHg. Mild pulmonic regurgitation. IVC is dilated with a respiratory response of <50%. Compared to prior study dated 11/30/2018: LAE was moderately dilated now severe otherwise no significant change.   Stress Testing:  Myocardial perfusion imaging 01/23/2016: 1. There is a large fixed defect involving the mid and distal anterior wall, anterior septum and anterior lateral wall. No reversibility identified.   2. There is hypokinesis involving the anterior septum, anterior wall and anterior lateral wall which corresponds to the large fixed defect.   3. Left ventricular ejection fraction 60%   4. Non invasive risk stratification*:  low  Heart Catheterization: 12/18/2014: RPDA lesion, 70% stenosed. PTCA and stenting of the proximal and mid circumflex with a 3.0 x 38 mm resolute integrity DES for 90% stenosis, stenting of the mid RCA with a 2.75 x 26 mm resolute integrity DES for 99% stenosis, both reduced to 0%. LM lesion, 10% stenosed. LVEF 45% with mid to distal anterolateral akinesis, inferolateral mild hypokinesis.  IMPRESSION & RECOMMENDATIONS: Candace Ross is a 87 y.o. Caucasian female whose past medical history and cardiovascular risk factors include: Coronary artery disease status post PCI proximal/mid LCx and RCA July 2016, history of HFpEF, severe mitral regurgitation, hypertension, hyperlipidemia, asymptomatic carotid artery stenosis, postmenopausal female, advanced age.  Primary Diagnosis:  Acute on chronic exacerbation of HFpEF EF, stage C, NYHA class III Most likely secondary to underlying valvular heart disease. BNP on admission 1232 Chest x-ray: No significant pulmonary vascular congestion On clinical  examination she is not in overt heart failure but is currently being admitted to the hospital due to hypoxia requiring nasal cannula oxygen support. Patient will need to be diuresed and based on kidney function hemodynamics may need further up titration of guideline directed medical therapy. Echocardiogram will be ordered to evaluate for structural heart disease and left ventricular systolic function. Patient was given Lasix 40 mg IV push in the ED. Strict I's and O's, Daily weights Continue telemetry Replacement of electrolytes as needed Monitor for now.  Established coronary disease with prior angioplasty and stenting with angina Patient underwent angioplasty and stenting in 2016 Continue aspirin and statin therapy. Continue sublingual nitroglycerin tablets. If chest pain resurfaces would recommend trending troponins, EKG, and if blood pressure allows nitro drip for symptom management Echocardiogram ordered. High sensitive troponins essentially flat, less likely to be ACS. EKG: Normal sinus with left bundle branch block  Severe mitral regurgitation: Echocardiogram ordered. Continue diuresis  Left bundle branch block: Chronic and stable  Asymptomatic carotid artery stenosis: Bilateral carotid bruits. Asymptomatic. Continue aspirin and statin therapy. Monitor for now  Secondary diagnoses: Hyponatremia. Leukocytosis Hypertension. Hyperlipidemia  Will defer the management of her acute problem list and secondary diagnoses to the primary team.    Total encounter time 87 minutes. *Total Encounter Time as defined by the Centers for Medicare and Medicaid Services includes, in addition to the face-to-face time of a patient visit (documented in the note above) non-face-to-face time: obtaining and reviewing outside history, ordering and reviewing medications, tests or procedures, care coordination (communications with referring/ED physician, currently being admitted to the family  medicine spoke to the residents) and documentation in the medical record.  Patient's questions and concerns were addressed to her satisfaction. She voices understanding of the instructions provided during this encounter.   This note was created using a voice recognition software as a result there may be grammatical errors inadvertently enclosed that do not reflect the nature of this encounter. Every attempt is made to correct such errors.  Mechele Claude [facility]  Pager: [phone] Office: [phone] 03/05/2021, 4:59 PM

## 2021-03-05 NOTE — ED Notes (Signed)
Lab to add BNP

## 2021-03-05 NOTE — ED Notes (Signed)
Updated pt's brother

## 2021-03-05 NOTE — ED Provider Notes (Signed)
[facility]ERGENCY DEPARTMENT Provider Note   CSN: [id number] Arrival date & time: 03/05/21  4132     History Chief Complaint  Patient presents with   Chest Pain    Candace Ross is a 87 y.o. female.  HPI Elderly female with multiple medical issues including valvular disease presents with chest pain.  Onset was possibly about 2 days ago, though this is unclear.  Since that time she has had tightness in the central chest with minimal associated dyspnea.  She is unaware of fever, has no nausea, no vomiting. No abdominal pain.  On there is associated weakness. No clear alleviating or exacerbating factors.     Past Medical History:  Diagnosis Date   Anemia    Back pain    Breast cancer ([place])    Change in stool habits    Dribbling urine    H/O bone density study 2009   Hearing loss    Hypertension    Osteoporosis    Sinus problem    SOB (shortness of breath) on exertion    stairs   Spinal stenosis    Thyroid disease    Wears dentures     Patient Active Problem List   Diagnosis Date Noted   Acute on chronic diastolic heart failure ([place]) 11/29/2018   Essential hypertension 11/29/2018   Shortness of breath 11/29/2018   Acute on chronic diastolic CHF (congestive heart failure) ([place]) 11/29/2018   Spinal stenosis of lumbar region with neurogenic claudication 01/23/2017   Scoliosis of thoracolumbar spine 01/23/2017   Lumbar radiculopathy 01/23/2017   CAD (coronary artery disease), native coronary artery 12/18/2014   Angina pectoris ([place]) 12/17/2014   Cancer of central portion of female breast ([place]) 10/20/2011    Past Surgical History:  Procedure Laterality Date   BARTHOLIN GLAND CYST EXCISION  1969   BREAST LUMPECTOMY  05/02/77   benign   CARDIAC CATHETERIZATION N/A 12/18/2014   Procedure: Left Heart Cath and Coronary Angiography;  Surgeon: Adrian Prows, MD;  Location: [facility];  Service: Cardiovascular;  Laterality: N/A;   CARDIAC  CATHETERIZATION N/A 12/18/2014   Procedure: Coronary Stent Intervention;  Surgeon: Adrian Prows, MD;  Location: [facility];  Service: Cardiovascular;  Laterality: N/A;   HERNIA REPAIR  12/15/99     OB History   No obstetric history on file.     Family History  Problem Relation Age of Onset   Pneumonia Father    Colon cancer Brother     Social History   Tobacco Use   Smoking status: Never   Smokeless tobacco: Never  Vaping Use   Vaping Use: Never used  Substance Use Topics   Alcohol use: No   Drug use: No    Home Medications Prior to Admission medications   Medication Sig Start Date End Date Taking? Authorizing Provider  acetaminophen (TYLENOL) 500 MG tablet Take 500 mg by mouth every 6 (six) hours as needed for headache (pain).     [provider]  aspirin 81 MG EC tablet Take 81 mg by mouth daily after breakfast.     [provider]  atorvastatin (LIPITOR) 20 MG tablet TAKE 1 TABLET EVERY DAY 05/13/20   Adrian Prows, MD  Cholecalciferol (VITAMIN D3 PO) Take 1 tablet by mouth daily.    [provider]  Cyanocobalamin (VITAMIN B-12 PO) Take 1 tablet by mouth once a week.     [provider]  dorzolamide (TRUSOPT) 2 % ophthalmic solution  Place 2 drops into both eyes 2 (two) times daily.     [provider]  Ferrous Sulfate (IRON PO) Take 1 tablet by mouth once a week.     [provider]  furosemide (LASIX) 20 MG tablet Take 1 tablet (20 mg total) by mouth 2 (two) times daily. Take additional tab once daily for shortness of breath 02/11/21   Adrian Prows, MD  isosorbide mononitrate (IMDUR) 60 MG 24 hr tablet Take 1 tablet (60 mg total) by mouth daily. 11/16/20   Adrian Prows, MD  latanoprost (XALATAN) 0.005 % ophthalmic solution Place 2 drops into both eyes at bedtime.     [provider]  levothyroxine (SYNTHROID) 75 MCG tablet Take 75 mcg by mouth daily before breakfast.    [provider]  losartan (COZAAR)  50 MG tablet Take 1 tablet (50 mg total) by mouth at bedtime. 12/12/18   Adrian Prows, MD  MAGNESIUM PO Take 1 tablet by mouth every hour as needed.     [provider]  metoprolol succinate (TOPROL-XL) 25 MG 24 hr tablet Take 1 tablet (25 mg total) by mouth daily. Patient taking differently: Take 25 mg by mouth 2 (two) times a day. 12/01/18   Adrian Prows, MD  nitroGLYCERIN (NITROSTAT) 0.4 MG SL tablet Place 1 tablet (0.4 mg total) under the tongue every 5 (five) minutes as needed for chest pain. 10/27/20   Adrian Prows, MD  potassium chloride (KLOR-CON) 8 MEQ tablet TAKE 1 TABLET TWICE DAILY WITH FUROSEMIDE 01/21/21   Adrian Prows, MD  VITAMIN E PO Take 1 capsule by mouth every other day.     [provider]    Allergies    Penicillins and Other  Review of Systems   Review of Systems  Constitutional:        Per HPI, otherwise negative  HENT:         Per HPI, otherwise negative  Respiratory:         Per HPI, otherwise negative  Cardiovascular:        Per HPI, otherwise negative  Gastrointestinal:  Negative for vomiting.  Endocrine:       Negative aside from HPI  Genitourinary:        Neg aside from HPI   Musculoskeletal:        Per HPI, otherwise negative  Skin: Negative.   Neurological:  Positive for weakness. Negative for syncope.   Physical Exam Updated Vital Signs BP (!) 123/58   Pulse 78   Temp 100.1 F (37.8 C) (Oral)   Resp (!) 22   Ht 4\' 8"  (1.422 m)   Wt 44.5 kg   SpO2 96%   BMI 21.97 kg/m   Physical Exam Vitals and nursing note reviewed.  Constitutional:      General: She is not in acute distress.    Appearance: She is well-developed.  HENT:     Head: Normocephalic and atraumatic.  Eyes:     Conjunctiva/sclera: Conjunctivae normal.  Cardiovascular:     Rate and Rhythm: Normal rate and regular rhythm.     Heart sounds: Murmur heard.  Pulmonary:     Effort: Pulmonary effort is normal. No respiratory distress.     Breath sounds: Normal breath  sounds. No stridor.  Abdominal:     General: There is no distension.  Skin:    General: Skin is warm and dry.  Neurological:     Mental Status: She is alert and oriented to person, place, and  time.     Cranial Nerves: No cranial nerve deficit.    ED Results / Procedures / Treatments   Labs (all labs ordered are listed, but only abnormal results are displayed) Labs Reviewed  BASIC METABOLIC PANEL - Abnormal; Notable for the following components:      Result Value   Sodium 133 (*)    Glucose, Bld 118 (*)    BUN 25 (*)    Creatinine, Ser 1.32 (*)    GFR, Estimated 38 (*)    All other components within normal limits  CBC - Abnormal; Notable for the following components:   WBC 13.7 (*)    Hemoglobin 11.2 (*)    HCT 35.0 (*)    All other components within normal limits  TROPONIN I (HIGH SENSITIVITY) - Abnormal; Notable for the following components:   Troponin I (High Sensitivity) 20 (*)    All other components within normal limits  PROTIME-INR  HEPATIC FUNCTION PANEL  BRAIN NATRIURETIC PEPTIDE  TROPONIN I (HIGH SENSITIVITY)    EKG EKG Interpretation  Date/Time:  Saturday March 05 2021 04:38:40 EDT Ventricular Rate:  78 PR Interval:  168 QRS Duration: 156 QT Interval:  412 QTC Calculation: 469 R Axis:   -77 Text Interpretation: Normal sinus rhythm Possible Left atrial enlargement Left axis deviation Left bundle branch block Abnormal ECG Confirmed by Carmin Muskrat [phone]) on 03/05/2021 11:43:00 AM  Radiology DG Chest 2 View  Result Date: 03/05/2021 CLINICAL DATA:  87 year old female with history of chest pain. EXAM: CHEST - 2 VIEW COMPARISON:  Chest x-ray 11/29/2018. FINDINGS: Lung volumes are low. Diffuse peribronchial cuffing and widespread interstitial prominence, similar to numerous prior examinations. There are some bibasilar opacities on today's examination which may reflect areas of atelectasis and/or consolidation. No definite pleural effusions. No pneumothorax.  No evidence of pulmonary edema. Heart size is normal. Large hiatal hernia. Upper mediastinal contours are within normal limits. Atherosclerotic calcifications in the thoracic aorta. IMPRESSION: 1. Low lung volumes with bibasilar areas atelectasis and/or consolidation. 2. Diffuse peribronchial cuffing and widespread interstitial prominence, similar to prior studies, suggesting chronic bronchitis. 3. Aortic atherosclerosis. 4. Large hiatal hernia. Electronically Signed   By: Vinnie Langton M.D.   On: 03/05/2021 06:01    Procedures Procedures   Medications Ordered in ED Medications  sodium chloride 0.9 % bolus 500 mL (has no administration in time range)    ED Course  I have reviewed the triage vital signs and the nursing notes.  Pertinent labs & imaging results that were available during my care of the patient were reviewed by me and considered in my medical decision making (see chart for details).  Cardiac 80s sinus normal Pulse ox 94 percent borderline 3:12 PM Patient now on 1 L via nasal cannula for appropriate saturation.  I discussed the patient's case with her cardiology team, they will follow as a consulting service given concern for possible concurrent infection, with elevated BNP/heart failure exacerbation.  Patient has received IV Lasix, will start antibiotics, will require admission for further monitoring, management.  MDM Rules/Calculators/A&P MDM Number of Diagnoses or Management Options Acute on chronic combined systolic and diastolic congestive heart failure ([place]): new, needed workup Community acquired pneumonia, unspecified laterality: new, needed workup   Amount and/or Complexity of Data Reviewed Clinical lab tests: ordered and reviewed Tests in the radiology section of CPT: ordered and reviewed Tests in the medicine section of CPT: reviewed and ordered Decide to obtain previous medical records or to obtain history from someone other  than the patient: yes Review  and summarize past medical records: yes Discuss the patient with other providers: yes Independent visualization of images, tracings, or specimens: yes  Risk of Complications, Morbidity, and/or Mortality Presenting problems: high Diagnostic procedures: high Management options: high  Critical Care Total time providing critical care: < 30 minutes  Patient Progress Patient progress: stable   Final Clinical Impression(s) / ED Diagnoses Final diagnoses:  Acute on chronic combined systolic and diastolic congestive heart failure ([place])  Community acquired pneumonia, unspecified laterality     Carmin Muskrat, MD 03/05/21 1514

## 2021-03-06 ENCOUNTER — Encounter (HOSPITAL_COMMUNITY): Payer: Self-pay | Admitting: Family Medicine

## 2021-03-06 ENCOUNTER — Other Ambulatory Visit: Payer: Self-pay | Admitting: Student

## 2021-03-06 ENCOUNTER — Inpatient Hospital Stay (HOSPITAL_COMMUNITY): Payer: Medicare HMO

## 2021-03-06 DIAGNOSIS — I340 Nonrheumatic mitral (valve) insufficiency: Secondary | ICD-10-CM | POA: Diagnosis not present

## 2021-03-06 DIAGNOSIS — R079 Chest pain, unspecified: Secondary | ICD-10-CM | POA: Diagnosis present

## 2021-03-06 DIAGNOSIS — J189 Pneumonia, unspecified organism: Secondary | ICD-10-CM | POA: Insufficient documentation

## 2021-03-06 DIAGNOSIS — I5033 Acute on chronic diastolic (congestive) heart failure: Secondary | ICD-10-CM | POA: Diagnosis not present

## 2021-03-06 DIAGNOSIS — I130 Hypertensive heart and chronic kidney disease with heart failure and stage 1 through stage 4 chronic kidney disease, or unspecified chronic kidney disease: Principal | ICD-10-CM

## 2021-03-06 DIAGNOSIS — I5032 Chronic diastolic (congestive) heart failure: Secondary | ICD-10-CM

## 2021-03-06 DIAGNOSIS — E871 Hypo-osmolality and hyponatremia: Secondary | ICD-10-CM

## 2021-03-06 DIAGNOSIS — N183 Chronic kidney disease, stage 3 unspecified: Secondary | ICD-10-CM

## 2021-03-06 DIAGNOSIS — D72829 Elevated white blood cell count, unspecified: Secondary | ICD-10-CM

## 2021-03-06 LAB — COMPREHENSIVE METABOLIC PANEL
ALT: 10 U/L (ref 0–44)
AST: 17 U/L (ref 15–41)
Albumin: 3.3 g/dL — ABNORMAL LOW (ref 3.5–5.0)
Alkaline Phosphatase: 56 U/L (ref 38–126)
Anion gap: 11 (ref 5–15)
BUN: 27 mg/dL — ABNORMAL HIGH (ref 8–23)
CO2: 22 mmol/L (ref 22–32)
Calcium: 9 mg/dL (ref 8.9–10.3)
Chloride: 98 mmol/L (ref 98–111)
Creatinine, Ser: 1.06 mg/dL — ABNORMAL HIGH (ref 0.44–1.00)
GFR, Estimated: 49 mL/min — ABNORMAL LOW (ref >60)
Glucose, Bld: 119 mg/dL — ABNORMAL HIGH (ref 70–99)
Potassium: 3.9 mmol/L (ref 3.5–5.1)
Sodium: 131 mmol/L — ABNORMAL LOW (ref 135–145)
Total Bilirubin: 1.8 mg/dL — ABNORMAL HIGH (ref 0.3–1.2)
Total Protein: 6.4 g/dL — ABNORMAL LOW (ref 6.5–8.1)

## 2021-03-06 LAB — ECHOCARDIOGRAM COMPLETE
Area-P 1/2: 4.49 cm2
Calc EF: 50.9 %
Height: 56 in
Radius: 0.7 cm
S' Lateral: 3.1 cm
Single Plane A2C EF: 54.2 %
Single Plane A4C EF: 46.1 %
Weight: 1568 oz

## 2021-03-06 LAB — CBC
HCT: 34.2 % — ABNORMAL LOW (ref 36.0–46.0)
Hemoglobin: 10.9 g/dL — ABNORMAL LOW (ref 12.0–15.0)
MCH: 28.9 pg (ref 26.0–34.0)
MCHC: 31.9 g/dL (ref 30.0–36.0)
MCV: 90.7 fL (ref 80.0–100.0)
Platelets: 177 10*3/uL (ref 150–400)
RBC: 3.77 MIL/uL — ABNORMAL LOW (ref 3.87–5.11)
RDW: 15 % (ref 11.5–15.5)
WBC: 15.5 10*3/uL — ABNORMAL HIGH (ref 4.0–10.5)
nRBC: 0 % (ref 0.0–0.2)

## 2021-03-06 MED ORDER — AZITHROMYCIN 250 MG PO TABS
500.0000 mg | ORAL_TABLET | Freq: Every day | ORAL | Status: AC
  Administered 2021-03-06: 500 mg via ORAL
  Filled 2021-03-06 (×2): qty 2

## 2021-03-06 MED ORDER — BUMETANIDE 1 MG PO TABS
1.0000 mg | ORAL_TABLET | Freq: Every day | ORAL | Status: DC
  Administered 2021-03-06 – 2021-03-07 (×2): 1 mg via ORAL
  Filled 2021-03-06 (×2): qty 1

## 2021-03-06 MED ORDER — RANOLAZINE ER 500 MG PO TB12
500.0000 mg | ORAL_TABLET | Freq: Every day | ORAL | Status: DC
  Administered 2021-03-06 – 2021-03-11 (×6): 500 mg via ORAL
  Filled 2021-03-06 (×7): qty 1

## 2021-03-06 MED ORDER — POTASSIUM CHLORIDE 20 MEQ PO PACK
20.0000 meq | PACK | Freq: Every day | ORAL | Status: DC
  Administered 2021-03-06 – 2021-03-08 (×3): 20 meq via ORAL
  Filled 2021-03-06 (×3): qty 1

## 2021-03-06 MED ORDER — POTASSIUM CHLORIDE CRYS ER 20 MEQ PO TBCR
20.0000 meq | EXTENDED_RELEASE_TABLET | Freq: Every day | ORAL | Status: DC
  Filled 2021-03-06: qty 1

## 2021-03-06 MED ORDER — ENOXAPARIN SODIUM 30 MG/0.3ML IJ SOSY
30.0000 mg | PREFILLED_SYRINGE | INTRAMUSCULAR | Status: DC
  Filled 2021-03-06: qty 0.3

## 2021-03-06 NOTE — ED Notes (Signed)
To vascular via stretcher 

## 2021-03-06 NOTE — Progress Notes (Addendum)
Subjective:  Patient is seen and examined at the bedside. Patient's chest pain is pretty much back to baseline, has improved since yesterday.  Patient's shortness of breath has also improved. Patient does complain of some fatigue.  Denies orthopnea, paroxysmal nocturnal dyspnea, leg swelling.  Intake/Output from previous day:  I/O last 3 completed shifts: In: 110.1 [IV Piggyback:110.1] Out: -  Total I/O In: -  Out: 500 [Urine:500]  Blood pressure (!) 131/54, pulse 75, temperature 98 F (36.7 C), temperature source Oral, resp. rate 19, height _0  (1.422 m), weight 46.4 kg, SpO2 95 %.  Review of Systems  Constitutional: Negative for malaise/fatigue and weight gain.  Cardiovascular:  Negative for chest pain (back to baseline), claudication, leg swelling, near-syncope, orthopnea, palpitations, paroxysmal nocturnal dyspnea and syncope.  Respiratory:  Negative for shortness of breath (improving).   Neurological:  Negative for dizziness.   Physical Exam Vitals reviewed.  Constitutional:      General: She is not in acute distress.    Comments: Frail  Neck:     Vascular: Carotid bruit (left more prominent than right) present. No JVD.  Cardiovascular:     Rate and Rhythm: Normal rate and regular rhythm.     Pulses: Intact distal pulses.     Heart sounds: S1 normal and S2 normal. Murmur heard.  High-pitched blowing holosystolic murmur is present at the apex radiating to the axilla.    No gallop.  Pulmonary:     Effort: Pulmonary effort is normal. No respiratory distress.     Breath sounds: No wheezing, rhonchi or rales.  Musculoskeletal:     Right lower leg: No edema.     Left lower leg: No edema.  Skin:    General: Skin is warm and dry.  Neurological:     Mental Status: She is alert.   Lab Results: BMP BNP (last 3 results) Recent Labs    03/05/21 0502  BNP 1,232.5*    ProBNP (last 3 results) No results for input(s): PROBNP in the last 8760 hours. BMP Latest Ref Rng  & Units 03/06/2021 03/05/2021 05/06/2020  Glucose 70 - 99 mg/dL 119(H) 118(H) 98  BUN 8 - 23 mg/dL 27(H) 25(H) 14  Creatinine 0.44 - 1.00 mg/dL 1.06(H) 1.32(H) 1.11(H)  BUN/Creat Ratio 12 - 28 - - 13  Sodium 135 - 145 mmol/L 131(L) 133(L) 137  Potassium 3.5 - 5.1 mmol/L 3.9 3.9 4.3  Chloride 98 - 111 mmol/L 98 99 99  CO2 22 - 32 mmol/L _1 Calcium 8.9 - 10.3 mg/dL 9.0 9.3 9.3   Hepatic Function Latest Ref Rng & Units 03/06/2021 03/05/2021 05/06/2020  Total Protein 6.5 - 8.1 g/dL 6.4(L) 6.5 7.2  Albumin 3.5 - 5.0 g/dL 3.3(L) 3.6 4.5  AST 15 - 41 U/L _2 ALT 0 - 44 U/L _3 Alk Phosphatase 38 - 126 U/L 56 61 100  Total Bilirubin 0.3 - 1.2 mg/dL 1.8(H) 1.5(H) 0.7  Bilirubin, Direct 0.0 - 0.2 mg/dL - 0.3(H) -   CBC Latest Ref Rng & Units 03/06/2021 03/05/2021 05/06/2020  WBC 4.0 - 10.5 K/uL 15.5(H) 13.7(H) 7.2  Hemoglobin 12.0 - 15.0 g/dL 10.9(L) 11.2(L) 12.2  Hematocrit 36.0 - 46.0 % 34.2(L) 35.0(L) 36.3  Platelets 150 - 400 K/uL 177 233 234   Lipid Panel     Component Value Date/Time   CHOL 157 05/06/2020 1354   TRIG 136 05/06/2020 1354   HDL 65 05/06/2020 1354   CHOLHDL 3.4 01/22/2016 0447  VLDL 26 01/22/2016 0447   LDLCALC 69 05/06/2020 1354   Cardiac Panel (last 3 results) No results for input(s): CKTOTAL, CKMB, TROPONINI, RELINDX in the last 72 hours.  HEMOGLOBIN A1C No results found for: HGBA1C, MPG TSH Recent Labs    03/05/21 0502  TSH 2.162   Imaging: DG Chest 2 View 03/05/2021 1. Low lung volumes with bibasilar areas atelectasis and/or consolidation.  2. Diffuse peribronchial cuffing and widespread interstitial prominence, similar to prior studies, suggesting chronic bronchitis.  3. Aortic atherosclerosis.  4. Large hiatal hernia.   Cardiac Studies: EKG: 03/05/2021: Normal sinus rhythm, 78 bpm, LAE, left axis, left bundle branch block, ST-T changes most likely secondary to LBBB.   Echocardiogram: 10/06/2020: Normal LV systolic function with  visual EF 55-60%. Left ventricle cavity is normal in size. Normal global wall motion. Basal septal hypertrophy. Diastolic dysfunction not accurate due to MAC but doppler would be consistent with grade III restrictive filling pressure and elevated LAP.   Left atrial cavity is severely dilated.  Mild to moderate aortic regurgitation. Degenerative and calcific mitral valve with subvalvular calcification. Mild to moderate prolapse, Eccentric jet wraps along the IAS consistent with severe (Grade III) mitral regurgitation. Moderate tricuspid valve regurgitation. Moderate pulmonary hypertension. RVSP measures 67 mmHg. Mild pulmonic regurgitation. IVC is dilated with a respiratory response of <50%. Compared to prior study dated 11/30/2018: LAE was moderately dilated now severe otherwise no significant change.   03/06/2021:  1. Left ventricular ejection fraction, by estimation, is 50 to 55%. The  left ventricle has low normal function. The left ventricle has no regional wall motion abnormalities. There is moderate asymmetric left ventricular hypertrophy of the basal-septal segment. Left ventricular diastolic parameters are consistent with Grade  III diastolic dysfunction (restrictive).   2. Right ventricular systolic function is mildly reduced. The right  ventricular size is mildly enlarged. There is moderately elevated  pulmonary artery systolic pressure. The estimated right ventricular  systolic pressure is 22.9 mmHg.   3. Left atrial size was severely dilated.   4. Right atrial size was mildly dilated.   5. The mitral valve is degenerative. Severe mitral valve regurgitation. Moderate mitral annular calcification.   6. Tricuspid valve regurgitation is moderate.   7. The aortic valve is tricuspid. Aortic valve regurgitation is trivial.  Mild to moderate aortic valve sclerosis/calcification is present, without any evidence of aortic stenosis.   8. The inferior vena cava is dilated in size with <50%  respiratory  variability, suggesting right atrial pressure of 15 mmHg.    Stress Testing:  Myocardial perfusion imaging 01/23/2016: 1. There is a large fixed defect involving the mid and distal anterior wall, anterior septum and anterior lateral wall. No reversibility identified. 2. There is hypokinesis involving the anterior septum, anterior wall and anterior lateral wall which corresponds to the large fixed defect. 3. Left ventricular ejection fraction 60% 4. Non invasive risk stratification*: low   Heart Catheterization: 12/18/2014: RPDA lesion, 70% stenosed. PTCA and stenting of the proximal and mid circumflex with a 3.0 x 38 mm resolute integrity DES for 90% stenosis, stenting of the mid RCA with a 2.75 x 26 mm resolute integrity DES for 99% stenosis, both reduced to 0%. LM lesion, 10% stenosed. LVEF 45% with mid to distal anterolateral akinesis, inferolateral mild hypokinesis.   Scheduled Meds:  aspirin EC  81 mg Oral QPC breakfast   atorvastatin  20 mg Oral Daily   azithromycin  500 mg Oral Daily   dorzolamide  2 drop Both Eyes  BID   enoxaparin (LOVENOX) injection  30 mg Subcutaneous Q24H   isosorbide mononitrate  60 mg Oral Daily   latanoprost  2 drop Both Eyes QHS   levothyroxine  75 mcg Oral QAC breakfast   metoprolol succinate  12.5 mg Oral Daily   Continuous Infusions: PRN Meds:.acetaminophen, nitroGLYCERIN  Assessment/Plan:  Candace Ross is a 87 y.o. Caucasian female whose past medical history and cardiovascular risk factors include: Coronary artery disease status post PCI proximal/mid LCx and RCA July 2016, history of HFpEF, severe mitral regurgitation, hypertension, hyperlipidemia, asymptomatic carotid artery stenosis, postmenopausal female, advanced age.  Acute on chronic exacerbation of HFpEF EF, stage C, NYHA class III Most likely secondary to underlying valvular heart disease. Repeat echocardiogram today relatively unchanged compared to previous.  LVEF  50-55% with grade 3 diastolic dysfunction.  Patient's mitral regurgitation is severe but stable Patient is euvolemic on exam and shortness of breath has improved following Lasix 40 mg IV push x2 since admission. Patient is no longer requiring supplemental oxygen support Strict I's and O's, daily weights Will discontinue Lasix and switch her to Bumex 1 mg p.o. daily Resume home potassium supplements   Established coronary disease with prior angioplasty and stenting with stable angina Patient underwent angioplasty and stenting in 2016 EKG without acute ischemic changes and high-sensitivity troponin essentially flat, not consistent with ACS Continue aspirin and statin therapy. Continue sublingual nitroglycerin tablets. Chest pain has essentially returned to patient's baseline. Discussed at length with patient management options, she does not wish to undergo invasive procedures, therefore shared decision was not to do cardiac catheterization. Will add Ranexa 500 mg once daily for additional antianginal management Given creatinine clearance less than 30 we will start with low-dose per pharmacy recommendations. Will repeat BMP in 1 week prior to outpatient follow-up.   Severe mitral regurgitation: Repeat echocardiogram shows mitral regurgitation is severe, relatively unchanged compared to previous.  Patient has discussed with Dr. Einar Gip (primary cardiologist) regarding option for surgical intervention on the mitral valve.  Patient has opted not to pursue surgical intervention. Continue diuresis   Left bundle branch block: Chronic and stable   Asymptomatic carotid artery stenosis: Bilateral carotid bruits. Asymptomatic. Continue aspirin and statin therapy. Monitor for now   Secondary diagnoses: Hyponatremia. Leukocytosis Hypertension. Hyperlipidemia   Will defer the management of her acute problem list and secondary diagnoses to the primary team.   Patient's chest pain is back to her  baseline chronic angina and she is euvolemic on exam.  Echocardiogram is relatively stable compared to previous.  Recommend keeping her inpatient for observation another day, if patient remains stable may consider discharge from a cardiology standpoint tomorrow.  Patient was seen in collaboration with Dr. Terri Skains. He also reviewed patient's chart and examined the patient. Dr. Terri Skains is in agreement of the plan.    Alethia Berthold, PA-C 03/06/2021, 1:48 PM Office: [phone]  ADDENDUM: Patient seen and examined independently. Note reviewed with the Lawerance Cruel, PA and relevant changes were made  I personally reviewed laboratory data, imaging studies and relevant notes.  I independently examined the patient and formulated the important aspects of the plan.  I have personally discussed the plan with the patient Comments or changes to the note/plan are indicated below.  My Exam:  Vitals with BMI 03/06/2021 03/06/2021 03/06/2021  Height - - _0   Weight - - 102 lbs 5 oz  BMI - - 31.49  Systolic 702 637 858  Diastolic 58 53 54  Pulse 65 69  10    CONSTITUTIONAL: Appears older than stated age, frail, hemodynamically stable, no acute distress  SKIN: Skin is warm and dry. No rash noted. No cyanosis. No pallor. No jaundice HEAD: Normocephalic and atraumatic.  EYES: No scleral icterus MOUTH/THROAT: Moist oral membranes.  NECK: No JVD present. No thyromegaly noted. No carotid bruits  LYMPHATIC: No visible cervical adenopathy.  CHEST Normal respiratory effort. No intercostal retractions  LUNGS: Clear to auscultation bilaterally.  No stridor. No wheezes. No rales.  CARDIOVASCULAR: Regular, positive M2-T1, holosystolic murmur heard at the apex, no gallops or rubs ABDOMINAL: No apparent ascites.  EXTREMITIES: No peripheral edema  HEMATOLOGIC: No significant bruising NEUROLOGIC: Oriented to person, place, and time. Nonfocal. Normal muscle tone.  PSYCHIATRIC: Normal mood and affect. Normal  behavior. Cooperative   Assessment & Plan:  Acute on chronic exacerbation of HFpEF, stage C, NYHA class II/III: Most likely secondary to underlying valvular heart disease. BNP elevated on admission. Has received parenteral diuretics which is facilitate diuresis and symptomatically patient has improved. We will transition her from Lasix to Bumex due to better bioavailability and less dependence on albumin status. Continue her current medical therapy. Echocardiogram personally reviewed which notes preserved LVEF, grade 3 diastolic impairment, left atrial pressures are elevated. Would recommend up titration of diuretic therapy as hemodynamics and laboratory values allow.  Given her age I would do this slowly titrated fashion. Otherwise see above  Established coronary artery disease with prior angioplasty/stenting with stable angina pectoris Patient states that her chest pain has improved significantly since coming to the ED. Symptoms are relatively back to baseline. Continue Toprol-XL Continue Imdur Will add Ranexa, per pharmacy recommendation 500 mg p.o. daily given her creatinine clearance.  Mitral regurgitation: Severe per transthoracic echo. In the past patient has discussed possible intervention with her primary cardiologist Dr. Einar Gip.  However, given her age and comorbid conditions patient has chose to treated medically.  She still feels the same. Monitor for now.  Plan of care discussed with family medicine resident for continuity of care purposes and patient care.  Time Spent with Patient: I have spent a total of 25 minutes with patient reviewing hospital notes, telemetry, EKGs, labs and examining the patient as well as establishing an assessment and plan that was discussed with the patient.  > 50% of time was spent in direct patient care.  This note was created using a voice recognition software as a result there may be grammatical errors inadvertently enclosed that do not  reflect the nature of this encounter. Every attempt is made to correct such errors.  Signed, Mechele Claude [facility]  Pager: [phone] Office: [phone] 03/06/2021 5:25 PM

## 2021-03-06 NOTE — ED Notes (Signed)
Lunch ordered 

## 2021-03-06 NOTE — ED Notes (Signed)
Patient ambulatory to restroom with one assist. Bed linen changed. Patient able to wash up at sink with new gown put in place. Denies any needs at this time

## 2021-03-06 NOTE — ED Notes (Signed)
Pt transferred to hospital bed for comfort.

## 2021-03-06 NOTE — Hospital Course (Addendum)
Candace Ross is a 87 y.o. female presenting with chest pain and dyspnea. PMH is significant for CAD s/p PCI in 2016, HFpEF, mitral regurgitation, LBBB,  HTN ,spinal stenosis w/ claudication, CKD stage III, Hypothyroidism, scoliosis and breast cancer.   Acute on Chronic CHF Exacerbation  HFpEF  09/2020 echo showing EF of 55-60% with grade III restrictive filling pressure,  severe LA dilation, severe MR. Presented with chest pain and dyspnea, BNP of 1,200, although no JVD and lower extremity edema. Was given Lasix 40 mg IV and treated with 1 dose of ceftriaxone, full course of Azithromycin. On 10/10 a procal was obtained at 0.56 and a CXR showing vascular congestion and increasing bilateral lower lobe opacities concerning for pulmonary edema. She desaturated when taken off O2. Given this, the patient was restarted on a third gen cephalosporin (cefdinir) for a 5 day course and diuresed further with Lasix 40 mg IV BID for several days. The patient was able to tolerate resting on room air but required O2 when ambulating. It was felt that she was as euvolemic as possible given her degenerate mitral valve and increasing Cr (up to 1.58, on a baseline of 1-1.1). Notably she had persistent hyponatremia around 125 which was felt to be a poor prognostic indicator. She was transitioned to PO Lasix 40 mg BID, an increase from her home regimen of 20 mg BID, with potassium supplementation of 20 meq BID. She was started on Farxiga for guideline directed medical therapy.   Atrial Fibrillation Patient developed afib on 10/11 with heart rates consistently below 100. Started on a heparin drip. Her CHADSVASC was a 4. With shared decision making, she was started on Eliqiuis 2.5 mg BID to avoid a stroke.   Goals of care Palliative care was consulted and her code status was changed from full code to DNR.   Chest pain, CAD s/p PCI in 2016 Patient presented with acute on chronic chest pain. Low concern for ACS given flat  trops. May have been acute worsening due to CHF exacerbation vs CAP. After the first day her pain dropped significantly in severity and was 3/10 in severity. Her home Imdur was changed to isordil. She was continued on her home ASA and atorvastatin. She was given PRN nitroglycerin and started on Ranexa 500 mg daily per cardiology recommendation.      Hypertension Home meds of Losartan 50 mg and Toprol 25 mg BID. Had been holding meds for soft BP. Her home Toprol was restarted.    CKD 3b Bl cr of 1-1.13. Up to 1.32 on admission. Trended down and then up again. It was felt that her renal function was a limiting factor for further diuresis. Cr of 1.58 on discharge, which is an AKI, thought to be secondary to decreased intravascular volume due to heavy diuresis. We expect this to improve with her discharge regimen of diuretic being much lower in dose.    Hx of Breast Cancer Was diagnosed with left breast invasive ductal Carcinoma in 2013 treated with needle localized lumpectomy.   Leukocytosis Developed a leukocytosis of 12 on the day of discharge. Infection was considered and thought to be unlikely given a CXR with no signs of pneumonia, no fever, no tachycardia.   Follow up recommendations: -Recheck BMET (adjust potassium supplementation as indicated).  -Consider scheduling Buspar for anxiety -Consider liberalizing slat intake given hyponatremia

## 2021-03-06 NOTE — Evaluation (Addendum)
Physical Therapy Evaluation Patient Details Name: Candace Ross MRN: [id number] DOB: 30-Mar-1927 Today's Date: 03/06/2021  History of Present Illness  Pt is a 87 y.o. who presents with acute on chronic exacerbation of HFpEF. Significant PMH: CAD s/p PCI, HFpEF, severe mitral regurgitation, HTN, left bundle branch block, asymptomatic CAD.  Clinical Impression  PTA, pt lives alone, is a limited Hydrographic surveyor and is independent with ADL's/IADL's. Pt presents with decreased functional mobility secondary to generalized weakness, decreased cardiopulmonary endurance, balance deficits. Pt ambulating 40 feet with a walker at a min guard assist level. Pt in and out of afib throughout, HR briefly up to 162 afib towards end; jumped back down to 90's once seated. Poor pleth overall, ranging from 84-100% on RA, returned to 2L O2. Pt does not feel she can currently care for herself. Recommend SNF to address deficits and maximize functional mobility.      Recommendations for follow up therapy are one component of a multi-disciplinary discharge planning process, led by the attending physician.  Recommendations may be updated based on patient status, additional functional criteria and insurance authorization.  Follow Up Recommendations SNF    Equipment Recommendations  3in1 (PT)    Recommendations for Other Services       Precautions / Restrictions Precautions Precautions: Fall;Other (comment) Precaution Comments: watch HR Restrictions Weight Bearing Restrictions: No      Mobility  Bed Mobility Overal bed mobility: Needs Assistance Bed Mobility: Supine to Sit     Supine to sit: Supervision          Transfers Overall transfer level: Needs assistance Equipment used: Rolling walker (2 wheeled) Transfers: Sit to/from Stand Sit to Stand: Min guard            Ambulation/Gait Ambulation/Gait assistance: Min guard Gait Distance (Feet): 40 Feet Assistive device: Rolling walker (2  wheeled) Gait Pattern/deviations: Step-through pattern;Decreased stride length;Trunk flexed Gait velocity: decreased Gait velocity interpretation: <1.8 ft/sec, indicate of risk for recurrent falls General Gait Details: Very slow pace, decreased bilateral foot clearance, increased trunk flexion, requires frequent rest breaks  Stairs            Wheelchair Mobility    Modified Rankin (Stroke Patients Only)       Balance Overall balance assessment: Needs assistance Sitting-balance support: Feet supported Sitting balance-Leahy Scale: Good     Standing balance support: During functional activity;Single extremity supported Standing balance-Leahy Scale: Poor Standing balance comment: reliant on external support                             Pertinent Vitals/Pain Pain Assessment: 0-10 Pain Score: 3  Pain Location: chest Pain Descriptors / Indicators: Discomfort Pain Intervention(s): Limited activity within patient's tolerance;Monitored during session    Home Living Family/patient expects to be discharged to:: Private residence Living Arrangements: Alone Available Help at Discharge: Family;Available PRN/intermittently Type of Home: House Home Access: Ramped entrance     Home Layout: One level Home Equipment: Walker - 4 wheels      Prior Function Level of Independence: Needs assistance   Gait / Transfers Assistance Needed: no AD, has recently had trouble ambulating to bus stop which she reports is 120 ft away  ADL's / Homemaking Assistance Needed: independent ADL's/IADL's, uses [employer] for transportation        Hand Dominance        Extremity/Trunk Assessment   Upper Extremity Assessment Upper Extremity Assessment: Generalized weakness    Lower Extremity  Assessment Lower Extremity Assessment: Generalized weakness    Cervical / Trunk Assessment Cervical / Trunk Assessment: Kyphotic  Communication   Communication: HOH  Cognition  Arousal/Alertness: Awake/alert Behavior During Therapy: WFL for tasks assessed/performed Overall Cognitive Status: Within Functional Limits for tasks assessed                                 General Comments: very HOH      General Comments      Exercises     Assessment/Plan    PT Assessment Patient needs continued PT services  PT Problem List Decreased strength;Decreased activity tolerance;Decreased balance;Decreased mobility;Cardiopulmonary status limiting activity       PT Treatment Interventions DME instruction;Gait training;Functional mobility training;Therapeutic activities;Therapeutic exercise;Balance training;Patient/family education    PT Goals (Current goals can be found in the Care Plan section)  Acute Rehab PT Goals Patient Stated Goal: return to baseline PT Goal Formulation: With patient Time For Goal Achievement: 03/20/21 Potential to Achieve Goals: Fair    Frequency Min 3X/week   Barriers to discharge        Co-evaluation               AM-PAC PT "6 Clicks" Mobility  Outcome Measure Help needed turning from your back to your side while in a flat bed without using bedrails?: None Help needed moving from lying on your back to sitting on the side of a flat bed without using bedrails?: A Little Help needed moving to and from a bed to a chair (including a wheelchair)?: A Little Help needed standing up from a chair using your arms (e.g., wheelchair or bedside chair)?: A Little Help needed to walk in hospital room?: A Little Help needed climbing 3-5 steps with a railing? : A Lot 6 Click Score: 18    End of Session   Activity Tolerance: Patient tolerated treatment well Patient left: in chair;with call bell/phone within reach;with chair alarm set Nurse Communication: Mobility status PT Visit Diagnosis: Unsteadiness on feet (R26.81);Muscle weakness (generalized) (M62.81);Difficulty in walking, not elsewhere classified (R26.2)    Time:  4332-9518 PT Time Calculation (min) (ACUTE ONLY): 39 min   Charges:   PT Evaluation $PT Eval Moderate Complexity: 1 Mod PT Treatments $Therapeutic Activity: 23-37 mins        Wyona Almas, PT, DPT Acute Rehabilitation Services Pager [phone] Office [phone]   Deno Etienne 03/06/2021, 4:38 PM

## 2021-03-06 NOTE — Progress Notes (Signed)
Family Medicine Teaching Service Daily Progress Note Intern Pager: [phone]  Patient name: Candace Ross Medical record number: [id number] Date of birth: 1926-09-04 Age: 87 y.o. Gender: female  Primary Care Provider: Buzzy Han, MD Consultants: cardiology Code Status: FULL  Pt Overview and Major Events to Date:  10/8: admitted  Assessment and Plan: Candace Ross is a 87 y.o. female presenting with chest pain and dyspnea. PMH is significant for CAD s/p PCI in 2016, HFpEF, mitral regurgitation, LBBB,  HTN ,spinal stenosis w/ claudication, CKD stage III, Hypothyroidism, scoliosis and breast cancer.  Acute on Chronic CHF Exacerbation  HFpEF  09/2020 echo showing EF of 55-60% with grade III restrictive filling pressure,  severe LA dilation, severe MR. Presented with chest pain and dyspnea, BNP of 1,200, although no JVD and lower extremity edema. S/p Lasix 40 mg IV with UOP unmeasured thus far. Takes 20 mg BID at home. Concern for ACS given CP, however, trops were flat. Also concern for CAP given CXR with bibasilar opacities (R>L) and leukocytosis to 15.5. However patient did not meet SIRS criteria.  Patient currently is satting well on RA. Vital signs notable for soft SBP 96-126 and MAPs 61-90. We feel her current presentation is more likely due to HF than CAP. -Discontinue CTX, given less concern for CAP -Continue Azithromycin 500 mg for 3 days (ends 10/11) -Plan to diurese with 40 mg IV Lasix again today, awaiting cards recs -F/u echo -Cardiac monitoring -Tylenol 500mg  Q6H PRN -Strict Is/Os -Daily weight  -Routine vitals -PT/OT  Chest pain, CAD s/p PCI in 2016 Patient presents with acute on chronic chest pain. Low concern for ACS given flat trops. May be 2/2 CHF exacerbation vs CAP. Cardiology following and recommending potential nitro drip if pain worsens. This morning, patient rates her pain a 1/10.   -Continue home ASA 81 mg and Imdur 60 mg daily -Continue home  medication of Atorvastatin 20 mg -Continue sublingual nitroglycerine  Hypertension Home meds of Losartan 50 and Toprol 12.5. Had been holding medds for soft BP. Unfortunately she got her AM metop.   -Hold home meds given soft pressures as above -Decide on metop tomorrow AM  CKD 3a Bl cr of 1-1.13. Up to 1.32 on admission. Much improved to 1.06 this AM.  -Daily BMP  Hx of Breast Cancer Was diagnosed with left breast invasive ductal Carcinoma in 2013 treated with needle localized lumpectomy.   FEN/GI: heart healthy diet PPx: Lovenox  Dispo:likely home pending continued improvement and PT/OT eval  Subjective:  On interview this morning patient reports that her chest pain is a 1/10 and minimal shortness of breath, satting well on RA. She is updated on her care plan and is able to perform a teach back.   Objective: Temp:  [97.6 F (36.4 C)-100.1 F (37.8 C)] 97.6 F (36.4 C) (10/08 1705) Pulse Rate:  [72-93] 77 (10/09 0600) Resp:  [10-33] 21 (10/09 0600) BP: (94-129)/(42-83) 114/42 (10/09 0600) SpO2:  [92 %-100 %] 95 % (10/09 0600) Weight:  [98 lb (44.5 kg)] 98 lb (44.5 kg) (10/08 1231) Physical Exam Vitals reviewed.  Cardiovascular:     Rate and Rhythm: Normal rate and regular rhythm.  Pulmonary:     Effort: Pulmonary effort is normal.     Comments: Bibasilar crackles Abdominal:     General: Bowel sounds are normal.     Tenderness: There is no abdominal tenderness.  Neurological:     Mental Status: She is alert.     Laboratory: Recent Labs  Lab 03/05/21 0502 03/06/21 0406  WBC 13.7* 15.5*  HGB 11.2* 10.9*  HCT 35.0* 34.2*  PLT 233 177   Recent Labs  Lab 03/05/21 0502 03/05/21 1246 03/06/21 0406  NA 133*  --  131*  K 3.9  --  3.9  CL 99  --  98  CO2 22  --  22  BUN 25*  --  27*  CREATININE 1.32*  --  1.06*  CALCIUM 9.3  --  9.0  PROT  --  6.5 6.4*  BILITOT  --  1.5* 1.8*  ALKPHOS  --  61 56  ALT  --  11 10  AST  --  24 17  GLUCOSE 118*  --  119*      Imaging/Diagnostic Tests: None new   Corky Sox PGY-1 [facility] Intern pager: [phone], text pages welcome

## 2021-03-06 NOTE — Progress Notes (Signed)
  Echocardiogram 2D Echocardiogram has been performed.  Candace Ross 03/06/2021, 10:59 AM

## 2021-03-06 NOTE — Progress Notes (Signed)
FPTS Brief Progress Note  S: Patient asleep and appears to be resting comfortably at time of night rounds. Discussed with primary RN who has no current concerns. RN held Metoprolol due to soft blood pressures.    O: BP (!) 105/48   Pulse 73   Temp 97.6 F (36.4 C) (Temporal)   Resp 19   Ht 4\' 8"  (1.422 m)   Wt 44.5 kg   SpO2 99%   BMI 21.97 kg/m   Gen: Elderly woman, asleep in bed   A/P: Acute on Chronic HF Exacerbation S/p Lasix. Troponin trended flat. Hemodynamically stable. BP have been soft and Metoprolol was held.  -Plan per day team -F/u Echo, CBC, CMP -Reassess Lasix dosage in AM - Orders reviewed. Labs for AM ordered, which was adjusted as needed.  - If condition changes, plan includes repeat Troponin/EKG/Chest x-ray.   Sharion Settler, DO 03/06/2021, 12:20 AM PGY-2, [facility] Night Resident  Please page [phone] with questions.

## 2021-03-07 ENCOUNTER — Telehealth: Payer: Self-pay

## 2021-03-07 ENCOUNTER — Inpatient Hospital Stay (HOSPITAL_COMMUNITY): Payer: Medicare HMO

## 2021-03-07 DIAGNOSIS — I5033 Acute on chronic diastolic (congestive) heart failure: Secondary | ICD-10-CM | POA: Diagnosis not present

## 2021-03-07 DIAGNOSIS — E871 Hypo-osmolality and hyponatremia: Secondary | ICD-10-CM | POA: Diagnosis not present

## 2021-03-07 DIAGNOSIS — H3530 Unspecified macular degeneration: Secondary | ICD-10-CM | POA: Diagnosis present

## 2021-03-07 DIAGNOSIS — H9190 Unspecified hearing loss, unspecified ear: Secondary | ICD-10-CM

## 2021-03-07 DIAGNOSIS — I130 Hypertensive heart and chronic kidney disease with heart failure and stage 1 through stage 4 chronic kidney disease, or unspecified chronic kidney disease: Secondary | ICD-10-CM | POA: Diagnosis not present

## 2021-03-07 DIAGNOSIS — J189 Pneumonia, unspecified organism: Secondary | ICD-10-CM | POA: Diagnosis not present

## 2021-03-07 DIAGNOSIS — H547 Unspecified visual loss: Secondary | ICD-10-CM

## 2021-03-07 LAB — CBC WITH DIFFERENTIAL/PLATELET
Abs Immature Granulocytes: 0.05 10*3/uL (ref 0.00–0.07)
Basophils Absolute: 0 10*3/uL (ref 0.0–0.1)
Basophils Relative: 0 %
Eosinophils Absolute: 0 10*3/uL (ref 0.0–0.5)
Eosinophils Relative: 0 %
HCT: 35.7 % — ABNORMAL LOW (ref 36.0–46.0)
Hemoglobin: 11.6 g/dL — ABNORMAL LOW (ref 12.0–15.0)
Immature Granulocytes: 0 %
Lymphocytes Relative: 9 %
Lymphs Abs: 1.2 10*3/uL (ref 0.7–4.0)
MCH: 28.9 pg (ref 26.0–34.0)
MCHC: 32.5 g/dL (ref 30.0–36.0)
MCV: 89 fL (ref 80.0–100.0)
Monocytes Absolute: 0.9 10*3/uL (ref 0.1–1.0)
Monocytes Relative: 7 %
Neutro Abs: 10.7 10*3/uL — ABNORMAL HIGH (ref 1.7–7.7)
Neutrophils Relative %: 84 %
Platelets: 201 10*3/uL (ref 150–400)
RBC: 4.01 MIL/uL (ref 3.87–5.11)
RDW: 14.6 % (ref 11.5–15.5)
WBC: 12.9 10*3/uL — ABNORMAL HIGH (ref 4.0–10.5)
nRBC: 0 % (ref 0.0–0.2)

## 2021-03-07 LAB — BASIC METABOLIC PANEL
Anion gap: 9 (ref 5–15)
BUN: 29 mg/dL — ABNORMAL HIGH (ref 8–23)
CO2: 24 mmol/L (ref 22–32)
Calcium: 9.2 mg/dL (ref 8.9–10.3)
Chloride: 97 mmol/L — ABNORMAL LOW (ref 98–111)
Creatinine, Ser: 1.2 mg/dL — ABNORMAL HIGH (ref 0.44–1.00)
GFR, Estimated: 42 mL/min — ABNORMAL LOW (ref >60)
Glucose, Bld: 111 mg/dL — ABNORMAL HIGH (ref 70–99)
Potassium: 4.5 mmol/L (ref 3.5–5.1)
Sodium: 130 mmol/L — ABNORMAL LOW (ref 135–145)

## 2021-03-07 LAB — PROCALCITONIN: Procalcitonin: 0.58 ng/mL

## 2021-03-07 LAB — MAGNESIUM: Magnesium: 2 mg/dL (ref 1.7–2.4)

## 2021-03-07 MED ORDER — CEFDINIR 300 MG PO CAPS
300.0000 mg | ORAL_CAPSULE | Freq: Every day | ORAL | Status: DC
  Administered 2021-03-07 – 2021-03-10 (×4): 300 mg via ORAL
  Filled 2021-03-07 (×4): qty 1

## 2021-03-07 MED ORDER — AMOXICILLIN-POT CLAVULANATE 500-125 MG PO TABS
1.0000 | ORAL_TABLET | Freq: Two times a day (BID) | ORAL | Status: DC
  Filled 2021-03-07: qty 1

## 2021-03-07 MED ORDER — BUMETANIDE 0.5 MG PO TABS
0.5000 mg | ORAL_TABLET | Freq: Every day | ORAL | Status: DC
  Filled 2021-03-07: qty 1

## 2021-03-07 MED ORDER — BISMUTH SUBSALICYLATE 262 MG/15ML PO SUSP
30.0000 mL | ORAL | Status: DC | PRN
  Administered 2021-03-07 – 2021-03-08 (×2): 30 mL via ORAL
  Filled 2021-03-07 (×2): qty 236

## 2021-03-07 MED ORDER — AZITHROMYCIN 250 MG PO TABS
500.0000 mg | ORAL_TABLET | Freq: Once | ORAL | Status: AC
  Administered 2021-03-07: 500 mg via ORAL
  Filled 2021-03-07: qty 2

## 2021-03-07 MED ORDER — METOPROLOL SUCCINATE ER 25 MG PO TB24
12.5000 mg | ORAL_TABLET | Freq: Every day | ORAL | Status: DC
  Administered 2021-03-07 – 2021-03-09 (×3): 12.5 mg via ORAL
  Filled 2021-03-07 (×3): qty 1

## 2021-03-07 NOTE — Telephone Encounter (Signed)
Pts brother called and stated that the pt is in the hospital. The hospital recommended that she go to a rehab to stay. Her insurance stated they would need two weeks before this could get approved by insurance. He would like to know if you could send a request into insurance to speed up the process. I advise the pt to call the hospital as well to get more information. Please advise.

## 2021-03-07 NOTE — Evaluation (Signed)
Occupational Therapy Evaluation Patient Details Name: Candace Ross MRN: [id number] DOB: [birth year] Today's Date: 03/07/2021   History of Present Illness Pt is a 87 y.o. who presents with acute on chronic exacerbation of HFpEF. Significant PMH: CAD s/p PCI, HFpEF, severe mitral regurgitation, HTN, left bundle branch block, asymptomatic CAD.   Clinical Impression   PTA, pt lives alone and reports typically ambulatory without AD, able to complete ADL/IADL tasks (including grocery shopping). Pt reports working with services for the blind and hearing impaired with in home modifications. Pt presents now with deficits in endurance, strength, balance, vision, and new O2 use impacting ability to complete ADL/IADL tasks safely. Pt overall Setup for UB ADLs, Min A for LB ADLs and min guard for mobility with RW. Recommend SNF rehab prior to DC home to maximize ADL, IADL and mobility tasks.      Recommendations for follow up therapy are one component of a multi-disciplinary discharge planning process, led by the attending physician.  Recommendations may be updated based on patient status, additional functional criteria and insurance authorization.   Follow Up Recommendations  SNF;Supervision - Intermittent    Equipment Recommendations  None recommended by OT    Recommendations for Other Services       Precautions / Restrictions Precautions Precautions: Fall;Other (comment) Precaution Comments: watch HR, O2 (does not wear at baseline) Restrictions Weight Bearing Restrictions: No      Mobility Bed Mobility Overal bed mobility: Needs Assistance Bed Mobility: Supine to Sit     Supine to sit: Min assist     General bed mobility comments: Min A to lift trunk    Transfers Overall transfer level: Needs assistance Equipment used: Rolling walker (2 wheeled) Transfers: Sit to/from Stand Sit to Stand: Min guard              Balance Overall balance assessment: Needs  assistance Sitting-balance support: Feet supported Sitting balance-Leahy Scale: Good     Standing balance support: During functional activity;Single extremity supported Standing balance-Leahy Scale: Fair Standing balance comment: fair static standing at sink with ADLs                           ADL either performed or assessed with clinical judgement   ADL Overall ADL's : Needs assistance/impaired Eating/Feeding: Independent;Sitting   Grooming: Set up;Standing;Oral care;Wash/dry face Grooming Details (indicate cue type and reason): assist to open toothpaste, able to place toothpaste on toothbrush without much issue (visual deficits) Upper Body Bathing: Set up;Sitting   Lower Body Bathing: Minimal assistance;Sit to/from stand   Upper Body Dressing : Set up;Sitting Upper Body Dressing Details (indicate cue type and reason): to don robe around back Lower Body Dressing: Minimal assistance;Sit to/from stand   Toilet Transfer: Min guard;Ambulation;RW   Toileting- Clothing Manipulation and Hygiene: Minimal assistance;Sit to/from stand Toileting - Clothing Manipulation Details (indicate cue type and reason): assist with clothing mgmt, pt able to perform peri care     Functional mobility during ADLs: Min guard;Rolling walker General ADL Comments: Pt limited by visual deficits, new use of O2. reports upset unable to order her own food - collaborated with pt to write cafeteria phone number in large, dark print with pt able to confirm ability to read to order lunch today     Vision Baseline Vision/History: 1 Wears glasses;3 Glaucoma;6 Macular Degeneration Ability to See in Adequate Light: 2 Moderately impaired Patient Visual Report: No change from baseline Vision Assessment?: Vision impaired- to be further  tested in functional context Additional Comments: reports macular degeneration and glaucoma - visual aids and hearing impaired devices used at home (see home setup)      Perception     Praxis      Pertinent Vitals/Pain Pain Assessment: No/denies pain Pain Intervention(s): Monitored during session     Hand Dominance Right   Extremity/Trunk Assessment Upper Extremity Assessment Upper Extremity Assessment: Generalized weakness   Lower Extremity Assessment Lower Extremity Assessment: Defer to PT evaluation   Cervical / Trunk Assessment Cervical / Trunk Assessment: Kyphotic   Communication Communication Communication: HOH   Cognition Arousal/Alertness: Awake/alert Behavior During Therapy: WFL for tasks assessed/performed Overall Cognitive Status: Within Functional Limits for tasks assessed                                 General Comments: very HOH, some decreased awareness of deficits, likely at cognitive baseline   General Comments  Questionable pleth with o2 (initially 78% on RA for toileting task, up to 88% quickly and then 100%). 1/4 DOE HR WFL. Pt initially declining OOB activities but openly discussed visual deficits and impact on daily life with OT. When OT planning to exit, pt then reported desire to walk in room a little bit,    Exercises     Shoulder Instructions      Home Living Family/patient expects to be discharged to:: Private residence Living Arrangements: Alone Available Help at Discharge: Family;Available PRN/intermittently Type of Home: House Home Access: Ramped entrance     Home Layout: One level     Bathroom Shower/Tub: Tub/shower unit;Walk-in shower   Bathroom Toilet: Standard     Home Equipment: Environmental consultant - 4 wheels;Shower seat;Grab bars - tub/shower   Additional Comments: also has adaptive devices from services for the blind and hearing impaired (doorbell that shakes bed, louder alarm clock, pegs on microwave buttons, large print computer)      Prior Functioning/Environment Level of Independence: Needs assistance  Gait / Transfers Assistance Needed: no AD, has recently had trouble ambulating  to bus stop which she reports is 120 ft away ADL's / Homemaking Assistance Needed: independent ADL's/IADL's, uses [employer] for transportation, but also reports driving at times and grocery shopping   Comments: Endorses assist from services for the blind and hearing impaired; limited by low vision and hearing deficits        OT Problem List: Decreased strength;Decreased activity tolerance;Impaired balance (sitting and/or standing);Impaired vision/perception      OT Treatment/Interventions: Self-care/ADL training;Therapeutic exercise;Energy conservation;DME and/or AE instruction;Therapeutic activities;Patient/family education;Balance training    OT Goals(Current goals can be found in the care plan section) Acute Rehab OT Goals Patient Stated Goal: return to baseline OT Goal Formulation: With patient Time For Goal Achievement: 03/21/21 Potential to Achieve Goals: Good ADL Goals Pt Will Perform Lower Body Bathing: with modified independence;sit to/from stand Pt Will Perform Lower Body Dressing: with modified independence;sit to/from stand Pt Will Transfer to Toilet: with modified independence;ambulating Pt/caregiver will Perform Home Exercise Program: Increased strength;Both right and left upper extremity;With theraband;Independently;With written HEP provided Additional ADL Goal #1: Pt to independently implement at least 2 visual compensatory strategies during functional tasks to maximize safety/independence  OT Frequency: Min 2X/week   Barriers to D/C:            Co-evaluation              AM-PAC OT "6 Clicks" Daily Activity     Outcome Measure  Help from another person eating meals?: None Help from another person taking care of personal grooming?: A Little Help from another person toileting, which includes using toliet, bedpan, or urinal?: A Little Help from another person bathing (including washing, rinsing, drying)?: A Little Help from another person to put on and taking off  regular upper body clothing?: A Little Help from another person to put on and taking off regular lower body clothing?: A Little 6 Click Score: 19   End of Session Equipment Utilized During Treatment: Rolling walker;Oxygen Nurse Communication: Mobility status;Other (comment) (vision)  Activity Tolerance: Patient tolerated treatment well Patient left: with call bell/phone within reach;with nursing/sitter in room;in bed;Other (comment) (sitting EOB with NT assessing vitals)  OT Visit Diagnosis: Muscle weakness (generalized) (M62.81);Other abnormalities of gait and mobility (R26.89)                Time: 3276-1470 OT Time Calculation (min): 44 min Charges:  OT General Charges $OT Visit: 1 Visit OT Evaluation $OT Eval Low Complexity: 1 Low OT Treatments $Self Care/Home Management : 8-22 mins $Therapeutic Activity: 8-22 mins  Malachy Chamber, OTR/L Acute Rehab Services Office: [phone]   Layla Maw 03/07/2021, 8:49 AM

## 2021-03-07 NOTE — Progress Notes (Signed)
Progress Note  Patient Name: Candace Ross Date of Encounter: 03/07/2021  Attending physician: McDiarmid, Blane Ohara, MD Primary care provider: Buzzy Han, MD Primary Cardiologist: Dr. Einar Gip  Consultant:Estel Tonelli Terri Skains, DO  Subjective: Candace Ross is a 87 y.o. female who was seen and examined at bedside  Chest pain and shortness of breath are well controlled. Still feels tired and fatigued. Still on nasal cannula oxygen -minimal supplementation  Objective: Vital Signs in the last 24 hours: Temp:  [97.7 F (36.5 C)-99 F (37.2 C)] 98.1 F (36.7 C) (10/10 0827) Pulse Rate:  [65-79] 78 (10/10 0827) Resp:  [15-24] 21 (10/10 0408) BP: (105-161)/(50-91) 122/50 (10/10 0408) SpO2:  [93 %-99 %] 98 % (10/10 0408) Weight:  [45 kg-46.4 kg] 45 kg (10/10 0005)  Intake/Output:  Intake/Output Summary (Last 24 hours) at 03/07/2021 0858 Last data filed at 03/07/2021 0824 Gross per 24 hour  Intake 600 ml  Output 500 ml  Net 100 ml    Net IO Since Admission: 210.1 mL [03/07/21 0858]  Weights:  Filed Weights   03/05/21 1231 03/06/21 1300 03/07/21 0005  Weight: 44.5 kg 46.4 kg 45 kg    Telemetry: Personally reviewed.  Normal sinus rhythm with PACs.  Physical examination: PHYSICAL EXAM: Vitals with BMI 03/07/2021 03/07/2021 03/07/2021  Height - - -  Weight - - -  BMI - - -  Systolic - 621 308  Diastolic - 50 54  Pulse 78 79 72    CONSTITUTIONAL: Appears older than stated age, frail, hemodynamically stable, no acute distress  SKIN: Skin is warm and dry. No rash noted. No cyanosis. No pallor. No jaundice HEAD: Normocephalic and atraumatic.  EYES: No scleral icterus MOUTH/THROAT: Moist oral membranes.  NECK: No JVD present. No thyromegaly noted. No carotid bruits  LYMPHATIC: No visible cervical adenopathy.  CHEST Normal respiratory effort. No intercostal retractions  LUNGS: Clear to auscultation bilaterally.  No stridor. No wheezes. No rales.  CARDIOVASCULAR:  Regular, positive M5-H8, holosystolic murmur heard at the apex, no gallops or rubs ABDOMINAL: Soft, nontender, nondistended, positive bowel sounds in all 4 quadrants, no apparent ascites.  EXTREMITIES: No peripheral edema, dry skin, warm to touch HEMATOLOGIC: No significant bruising NEUROLOGIC: Oriented to person, place, and time. Nonfocal. Normal muscle tone.  PSYCHIATRIC: Normal mood and affect. Normal behavior. Cooperative  Lab Results: Hematology Recent Labs  Lab 03/05/21 0502 03/06/21 0406  WBC 13.7* 15.5*  RBC 3.89 3.77*  HGB 11.2* 10.9*  HCT 35.0* 34.2*  MCV 90.0 90.7  MCH 28.8 28.9  MCHC 32.0 31.9  RDW 14.6 15.0  PLT 233 177    Chemistry Recent Labs  Lab 03/05/21 0502 03/05/21 1246 03/06/21 0406 03/07/21 0334  NA 133*  --  131* 130*  K 3.9  --  3.9 4.5  CL 99  --  98 97*  CO2 22  --  22 24  GLUCOSE 118*  --  119* 111*  BUN 25*  --  27* 29*  CREATININE 1.32*  --  1.06* 1.20*  CALCIUM 9.3  --  9.0 9.2  PROT  --  6.5 6.4*  --   ALBUMIN  --  3.6 3.3*  --   AST  --  24 17  --   ALT  --  11 10  --   ALKPHOS  --  61 56  --   BILITOT  --  1.5* 1.8*  --   GFRNONAA 38*  --  49* 42*  ANIONGAP 12  --  11 9  Cardiac Enzymes: Cardiac Panel (last 3 results) Recent Labs    03/05/21 0502 03/05/21 1246  TROPONINIHS 20* 19*    BNP (last 3 results) Recent Labs    03/05/21 0502  BNP 1,232.5*    ProBNP (last 3 results) No results for input(s): PROBNP in the last 8760 hours.   DDimer No results for input(s): DDIMER in the last 168 hours.   Hemoglobin A1c: No results found for: HGBA1C, MPG  TSH  Recent Labs    03/05/21 0502  TSH 2.162    Lipid Panel     Component Value Date/Time   CHOL 157 05/06/2020 1354   TRIG 136 05/06/2020 1354   HDL 65 05/06/2020 1354   CHOLHDL 3.4 01/22/2016 0447   VLDL 26 01/22/2016 0447   LDLCALC 69 05/06/2020 1354    Imaging: ECHOCARDIOGRAM COMPLETE  Result Date: 03/06/2021    ECHOCARDIOGRAM REPORT   Patient  Name:   Candace Ross Date of Exam: 03/06/2021 Medical Rec #:  [id number]         Height:       56.0 in Accession #:    [id number]        Weight:       98.0 lb Date of Birth:  [birth year]         BSA:          1.311 m Patient Age:    48 years          BP:           126/105 mmHg Patient Gender: F                 HR:           77 bpm. Exam Location:  Inpatient Procedure: 2D Echo Indications:    mitral valve disorder. dyspnea.  History:        Patient has prior history of Echocardiogram examinations, most                 recent 11/30/2018. Mitral Valve Disease, Signs/Symptoms:Shortness                 of Breath and Chest Pain; Risk Factors:Hypertension.  Sonographer:    Johny Chess RDCS Referring Phys: [place]  1. Left ventricular ejection fraction, by estimation, is 50 to 55%. The left ventricle has low normal function. The left ventricle has no regional wall motion abnormalities. There is moderate asymmetric left ventricular hypertrophy of the basal-septal segment. Left ventricular diastolic parameters are consistent with Grade III diastolic dysfunction (restrictive).  2. Right ventricular systolic function is mildly reduced. The right ventricular size is mildly enlarged. There is moderately elevated pulmonary artery systolic pressure. The estimated right ventricular systolic pressure is 62.8 mmHg.  3. Left atrial size was severely dilated.  4. Right atrial size was mildly dilated.  5. The mitral valve is degenerative. Severe mitral valve regurgitation. Moderate mitral annular calcification.  6. Tricuspid valve regurgitation is moderate.  7. The aortic valve is tricuspid. Aortic valve regurgitation is trivial. Mild to moderate aortic valve sclerosis/calcification is present, without any evidence of aortic stenosis.  8. The inferior vena cava is dilated in size with <50% respiratory variability, suggesting right atrial pressure of 15 mmHg. FINDINGS  Left Ventricle: Left ventricular  ejection fraction, by estimation, is 50 to 55%. The left ventricle has low normal function. The left ventricle has no regional wall motion abnormalities. The left ventricular internal cavity size was normal in size. There is  moderate asymmetric left ventricular hypertrophy of the basal-septal segment. Left ventricular diastolic parameters are consistent with Grade III diastolic dysfunction (restrictive). Right Ventricle: The right ventricular size is mildly enlarged. Right vetricular wall thickness was not well visualized. Right ventricular systolic function is mildly reduced. There is moderately elevated pulmonary artery systolic pressure. The tricuspid  regurgitant velocity is 2.89 m/s, and with an assumed right atrial pressure of 15 mmHg, the estimated right ventricular systolic pressure is 02.7 mmHg. Left Atrium: Left atrial size was severely dilated. Right Atrium: Right atrial size was mildly dilated. Pericardium: There is no evidence of pericardial effusion. Mitral Valve: The mitral valve is degenerative in appearance. Moderate mitral annular calcification. Severe mitral valve regurgitation. Tricuspid Valve: The tricuspid valve is normal in structure. Tricuspid valve regurgitation is moderate. Aortic Valve: The aortic valve is tricuspid. Aortic valve regurgitation is trivial. Mild to moderate aortic valve sclerosis/calcification is present, without any evidence of aortic stenosis. Pulmonic Valve: The pulmonic valve was not well visualized. Pulmonic valve regurgitation is trivial. Aorta: The aortic root and ascending aorta are structurally normal, with no evidence of dilitation. Venous: The inferior vena cava is dilated in size with less than 50% respiratory variability, suggesting right atrial pressure of 15 mmHg. IAS/Shunts: The interatrial septum was not well visualized.  LEFT VENTRICLE PLAX 2D LVIDd:         4.10 cm      Diastology LVIDs:         3.10 cm      LV e' medial:    5.55 cm/s LV PW:         1.20  cm      LV E/e' medial:  25.4 LV IVS:        1.00 cm      LV e' lateral:   8.49 cm/s LVOT diam:     1.90 cm      LV E/e' lateral: 16.6 LVOT Area:     2.84 cm  LV Volumes (MOD) LV vol d, MOD A2C: 106.0 ml LV vol d, MOD A4C: 93.0 ml LV vol s, MOD A2C: 48.6 ml LV vol s, MOD A4C: 50.1 ml LV SV MOD A2C:     57.4 ml LV SV MOD A4C:     93.0 ml LV SV MOD BP:      52.8 ml RIGHT VENTRICLE            IVC RV S prime:     7.07 cm/s  IVC diam: 2.50 cm TAPSE (M-mode): 1.7 cm LEFT ATRIUM              Index        RIGHT ATRIUM           Index LA diam:        5.10 cm  3.89 cm/m   RA Area:     15.40 cm LA Vol (A2C):   91.5 ml  69.80 ml/m  RA Volume:   36.30 ml  27.69 ml/m LA Vol (A4C):   104.0 ml 79.33 ml/m LA Biplane Vol: 100.0 ml 76.28 ml/m   AORTA Ao Root diam: 2.80 cm Ao Asc diam:  3.40 cm MITRAL VALVE                TRICUSPID VALVE MV Area (PHT): 4.49 cm     TR Peak grad:   33.4 mmHg MV Decel Time: 169 msec     TR Vmax:        289.00 cm/s MR PISA:  3.08 cm MR PISA Radius: 0.70 cm     SHUNTS MV E velocity: 141.00 cm/s  Systemic Diam: 1.90 cm MV A velocity: 52.90 cm/s MV E/A ratio:  2.67 Oswaldo Milian MD Electronically signed by Oswaldo Milian MD Signature Date/Time: 03/06/2021/1:15:31 PM    Final     Cardiac Studies: EKG: 03/05/2021: Normal sinus rhythm, 78 bpm, LAE, left axis, left bundle branch block, ST-T changes most likely secondary to LBBB.   Echocardiogram: 10/06/2020: Normal LV systolic function with visual EF 55-60%. Left ventricle cavity is normal in size. Normal global wall motion. Basal septal hypertrophy. Diastolic dysfunction not accurate due to MAC but doppler would be consistent with grade III restrictive filling pressure and elevated LAP.   Left atrial cavity is severely dilated.  Mild to moderate aortic regurgitation. Degenerative and calcific mitral valve with subvalvular calcification. Mild to moderate prolapse, Eccentric jet wraps along the IAS consistent with severe (Grade  III) mitral regurgitation. Moderate tricuspid valve regurgitation. Moderate pulmonary hypertension. RVSP measures 67 mmHg. Mild pulmonic regurgitation. IVC is dilated with a respiratory response of <50%. Compared to prior study dated 11/30/2018: LAE was moderately dilated now severe otherwise no significant change.    03/06/2021:  1. Left ventricular ejection fraction, by estimation, is 50 to 55%. The  left ventricle has low normal function. The left ventricle has no regional wall motion abnormalities. There is moderate asymmetric left ventricular hypertrophy of the basal-septal segment. Left ventricular diastolic parameters are consistent with Grade  III diastolic dysfunction (restrictive).   2. Right ventricular systolic function is mildly reduced. The right  ventricular size is mildly enlarged. There is moderately elevated  pulmonary artery systolic pressure. The estimated right ventricular  systolic pressure is 50.0 mmHg.   3. Left atrial size was severely dilated.   4. Right atrial size was mildly dilated.   5. The mitral valve is degenerative. Severe mitral valve regurgitation. Moderate mitral annular calcification.   6. Tricuspid valve regurgitation is moderate.   7. The aortic valve is tricuspid. Aortic valve regurgitation is trivial.  Mild to moderate aortic valve sclerosis/calcification is present, without any evidence of aortic stenosis.   8. The inferior vena cava is dilated in size with <50% respiratory  variability, suggesting right atrial pressure of 15 mmHg.    Stress Testing:  Myocardial perfusion imaging 01/23/2016: 1. There is a large fixed defect involving the mid and distal anterior wall, anterior septum and anterior lateral wall. No reversibility identified. 2. There is hypokinesis involving the anterior septum, anterior wall and anterior lateral wall which corresponds to the large fixed defect. 3. Left ventricular ejection fraction 60% 4. Non invasive risk  stratification*: low   Heart Catheterization: 12/18/2014: RPDA lesion, 70% stenosed. PTCA and stenting of the proximal and mid circumflex with a 3.0 x 38 mm resolute integrity DES for 90% stenosis, stenting of the mid RCA with a 2.75 x 26 mm resolute integrity DES for 99% stenosis, both reduced to 0%. LM lesion, 10% stenosed. LVEF 45% with mid to distal anterolateral akinesis, inferolateral mild hypokinesis.  Scheduled Meds:  aspirin EC  81 mg Oral QPC breakfast   atorvastatin  20 mg Oral Daily   azithromycin  500 mg Oral Daily   bumetanide  1 mg Oral Daily   dorzolamide  2 drop Both Eyes BID   enoxaparin (LOVENOX) injection  30 mg Subcutaneous Q24H   isosorbide mononitrate  60 mg Oral Daily   latanoprost  2 drop Both Eyes QHS   levothyroxine  75  mcg Oral QAC breakfast   metoprolol succinate  12.5 mg Oral Daily   potassium chloride  20 mEq Oral Daily   ranolazine  500 mg Oral Daily    Continuous Infusions:   PRN Meds: acetaminophen, nitroGLYCERIN   IMPRESSION & RECOMMENDATIONS: ANEITA KIGER is a 87 y.o. female whose past medical history and cardiac risk factors include: Coronary artery disease status post PCI proximal/mid LCx and RCA July 2016, history of HFpEF, severe mitral regurgitation, hypertension, hyperlipidemia, asymptomatic carotid artery stenosis, postmenopausal female, advanced age.  Acute on chronic exacerbation of HFpEF, stage C, NYHA class II: Improving. Patient is less dyspneic. Most likely secondary to underlying valvular heart disease. BNP elevated on admission. Mild renal insufficiency with up titration of diuretic therapy.  Will reduce Bumex to 0.5 mg p.o. daily Continue her current medical therapy. Echocardiogram personally reviewed: preserved LVEF, grade 3 diastolic impairment, elevated LAP  From a cardiovascular standpoint she can be discharged home as clinically she has improved.  She will need close outpatient follow-up.  Outpatient follow-up  scheduled for 03/18/2021.  Established coronary artery disease with prior angioplasty/stenting with stable angina pectoris Patient states that her chest pain has improved significantly since coming to the ED. Symptoms are relatively back to baseline. Continue Toprol-XL Continue Imdur Tolerating Ranexa well. Per pharmacy recommendation 500 mg p.o. daily given her creatinine clearance.   Mitral regurgitation: Severe per transthoracic echo. In the past patient has discussed possible intervention with her primary cardiologist Dr. Einar Gip.  However, given her age and comorbid conditions patient has chose to treated medically.  She still feels the same. Monitor for now.  Left bundle branch block: Chronic and stable   Asymptomatic carotid artery stenosis: Bilateral carotid bruits. Asymptomatic. Continue aspirin and statin therapy. Monitor for now   Secondary diagnoses: Hyponatremia. Leukocytosis Hypertension. Hyperlipidemia  From a cardiovascular standpoint she can be discharged home.  However, management of leukocytosis and need for home O2 evaluation will defer to primary team at this time.  Please call if questions or concerns arise.  Patient's questions and concerns were addressed to her satisfaction. She voices understanding of the instructions provided during this encounter.   This note was created using a voice recognition software as a result there may be grammatical errors inadvertently enclosed that do not reflect the nature of this encounter. Every attempt is made to correct such errors.  Rex Kras, DO, [facility]. [place] Office: [phone] 03/07/2021, 8:58 AM

## 2021-03-07 NOTE — Progress Notes (Signed)
  Mobility Specialist Criteria Algorithm Info.  Mobility Team: HOB elevated:HOB 30 Activity: Ambulated in hall; Transferred to/from [place] of motion: Active; All extremities Level of assistance: Contact guard assist, steadying assist Assistive device: Front wheel walker Minutes sitting in chair:  Minutes stood:  Minutes ambulated: 7 minutes Distance ambulated (ft): 220 ft Mobility response: Tolerated well Bed Position: Semi-fowlers  Patient received standing at the bedside, just finished using BSC. Agreed to participate in mobility. Ambulated in hallway at min guard with slow steady gait. Required constant cues to stay proximal to RW. Pt is dyspneic with exertion, completed brief education on energy conservation, pursed lip breathing and taking frequent rest breaks to help with SOB. Tolerated ambulation without complaint or incident. Pt with poor pleth so unable to get accurate SPO2 reading. Oxygen saturation fluctuated from 85%-96% on 2LO2. RN & MD notified. Will reattempt tomorrow.   03/07/2021 3:05 PM

## 2021-03-07 NOTE — Progress Notes (Signed)
Family Medicine Teaching Service Daily Progress Note Intern Pager: [phone]  Patient name: Candace Ross Medical record number: [id number] Date of birth: 04/15/1927 Age: 87 y.o. Gender: female  Primary Care Provider: Buzzy Han, MD Consultants: cardiology Code Status: FULL  Pt Overview and Major Events to Date:  10/8:admitted  Assessment and Plan: Candace Ross is a 87 y.o. female presenting with chest pain and dyspnea. PMH is significant for CAD s/p PCI in 2016, HFpEF, mitral regurgitation, LBBB,  HTN ,spinal stenosis w/ claudication, CKD stage III, Hypothyroidism, scoliosis and breast cancer.  Acute hypoxic respiratory failure 2/2 HFpEF exacerbation vs CAP New echo with 50-55%, G3DD, severely dilated LA, severe MR. S/P 1 mg Bumex yesterday, volume exam is similar to yesterday with trace pitting edema in the lower extremities and bibasilar crackles. Satting appopriately on 2L [place] this AM, worse than yesterday. Desats when taken off O2. UOP documented as 500 mL, but several unmeasured occurrences. SBP fine but MAPs from 68-81. Will give Toprol 12.5 mg this morning. Home dose of 25 mg BID.  -Continue Azithromycin 500 mg for 3 days (ends 10/11) -Repeat CBC, repeat CXR, procal, given worsening O2 requirement -Repeat Bumex 1 mg today -Cardiac monitoring -Tylenol 500mg  Q6H PRN -Strict Is/Os -Daily weight  -Routine vitals -PT/OT -TOC consult given patient refusing SNF  Chest pain, CAD s/p PCI in 2016 Concern for ST elevations on cardiac monitoring this AM. EKG this showing LBBB, no ST elevations. NSR. Patient in and out of afib.  -Continue home ASA 81 mg and Imdur 60 mg daily -Continue home medication of Atorvastatin 20 mg -Continue sublingual nitroglycerine -Added Ranexa 500 mg   Hypertension Home meds of Losartan 50 and Toprol 25 mg BID. Had been holding meds for soft BP. -Toprol 12.5 mg today, plan to increase to 12.5 BID -Hold losartan  CKD 3a Bl cr of  1-1.13. Up to 1.32 on admission. Currently 1.2.  -Daily BMP  Hyponatremia Na of 130 this AM, may be 2/2 HF exacerbation. -CTM with daily BMP  Hx of Breast Cancer Was diagnosed with left breast invasive ductal Carcinoma in 2013 treated with needle localized lumpectomy.   FEN/GI: heart healthy PPx: Lovenox Dispo:PT/OT recommends SNF, currently patient refuses  Subjective:  On interview this morning patient remains alert and oriented however she is more argumentative than previous days. She refuses SNF and insists on going home. However, she relates that her family is not as supportive as she would like. Reports 3/10 chest pain and mild SoB.   Objective: Temp:  [97.7 F (36.5 C)-99 F (37.2 C)] 98.1 F (36.7 C) (10/10 0827) Pulse Rate:  [65-79] 78 (10/10 0827) Resp:  [15-24] 21 (10/10 0408) BP: (105-161)/(50-91) 122/50 (10/10 0408) SpO2:  [93 %-99 %] 98 % (10/10 0408) Weight:  [99 lb 4.8 oz (45 kg)-102 lb 4.7 oz (46.4 kg)] 99 lb 4.8 oz (45 kg) (10/10 0005) Physical Exam Cardiovascular:     Rate and Rhythm: Normal rate. Rhythm irregular.  Pulmonary:     Comments: Bibasilar crackles on auscultation Abdominal:     General: Bowel sounds are normal.     Palpations: Abdomen is soft.  Neurological:     Mental Status: She is alert.     Laboratory: Recent Labs  Lab 03/05/21 0502 03/06/21 0406  WBC 13.7* 15.5*  HGB 11.2* 10.9*  HCT 35.0* 34.2*  PLT 233 177   Recent Labs  Lab 03/05/21 0502 03/05/21 1246 03/06/21 0406 03/07/21 0334  NA 133*  --  131* 130*  K 3.9  --  3.9 4.5  CL 99  --  98 97*  CO2 22  --  22 24  BUN 25*  --  27* 29*  CREATININE 1.32*  --  1.06* 1.20*  CALCIUM 9.3  --  9.0 9.2  PROT  --  6.5 6.4*  --   BILITOT  --  1.5* 1.8*  --   ALKPHOS  --  61 56  --   ALT  --  11 10  --   AST  --  24 17  --   GLUCOSE 118*  --  119* 111*     Imaging/Diagnostic Tests: Echo as above   Corky Sox PGY-1 [place] Intern pager: [phone], text pages  welcome

## 2021-03-07 NOTE — [place] FL2 (Signed)
[place] MEDICAID FL2 LEVEL OF CARE SCREENING TOOL     IDENTIFICATION  Patient Name: Candace Ross Birthdate: 09/29/1926 Sex: female Admission Date (Current Location): 03/05/2021  [facility] and [place] Number:  Herbalist and Address:  [facility], [place] [address], [place], [place] [address]      Provider Number: [id number]  Attending Physician Name and Address:  McDiarmid, Blane Ohara, MD  Relative Name and Phone Number:       Current Level of Care: Hospital Recommended Level of Care: [place] Prior Approval Number:    Date Approved/Denied:   PASRR Number: [id number]  Discharge Plan: SNF    Current Diagnoses: Patient Active Problem List   Diagnosis Date Noted   Macular degeneration 03/07/2021   Visual impairment 03/07/2021   Hard of hearing 03/07/2021   Chest pain 03/06/2021   Community acquired pneumonia    Dyspnea 03/05/2021   Acute on chronic heart failure with preserved ejection fraction (HFpEF) (HCC)    Atherosclerosis of native coronary artery of native heart without angina pectoris    History of coronary angioplasty with insertion of stent    Severe mitral regurgitation    LBBB (left bundle branch block)    Asymptomatic bilateral carotid artery stenosis    Hyponatremia    Leukocytosis    Hypertensive heart and kidney disease with HF and with CKD stage III ([place])    Mixed hyperlipidemia    Acute on chronic diastolic heart failure ([place]) 11/29/2018   Benign hypertension 11/29/2018   Shortness of breath 11/29/2018   Acute on chronic diastolic CHF (congestive heart failure) ([place]) 11/29/2018   Spinal stenosis of lumbar region with neurogenic claudication 01/23/2017   Scoliosis of thoracolumbar spine 01/23/2017   Lumbar radiculopathy 01/23/2017   CAD (coronary artery disease), native coronary artery 12/18/2014   Angina pectoris ([place]) 12/17/2014   Cancer of central portion of female breast ([place]) 10/20/2011     Orientation RESPIRATION BLADDER Height & Weight     Time, Self, Situation  Normal Incontinent Weight: 99 lb 4.8 oz (45 kg) Height:  4\' 8"  (142.2 cm)  BEHAVIORAL SYMPTOMS/MOOD NEUROLOGICAL BOWEL NUTRITION STATUS      Incontinent Diet  AMBULATORY STATUS COMMUNICATION OF NEEDS Skin     Verbally                         Personal Care Assistance Level of Assistance  Bathing, Feeding Bathing Assistance: Limited assistance         Functional Limitations Info  Hearing, Sight, Speech Sight Info: Impaired Hearing Info: Adequate Speech Info: Adequate    SPECIAL CARE FACTORS FREQUENCY  PT (By licensed PT), OT (By licensed OT)     PT Frequency: 5x a week OT Frequency: 5x a week            Contractures Contractures Info: Not present    Additional Factors Info  Code Status, Allergies Code Status Info: Full Allergies Info: Penicillins   Other           Current Medications (03/07/2021):  This is the current hospital active medication list Current Facility-Administered Medications  Medication Dose Route Frequency Provider Last Rate Last Admin   acetaminophen (TYLENOL) tablet 500 mg  500 mg Oral Q6H PRN Carollee Leitz, MD   500 mg at 03/06/21 2131   aspirin EC tablet 81 mg  81 mg Oral QPC breakfast Carollee Leitz, MD   81 mg at 03/07/21 1008  atorvastatin (LIPITOR) tablet 20 mg  20 mg Oral Daily Carollee Leitz, MD   20 mg at 03/07/21 1008   azithromycin (ZITHROMAX) tablet 500 mg  500 mg Oral Once Carollee Leitz, MD       Derrill Memo ON 03/08/2021] bumetanide (BUMEX) tablet 0.5 mg  0.5 mg Oral Daily Tolia, Sunit, DO       cefdinir (OMNICEF) capsule 300 mg  300 mg Oral q1800 [place], Donalynn Furlong, RPH       dorzolamide (TRUSOPT) 2 % ophthalmic solution 2 drop  2 drop Both Eyes BID Carollee Leitz, MD   2 drop at 03/07/21 1016   enoxaparin (LOVENOX) injection 30 mg  30 mg Subcutaneous Q24H Corky Sox, MD       isosorbide mononitrate (IMDUR) 24 hr tablet 60 mg  60 mg Oral Daily Carollee Leitz, MD   60 mg at 03/07/21 1007   latanoprost (XALATAN) 0.005 % ophthalmic solution 2 drop  2 drop Both Eyes QHS Carollee Leitz, MD   2 drop at 03/06/21 2133   levothyroxine (SYNTHROID) tablet 75 mcg  75 mcg Oral QAC breakfast Carollee Leitz, MD   75 mcg at 03/07/21 0604   metoprolol succinate (TOPROL-XL) 24 hr tablet 12.5 mg  12.5 mg Oral Daily Corky Sox, MD   12.5 mg at 03/07/21 1008   nitroGLYCERIN (NITROSTAT) SL tablet 0.4 mg  0.4 mg Sublingual Q5 min PRN Carollee Leitz, MD       potassium chloride (KLOR-CON) packet 20 mEq  20 mEq Oral Daily McDiarmid, Blane Ohara, MD   20 mEq at 03/07/21 1007   ranolazine (RANEXA) 12 hr tablet 500 mg  500 mg Oral Daily [place], Celeste C, PA-C   500 mg at 03/07/21 1007     Discharge Medications: Please see discharge summary for a list of discharge medications.  Relevant Imaging Results:  Relevant Lab Results:   Additional Information SSN [id number]  Emeterio Reeve, LCSW

## 2021-03-07 NOTE — Progress Notes (Addendum)
FPTS Brief Progress Note  S:Pt lying comfortably in bed.  Spoke with nurse who states pt has no complaints. Has had 3 loose BMs over night.    O: BP (!) 105/54 (BP Location: Left Arm)   Pulse 72   Temp 97.8 F (36.6 C) (Oral)   Resp 19   Ht 4\' 8"  (1.422 m)   Wt 45 kg   SpO2 99%   BMI 22.26 kg/m   General: 87 y.o. female lying comfortably in bed, NAD Resp: normal WOB on 2L, SpO2 96%  A/P: -continue diuresis -CTX d/c'd d/t less concern for CAP, Azithromycin cont. -continue plan in day teams progress note  Precious Gilding, DO 03/07/2021, 4:36 AM PGY-1, [facility] Night Resident  Please page [phone] with questions.

## 2021-03-07 NOTE — Progress Notes (Signed)
Pharmacy - Cefdinir for CAP  A/P: Oxygen requirement today. Repeat CXR with bilateral lower lobe airspace opacities. She is afebrile, WBC are elevated at 12.9, PCT is 0.58 which indicates possible infection. Known CKD, SCr 1.2, CrCl ~18 ml/min.  PCN allergy listed although reaction is just to filler ingredients and not PCN. I do not see any penicillins given per Epic review. She has had cephalosporins.  Cefdinir 300 mg PO daily for 5 days Monitor clinical progress  Thank you for involving pharmacy in this patient's care.  Renold Genta, PharmD, BCPS Clinical Pharmacist Clinical phone for 03/07/2021 until 3p is [phone] 03/07/2021 3:02 PM  **Pharmacist phone directory can be found on [email or web address] listed under [place]**

## 2021-03-07 NOTE — Telephone Encounter (Signed)
Location of hospitalization: [place] Reason for hospitalization:  Date of discharge:  Date of first communication with patient: today Person contacting patient: Me Current symptoms:  Do you understand why you were in the Hospital: Yes Questions regarding discharge instructions: None Where were you discharged to: Home Medications reviewed: Yes Allergies reviewed: Yes Dietary changes reviewed: Yes. Discussed low fat and low salt diet.  Referals reviewed: NA Activities of Daily Living: Able to with mild limitations Any transportation issues/concerns: None Any patient concerns: None Confirmed importance & date/time of Follow up appt: Yes Confirmed with patient if condition begins to worsen call. Pt was given the office number and encouraged to call back with questions or concerns: Yes   Called PT. Pt still currently admitted.

## 2021-03-07 NOTE — Progress Notes (Signed)
   03/07/21 1050  Mobility  Activity Refused mobility (Pt stated she already ambulated today and declined initially. With education + max encouragement pt agreed to particpate but requested to be seen in the afternoon d/t exhaustion. Will plan to see this afternoon)

## 2021-03-07 NOTE — Progress Notes (Signed)
  Nurse paged, pt is having a few loose stools with slight upset stomach, thinks "dinner didn't agree with me". Pepto bismol was ordered.

## 2021-03-07 NOTE — Telephone Encounter (Signed)
She is admitted under primary care, they are already working on it.

## 2021-03-08 ENCOUNTER — Other Ambulatory Visit: Payer: Self-pay | Admitting: Family Medicine

## 2021-03-08 DIAGNOSIS — J189 Pneumonia, unspecified organism: Secondary | ICD-10-CM | POA: Diagnosis not present

## 2021-03-08 DIAGNOSIS — I340 Nonrheumatic mitral (valve) insufficiency: Secondary | ICD-10-CM | POA: Diagnosis not present

## 2021-03-08 LAB — BASIC METABOLIC PANEL
Anion gap: 11 (ref 5–15)
BUN: 31 mg/dL — ABNORMAL HIGH (ref 8–23)
CO2: 21 mmol/L — ABNORMAL LOW (ref 22–32)
Calcium: 9.1 mg/dL (ref 8.9–10.3)
Chloride: 94 mmol/L — ABNORMAL LOW (ref 98–111)
Creatinine, Ser: 1.28 mg/dL — ABNORMAL HIGH (ref 0.44–1.00)
GFR, Estimated: 39 mL/min — ABNORMAL LOW (ref >60)
Glucose, Bld: 108 mg/dL — ABNORMAL HIGH (ref 70–99)
Potassium: 4.6 mmol/L (ref 3.5–5.1)
Sodium: 126 mmol/L — ABNORMAL LOW (ref 135–145)

## 2021-03-08 LAB — BRAIN NATRIURETIC PEPTIDE: B Natriuretic Peptide: 1124.4 pg/mL — ABNORMAL HIGH (ref 0.0–100.0)

## 2021-03-08 MED ORDER — BUMETANIDE 1 MG PO TABS: 1.0000 mg | ORAL_TABLET | Freq: Every day | ORAL | Status: DC

## 2021-03-08 MED ORDER — HYDRALAZINE HCL 25 MG PO TABS
25.0000 mg | ORAL_TABLET | Freq: Three times a day (TID) | ORAL | Status: DC
  Administered 2021-03-08: 25 mg via ORAL
  Filled 2021-03-08 (×2): qty 1

## 2021-03-08 MED ORDER — BUMETANIDE 0.5 MG PO TABS: 0.5000 mg | ORAL_TABLET | Freq: Every day | ORAL | Status: DC

## 2021-03-08 MED ORDER — POTASSIUM CHLORIDE 20 MEQ PO PACK
20.0000 meq | PACK | Freq: Two times a day (BID) | ORAL | Status: DC
  Administered 2021-03-08 – 2021-03-11 (×6): 20 meq via ORAL
  Filled 2021-03-08 (×6): qty 1

## 2021-03-08 MED ORDER — BUMETANIDE 1 MG PO TABS
1.0000 mg | ORAL_TABLET | Freq: Every day | ORAL | Status: AC
  Administered 2021-03-08: 1 mg via ORAL
  Filled 2021-03-08: qty 1

## 2021-03-08 MED ORDER — ISOSORBIDE DINITRATE 10 MG PO TABS
30.0000 mg | ORAL_TABLET | Freq: Three times a day (TID) | ORAL | Status: DC
  Administered 2021-03-08 – 2021-03-11 (×9): 30 mg via ORAL
  Filled 2021-03-08 (×9): qty 3

## 2021-03-08 MED ORDER — FUROSEMIDE 10 MG/ML IJ SOLN
40.0000 mg | Freq: Two times a day (BID) | INTRAMUSCULAR | Status: AC
  Administered 2021-03-08 – 2021-03-09 (×4): 40 mg via INTRAVENOUS
  Filled 2021-03-08 (×4): qty 4

## 2021-03-08 NOTE — TOC Initial Note (Signed)
Transition of Care [facility]) - Initial/Assessment Note    Patient Details  Name: Candace Ross MRN: [id number] Date of Birth: [birth year]  [facility]) CM/SW Contact:    Emeterio Reeve, LCSW Phone Number: 03/08/2021, 4:15 PM  Clinical Narrative:                  CSW received SNF consult. CSW met with pt at bedside. CSW introduced self and explained role at the hospital. Pt reports that PTA she lived at home alone. Pt reports she is independent with mobility and ADL's. Pt reports she cooks and cleans and does all of her household tasks herself.   CSW reviewed PT/OT recommendations for SNF. Pt reports she is OK with going to SNF for rehab. Pt gave CSW permission to fax out to facilities in the area. Pt has no preference of facility at this time. CSW gave pt medicare.gov rating list to review. CSW explained insurance auth process. Pt reports they are covid vaccinated.  CSW will continue to follow.   Expected Discharge Plan: Skilled Nursing Facility Barriers to Discharge: Continued Medical Work up, Ship broker   Patient Goals and CMS Choice Patient states their goals for this hospitalization and ongoing recovery are:: To get stronger and return home CMS Medicare.gov Compare Post Acute Care list provided to:: Patient Choice offered to / list presented to : Patient  Expected Discharge Plan and Services Expected Discharge Plan: [place]       Living arrangements for the past 2 months: Single Family Home                                      Prior Living Arrangements/Services Living arrangements for the past 2 months: Single Family Home Lives with:: Self Patient language and need for interpreter reviewed:: Yes Do you feel safe going back to the place where you live?: Yes      Need for Family Participation in Patient Care: Yes (Comment) Care giver support system in place?: No (comment)   Criminal Activity/Legal Involvement Pertinent  to Current Situation/Hospitalization: No - Comment as needed  Activities of Daily Living Home Assistive Devices/Equipment: Gilford Rile (specify type) (4 wheel) ADL Screening (condition at time of admission) Patient's cognitive ability adequate to safely complete daily activities?: No Is the patient deaf or have difficulty hearing?: Yes Does the patient have difficulty seeing, even when wearing glasses/contacts?: Yes Does the patient have difficulty concentrating, remembering, or making decisions?: No Patient able to express need for assistance with ADLs?: Yes Does the patient have difficulty dressing or bathing?: No Independently performs ADLs?: Yes (appropriate for developmental age) Does the patient have difficulty walking or climbing stairs?: Yes Weakness of Legs: None Weakness of Arms/Hands: None  Permission Sought/Granted Permission sought to share information with : Chartered certified accountant granted to share information with : Yes, Verbal Permission Granted     Permission granted to share info w AGENCY: SNF        Emotional Assessment Appearance:: Appears stated age Attitude/Demeanor/Rapport: Engaged Affect (typically observed): Appropriate Orientation: : Oriented to Self, Oriented to Place, Oriented to  Time, Oriented to Situation Alcohol / Substance Use: Not Applicable Psych Involvement: No (comment)  Admission diagnosis:  Dyspnea [R06.00] Acute on chronic combined systolic and diastolic congestive heart failure ([place]) [I50.43] Community acquired pneumonia of right lower lobe of lung [J18.9] Community acquired pneumonia, unspecified laterality [J18.9] Patient Active Problem  List   Diagnosis Date Noted   Macular degeneration 03/07/2021   Visual impairment 03/07/2021   Hard of hearing 03/07/2021   Chest pain 03/06/2021   Community acquired pneumonia    Dyspnea 03/05/2021   Acute on chronic heart failure with preserved ejection fraction (HFpEF) ([place])     Atherosclerosis of native coronary artery of native heart without angina pectoris    History of coronary angioplasty with insertion of stent    Severe mitral regurgitation    LBBB (left bundle branch block)    Asymptomatic bilateral carotid artery stenosis    Hyponatremia    Leukocytosis    Hypertensive heart and kidney disease with HF and with CKD stage III ([place])    Mixed hyperlipidemia    Acute on chronic diastolic heart failure ([place]) 11/29/2018   Benign hypertension 11/29/2018   Shortness of breath 11/29/2018   Acute on chronic diastolic CHF (congestive heart failure) ([place]) 11/29/2018   Spinal stenosis of lumbar region with neurogenic claudication 01/23/2017   Scoliosis of thoracolumbar spine 01/23/2017   Lumbar radiculopathy 01/23/2017   CAD (coronary artery disease), native coronary artery 12/18/2014   Angina pectoris ([place]) 12/17/2014   Cancer of central portion of female breast ([place]) 10/20/2011   PCP:  Buzzy Han, MD Pharmacy:   [facility] - [place], [place] [address] Phone: [phone] Fax: [phone]     Social Determinants of Health (SDOH) Interventions    Readmission Risk Interventions No flowsheet data found.  Emeterio Reeve, LCSW Clinical Social Worker

## 2021-03-08 NOTE — Progress Notes (Addendum)
FPTS Brief Progress Note  S: Nurse paged that pt is in A. fib and is asymptomatic.  Pt denies any pain and states she feels well this evening.  She is very talkative tonight. Nurse called earlier in the evening as pt had a low diastolic BP and was told to hold night time dose of hydralazine.    O: BP (!) 120/58   Pulse 67   Temp 98.2 F (36.8 C) (Oral)   Resp (!) 24   Ht 4\' 8"  (1.422 m)   Wt 45.9 kg   SpO2 97%   BMI 22.67 kg/m   PE General: well appearing 87 y.o. female, sitting up in bed, NAD, eating a snack Cardio: blowing holosystolic murmur. Monitor is reading a fib but HR sounds regular in rhythm and rate Resp: pt is very talkative, speaking in complete sentences on 1.5 L with  SpO2 of 92%, normal WOB  A/P: Pt does not have history of a fib that can be found in chart review. Pt is currently on lovenox for VTE PPx and is already on metoprolol. CHA2DS2-VASc score of 5, HAS-BLED 2 -morning EKG -continuous cardiac monitoring -night time hydralazine held d/t soft Bps (118/50) -Continue to follow plan outlined in day teams progress note. - Orders reviewed. Labs for AM ordered, which was adjusted as needed.    Precious Gilding, DO 03/08/2021, 10:31 PM PGY-1, [facility] Night Resident  Please page [phone] with questions.

## 2021-03-08 NOTE — Telephone Encounter (Signed)
Called and spoke to pt, pt is aware.

## 2021-03-08 NOTE — Progress Notes (Addendum)
Subjective:  Patient states that she is feeling better today.  Intake/Output from previous day:  I/O last 3 completed shifts: In: 600 [P.O.:600] Out: 500 [Urine:500] Total I/O In: 480 [P.O.:480] Out: 150 [Urine:150]  Blood pressure (!) 110/56, pulse 77, temperature 98.3 F (36.8 C), temperature source Oral, resp. rate 20, height 4' 8"  (1.422 m), weight 45.9 kg, SpO2 93 %. Physical Exam Constitutional:      Appearance: She is normal weight.  Eyes:     Conjunctiva/sclera: Conjunctivae normal.  Neck:     Vascular: JVD present. No carotid bruit.  Cardiovascular:     Rate and Rhythm: Normal rate and regular rhythm.     Pulses:          Dorsalis pedis pulses are 1+ on the right side and 1+ on the left side.       Posterior tibial pulses are 1+ on the right side.     Heart sounds: Murmur heard.  High-pitched blowing holosystolic murmur is present with a grade of 3/6 at the apex.  Pulmonary:     Breath sounds: Examination of the right-lower field reveals rales. Examination of the left-lower field reveals rales. Rales present.  Musculoskeletal:     Right lower leg: No edema.     Left lower leg: No edema.  Skin:    Capillary Refill: Capillary refill takes less than 2 seconds.  Neurological:     General: No focal deficit present.     Mental Status: She is alert.    Lab Results: BMP BNP (last 3 results) Recent Labs    03/05/21 0502 03/08/21 0245  BNP 1,232.5* 1,124.4*    BMP Latest Ref Rng & Units 03/08/2021 03/07/2021 03/06/2021  Glucose 70 - 99 mg/dL 108(H) 111(H) 119(H)  BUN 8 - 23 mg/dL 31(H) 29(H) 27(H)  Creatinine 0.44 - 1.00 mg/dL 1.28(H) 1.20(H) 1.06(H)  BUN/Creat Ratio 12 - 28 - - -  Sodium 135 - 145 mmol/L 126(L) 130(L) 131(L)  Potassium 3.5 - 5.1 mmol/L 4.6 4.5 3.9  Chloride 98 - 111 mmol/L 94(L) 97(L) 98  CO2 22 - 32 mmol/L 21(L) 24 22  Calcium 8.9 - 10.3 mg/dL 9.1 9.2 9.0   Hepatic Function Latest Ref Rng & Units 03/06/2021 03/05/2021 05/06/2020  Total  Protein 6.5 - 8.1 g/dL 6.4(L) 6.5 7.2  Albumin 3.5 - 5.0 g/dL 3.3(L) 3.6 4.5  AST 15 - 41 U/L 17 24 14   ALT 0 - 44 U/L 10 11 12   Alk Phosphatase 38 - 126 U/L 56 61 100  Total Bilirubin 0.3 - 1.2 mg/dL 1.8(H) 1.5(H) 0.7  Bilirubin, Direct 0.0 - 0.2 mg/dL - 0.3(H) -   CBC Latest Ref Rng & Units 03/07/2021 03/06/2021 03/05/2021  WBC 4.0 - 10.5 K/uL 12.9(H) 15.5(H) 13.7(H)  Hemoglobin 12.0 - 15.0 g/dL 11.6(L) 10.9(L) 11.2(L)  Hematocrit 36.0 - 46.0 % 35.7(L) 34.2(L) 35.0(L)  Platelets 150 - 400 K/uL 201 177 233   Lipid Panel     Component Value Date/Time   CHOL 157 05/06/2020 1354   TRIG 136 05/06/2020 1354   HDL 65 05/06/2020 1354   CHOLHDL 3.4 01/22/2016 0447   VLDL 26 01/22/2016 0447   LDLCALC 69 05/06/2020 1354   Cardiac Panel (last 3 results) No results for input(s): CKTOTAL, CKMB, TROPONINI, RELINDX in the last 72 hours.  BNP (last 3 results) Recent Labs    03/05/21 0502 03/08/21 0245  BNP 1,232.5* 1,124.4*    ProBNP (last 3 results) No results for input(s): PROBNP in the last  8760 hours.  HEMOGLOBIN A1C No results found for: HGBA1C, MPG TSH Recent Labs    03/05/21 0502  TSH 2.162   Imaging: DG Chest 2 View  Result Date: 03/07/2021 CLINICAL DATA:  Shortness of breath EXAM: CHEST - 2 VIEW COMPARISON:  03/05/2021 FINDINGS: Cardiomegaly with vascular congestion. Bilateral lower lobe airspace opacities. Suspect small effusions. Aortic atherosclerosis. No acute bony abnormality. IMPRESSION: Cardiomegaly with vascular congestion and increasing bilateral lower lobe airspace opacities which could reflect edema. Small bilateral effusions. Electronically Signed   By: Rolm Baptise M.D.   On: 03/07/2021 12:26   Cardiac Studies: EKG: 03/05/2021: Normal sinus rhythm, 78 bpm, LAE, left axis, left bundle branch block, ST-T changes most likely secondary to LBBB.  Echocardiogram 03/06/2021:   1. Left ventricular ejection fraction, by estimation, is 50 to 55%. The  left ventricle  has low normal function. The left ventricle has no regional wall motion abnormalities. There is moderate asymmetric left ventricular hypertrophy of the basal-septal segment. Left ventricular diastolic parameters are consistent with Grade III diastolic dysfunction (restrictive).   2. Right ventricular systolic function is mildly reduced. The right  ventricular size is mildly enlarged. There is moderately elevated  pulmonary artery systolic pressure. The estimated right ventricular  systolic pressure is 03.5 mmHg.   3. Left atrial size was severely dilated.   4. Right atrial size was mildly dilated.   5. The mitral valve is degenerative. Severe mitral valve regurgitation. Moderate mitral annular calcification.   6. Tricuspid valve regurgitation is moderate.   7. The aortic valve is tricuspid. Aortic valve regurgitation is trivial.  Mild to moderate aortic valve sclerosis/calcification is present, without any evidence of aortic stenosis.   8. The inferior vena cava is dilated in size with <50% respiratory  variability, suggesting right atrial pressure of 15 mmHg.    Stress Testing:  Myocardial perfusion imaging 01/23/2016: 1. There is a large fixed defect involving the mid and distal anterior wall, anterior septum and anterior lateral wall. No reversibility identified. 2. There is hypokinesis involving the anterior septum, anterior wall and anterior lateral wall which corresponds to the large fixed defect. 3. Left ventricular ejection fraction 60% 4. Non invasive risk stratification*: low   Heart Catheterization: 12/18/2014: RPDA lesion, 70% stenosed. PTCA and stenting of the proximal and mid circumflex with a 3.0 x 38 mm resolute integrity DES for 90% stenosis, stenting of the mid RCA with a 2.75 x 26 mm resolute integrity DES for 99% stenosis, both reduced to 0%. LM lesion, 10% stenosed. LVEF 45% with mid to distal anterolateral akinesis, inferolateral mild hypokinesis.   Scheduled  Meds:  aspirin EC  81 mg Oral QPC breakfast   atorvastatin  20 mg Oral Daily   bumetanide  0.5 mg Oral Daily   cefdinir  300 mg Oral q1800   dorzolamide  2 drop Both Eyes BID   enoxaparin (LOVENOX) injection  30 mg Subcutaneous Q24H   isosorbide mononitrate  60 mg Oral Daily   latanoprost  2 drop Both Eyes QHS   levothyroxine  75 mcg Oral QAC breakfast   metoprolol succinate  12.5 mg Oral Daily   potassium chloride  20 mEq Oral Daily   ranolazine  500 mg Oral Daily   Continuous Infusions: PRN Meds:.acetaminophen, bismuth subsalicylate, nitroGLYCERIN  Assessment/Plan:  1.  Acute on chronic diastolic heart failure 2.  Severe mitral regurgitation 3.  CAD of native vessel without angina pectoris  Recommendation: Patient is still volume overloaded.  Has elevated JVD, bibasilar crackles  in her lungs, BNP is also elevated.  We will switch her back to IV Lasix today, twice daily 40 mg and will increase her potassium supplements as well and check BMP in the morning along with BMP.  She has got severe mitral regurgitation, not a candidate for surgical intervention given her advanced age.  I had previously tried to perform TEE and eventually consider MitraClip, however patient canceled many of the appointments and this never happened.  She also does not wish to proceed with any surgical interventions.  We will continue to follow.  With regard to CAD, initial presentation of chest pain was atypical.  Troponins are flat.  Do not suspect ACS.   I will discontinue isosorbide mononitrate and switch her to isosorbide dinitrate 30 mg 3 times daily along with hydralazine 25 mg 3 times daily.  Patient not on an ACE inhibitor or ARB due to acute renal insufficiency.    Adrian Prows, MD, [facility] 03/08/2021, 6:10 AM Office: [phone] Fax: [phone] Pager: [phone]

## 2021-03-08 NOTE — Telephone Encounter (Signed)
Patient is still currently admitted in the hospital.

## 2021-03-08 NOTE — Progress Notes (Signed)
Physical Therapy Treatment Patient Details Name: CARNETTA LOSADA MRN: [id number] DOB: July 31, 1926 Today's Date: 03/08/2021   History of Present Illness Pt is a 87 y.o. who presents with acute on chronic exacerbation of HFpEF. Significant PMH: CAD s/p PCI, HFpEF, severe mitral regurgitation, HTN, left bundle branch block, asymptomatic CAD.    PT Comments    Pt assisted to BSC x 2, washed hands and face at the sink and ambulated 120' today. Pt requiring minA for standing balance while doing ADLs. Pt with improved ambulation tolerance. Pt maintained SpO2 >91% on RA until pt began to amb and s/p 50' pt dropped to 77%, pt placed on 2LO2 via [place], s/p 1 min was up into 90s. Pt remains appropriate for ST-SNF to achieve safe mod I level of function for safe transition home alone.    Recommendations for follow up therapy are one component of a multi-disciplinary discharge planning process, led by the attending physician.  Recommendations may be updated based on patient status, additional functional criteria and insurance authorization.  Follow Up Recommendations  SNF     Equipment Recommendations  3in1 (PT)    Recommendations for Other Services       Precautions / Restrictions Precautions Precautions: Fall Precaution Comments: watch SPO2, drops on RA with mobility, okay at rest Restrictions Weight Bearing Restrictions: No     Mobility  Bed Mobility               General bed mobility comments: pt sitting EOB upon PT arrival    Transfers Overall transfer level: Needs assistance Equipment used: 1 person hand held assist Transfers: Sit to/from Stand;Stand Pivot Transfers Sit to Stand: Min assist Stand pivot transfers: Min assist       General transfer comment: pt provided L HHA and used R hand on BSC to complete transfer to [facility]  Ambulation/Gait Ambulation/Gait assistance: Min guard Gait Distance (Feet): 120 Feet Assistive device: Rolling walker (2 wheeled) Gait  Pattern/deviations: Step-through pattern;Decreased stride length;Trunk flexed Gait velocity: dec Gait velocity interpretation: <1.8 ft/sec, indicate of risk for recurrent falls General Gait Details: slow pace, verbal cues to stay in walker, pt with noted SOB on RA, SpO2 at 77%, pt placed on 2LO2 via [place] and took a standing rest break with some deep breaths and improved to 91% on 2LO2 via [place] and walked back to room   Stairs             Wheelchair Mobility    Modified Rankin (Stroke Patients Only)       Balance Overall balance assessment: Needs assistance Sitting-balance support: Feet supported;No upper extremity supported Sitting balance-Leahy Scale: Good     Standing balance support: During functional activity Standing balance-Leahy Scale: Fair Standing balance comment: pt requiring min posterior support while standing at sink for 6 minutes to wash face and bottom at sink, no physical assist for ADL but need minA to maintain balance, pt would lean on counter with elbows when rinsing out wash cloth in sink                            Cognition Arousal/Alertness: Awake/alert Behavior During Therapy: WFL for tasks assessed/performed Overall Cognitive Status: Within Functional Limits for tasks assessed                                 General Comments: pt initially very upset that she called  1.25 hours ago to go to the bathroom and no one came to help her to the [facility], offered to take pt to bathroom, pt very appreciative and then presenting with tangential speech requiring frequent re-directing, suspect this is baseline      Exercises      General Comments General comments (skin integrity, edema, etc.): SpO2 >91% on RA t/o session with exception of ambulation where it dropped to 77% requiring 2lO2 via [place] to complete amb      Pertinent Vitals/Pain Pain Assessment: No/denies pain    Home Living                      Prior Function             PT Goals (current goals can now be found in the care plan section) Progress towards PT goals: Progressing toward goals    Frequency    Min 3X/week      PT Plan Current plan remains appropriate    Co-evaluation              AM-PAC PT "6 Clicks" Mobility   Outcome Measure  Help needed turning from your back to your side while in a flat bed without using bedrails?: None Help needed moving from lying on your back to sitting on the side of a flat bed without using bedrails?: A Little Help needed moving to and from a bed to a chair (including a wheelchair)?: A Little Help needed standing up from a chair using your arms (e.g., wheelchair or bedside chair)?: A Little Help needed to walk in hospital room?: A Little Help needed climbing 3-5 steps with a railing? : A Lot 6 Click Score: 18    End of Session Equipment Utilized During Treatment: Gait belt;Oxygen Activity Tolerance: Patient tolerated treatment well Patient left: in chair;with call bell/phone within reach;with chair alarm set Nurse Communication: Mobility status PT Visit Diagnosis: Unsteadiness on feet (R26.81);Muscle weakness (generalized) (M62.81);Difficulty in walking, not elsewhere classified (R26.2)     Time: 7494-4967 PT Time Calculation (min) (ACUTE ONLY): 36 min  Charges:  $Gait Training: 8-22 mins $Therapeutic Activity: 8-22 mins                     Kittie Plater, PT, DPT Acute Rehabilitation Services Pager #: [phone] Office #: [phone]    Berline Lopes 03/08/2021, 9:06 AM

## 2021-03-08 NOTE — Progress Notes (Signed)
Family Medicine Teaching Service Daily Progress Note Intern Pager: [phone]  Patient name: Candace Ross Medical record number: [id number] Date of birth: [birth year] Age: 87 y.o. Gender: female  Primary Care Provider: Buzzy Han, MD Consultants: cardiology Code Status: FULL  Pt Overview and Major Events to Date:  10/8: admitted  Assessment and Plan: Candace Ross is a 87 y.o. female presenting with chest pain and dyspnea. PMH is significant for CAD s/p PCI in 2016, HFpEF, mitral regurgitation, LBBB,  HTN ,spinal stenosis w/ claudication, CKD stage III, Hypothyroidism, scoliosis and breast cancer.  Acute hypoxic respiratory failure 2/2 HFpEF exacerbation vs CAP Procal of 0.58 and stable CXR obtained yesterday. Patient started on 5-day course of cefdinir. Patient satting well on RA yesterday and overnight, however, desatted after working with PT. Placed back on 2L . Likely will be able to wean while resting today but will require some O2 for ambulation. Cr up over the last 2 days to 1.28 and BNP of 1.1K (essentially unchanged from admission). Na of 126, likely 2/2 to continued volume overload. SBP 114-120 but MAPs lower from 70-81. UOP of 150 mL but there are 3x unmeasured occurrences.  -s/p azithromycin, continue Cefdinir for 5 day course (10/10-10/14) -Bumex 1 mg again today -Cards recommending an additional 40 mg IV BID today -Tylenol 500mg  Q6H PRN -Strict Is/Os -Daily weight  -Routine vitals -PT/OT  Chest pain, CAD s/p PCI in 2016 Patient reports minimal CP this AM, at baseline.  -Continue home ASA 81 mg and Imdur 60 mg daily -Continue home medication of Atorvastatin 20 mg -Continue sublingual nitroglycerine -Added Ranexa 500 mg   Hypertension Home meds of Losartan 50 and Toprol 25 mg BID. Had been holding meds for soft BP. -Toprol 12.5 daily -Hold losartan   CKD 3a Bl cr of 1-1.13. Up to 1.32 on admission. Currently 1.28.   -Daily BMP    Hyponatremia Na of 126 this AM, may be 2/2 HF exacerbation. Patient is currently asymptomatic.  -CTM with daily BMP   Hx of Breast Cancer Was diagnosed with left breast invasive ductal Carcinoma in 2013 treated with needle localized lumpectomy.    FEN/GI: heart healthy PPx: Lovenox Dispo:PT/OT recommends SNF, patient accepting currently   FEN/GI: heart healthy PPx: Lovenox Dispo:SNF  Subjective:  On interview this morning patient reports minimal chest pain and improved SoB. She reports regular Bms and occasional phlegm production.  Objective: Temp:  [97.9 F (36.6 C)-98.3 F (36.8 C)] 97.9 F (36.6 C) (10/11 0838) Pulse Rate:  [73-77] 77 (10/11 0302) Resp:  [20-22] 20 (10/11 0302) BP: (110-114)/(52-56) 110/56 (10/11 0302) SpO2:  [93 %-95 %] 93 % (10/11 0302) Weight:  [101 lb 1.6 oz (45.9 kg)] 101 lb 1.6 oz (45.9 kg) (10/11 0009) Physical Exam Vitals reviewed.  Cardiovascular:     Rate and Rhythm: Normal rate and regular rhythm.     Heart sounds: Normal heart sounds.     Comments: Minimal pedal edema Pulmonary:     Effort: Pulmonary effort is normal.     Comments: Bibasilar crackles Skin:    General: Skin is warm and dry.  Neurological:     Mental Status: She is alert.     Laboratory: Recent Labs  Lab 03/05/21 0502 03/06/21 0406 03/07/21 1058  WBC 13.7* 15.5* 12.9*  HGB 11.2* 10.9* 11.6*  HCT 35.0* 34.2* 35.7*  PLT 233 177 201   Recent Labs  Lab 03/05/21 1246 03/06/21 0406 03/07/21 0334 03/08/21 0245  NA  --  131* 130* 126*  K  --  3.9 4.5 4.6  CL  --  98 97* 94*  CO2  --  22 24 21*  BUN  --  27* 29* 31*  CREATININE  --  1.06* 1.20* 1.28*  CALCIUM  --  9.0 9.2 9.1  PROT 6.5 6.4*  --   --   BILITOT 1.5* 1.8*  --   --   ALKPHOS 61 56  --   --   ALT 11 10  --   --   AST 24 17  --   --   GLUCOSE  --  119* 111* 108*    Imaging/Diagnostic Tests: CXR, Procal as above  Corky Sox PGY-1 [place] Intern pager: [phone], text pages welcome

## 2021-03-09 ENCOUNTER — Inpatient Hospital Stay (HOSPITAL_COMMUNITY): Payer: Medicare HMO

## 2021-03-09 ENCOUNTER — Encounter (HOSPITAL_COMMUNITY): Payer: Self-pay | Admitting: Family Medicine

## 2021-03-09 ENCOUNTER — Telehealth: Payer: Self-pay | Admitting: Cardiology

## 2021-03-09 DIAGNOSIS — I4891 Unspecified atrial fibrillation: Secondary | ICD-10-CM

## 2021-03-09 DIAGNOSIS — I25119 Atherosclerotic heart disease of native coronary artery with unspecified angina pectoris: Secondary | ICD-10-CM

## 2021-03-09 DIAGNOSIS — I482 Chronic atrial fibrillation, unspecified: Secondary | ICD-10-CM | POA: Diagnosis not present

## 2021-03-09 DIAGNOSIS — H3530 Unspecified macular degeneration: Secondary | ICD-10-CM

## 2021-03-09 DIAGNOSIS — I259 Chronic ischemic heart disease, unspecified: Secondary | ICD-10-CM | POA: Diagnosis not present

## 2021-03-09 DIAGNOSIS — I5033 Acute on chronic diastolic (congestive) heart failure: Secondary | ICD-10-CM | POA: Diagnosis not present

## 2021-03-09 DIAGNOSIS — H547 Unspecified visual loss: Secondary | ICD-10-CM

## 2021-03-09 HISTORY — DX: Unspecified atrial fibrillation: I48.91

## 2021-03-09 LAB — BASIC METABOLIC PANEL
Anion gap: 10 (ref 5–15)
BUN: 32 mg/dL — ABNORMAL HIGH (ref 8–23)
CO2: 22 mmol/L (ref 22–32)
Calcium: 8.7 mg/dL — ABNORMAL LOW (ref 8.9–10.3)
Chloride: 92 mmol/L — ABNORMAL LOW (ref 98–111)
Creatinine, Ser: 1.19 mg/dL — ABNORMAL HIGH (ref 0.44–1.00)
GFR, Estimated: 43 mL/min — ABNORMAL LOW (ref >60)
Glucose, Bld: 137 mg/dL — ABNORMAL HIGH (ref 70–99)
Potassium: 4.4 mmol/L (ref 3.5–5.1)
Sodium: 124 mmol/L — ABNORMAL LOW (ref 135–145)

## 2021-03-09 LAB — CBC
HCT: 32.4 % — ABNORMAL LOW (ref 36.0–46.0)
Hemoglobin: 10.7 g/dL — ABNORMAL LOW (ref 12.0–15.0)
MCH: 28.8 pg (ref 26.0–34.0)
MCHC: 33 g/dL (ref 30.0–36.0)
MCV: 87.1 fL (ref 80.0–100.0)
Platelets: 237 10*3/uL (ref 150–400)
RBC: 3.72 MIL/uL — ABNORMAL LOW (ref 3.87–5.11)
RDW: 14.5 % (ref 11.5–15.5)
WBC: 10.2 10*3/uL (ref 4.0–10.5)
nRBC: 0 % (ref 0.0–0.2)

## 2021-03-09 LAB — TROPONIN I (HIGH SENSITIVITY)
Troponin I (High Sensitivity): 107 ng/L (ref <18)
Troponin I (High Sensitivity): 99 ng/L — ABNORMAL HIGH (ref <18)

## 2021-03-09 LAB — HEPARIN LEVEL (UNFRACTIONATED): Heparin Unfractionated: <0.1 IU/mL — ABNORMAL LOW (ref 0.30–0.70)

## 2021-03-09 LAB — MAGNESIUM: Magnesium: 2.1 mg/dL (ref 1.7–2.4)

## 2021-03-09 LAB — BRAIN NATRIURETIC PEPTIDE: B Natriuretic Peptide: 1573.4 pg/mL — ABNORMAL HIGH (ref 0.0–100.0)

## 2021-03-09 MED ORDER — DAPAGLIFLOZIN PROPANEDIOL 10 MG PO TABS
10.0000 mg | ORAL_TABLET | Freq: Every day | ORAL | Status: DC
  Administered 2021-03-09 – 2021-03-11 (×3): 10 mg via ORAL
  Filled 2021-03-09 (×3): qty 1

## 2021-03-09 MED ORDER — ASPIRIN 325 MG PO TABS
325.0000 mg | ORAL_TABLET | Freq: Once | ORAL | Status: AC
  Administered 2021-03-09: 325 mg via ORAL
  Filled 2021-03-09: qty 1

## 2021-03-09 MED ORDER — HEPARIN BOLUS VIA INFUSION
1000.0000 [IU] | Freq: Once | INTRAVENOUS | Status: AC
  Administered 2021-03-09: 1000 [IU] via INTRAVENOUS
  Filled 2021-03-09: qty 1000

## 2021-03-09 MED ORDER — HEPARIN BOLUS VIA INFUSION
1500.0000 [IU] | Freq: Once | INTRAVENOUS | Status: AC
  Administered 2021-03-09: 1500 [IU] via INTRAVENOUS
  Filled 2021-03-09: qty 1500

## 2021-03-09 MED ORDER — HEPARIN (PORCINE) 25000 UT/250ML-% IV SOLN
800.0000 [IU]/h | INTRAVENOUS | Status: DC
  Administered 2021-03-09: 550 [IU]/h via INTRAVENOUS
  Filled 2021-03-09: qty 250

## 2021-03-09 MED ORDER — METOPROLOL SUCCINATE ER 25 MG PO TB24
12.5000 mg | ORAL_TABLET | Freq: Two times a day (BID) | ORAL | Status: DC
  Administered 2021-03-09: 12.5 mg via ORAL
  Filled 2021-03-09: qty 1

## 2021-03-09 NOTE — Progress Notes (Signed)
Family Medicine Teaching Service Daily Progress Note Intern Pager: [phone]  Patient name: Candace Ross Medical record number: [id number] Date of birth: 1926/06/26 Age: 87 y.o. Gender: female  Primary Care Provider: Buzzy Han, MD Consultants: cardiology Code Status: FULL  Pt Overview and Major Events to Date:  10/8: admitted  Assessment and Plan: Candace Ross is a 87 y.o. female presenting with chest pain and dyspnea. PMH is significant for CAD s/p PCI in 2016, HFpEF, mitral regurgitation, LBBB,  HTN ,spinal stenosis w/ claudication, CKD stage III, Hypothyroidism, scoliosis and breast cancer.  Acute hypoxic respiratory failure 2/2 HFpEF exacerbation vs CAP S/P 1 mg Bumex and 40 mg IV Lasix x2 yesterday. Satting on 2L [place] throughout yesterday. This AM patient able to be weaned off O2 at rest. Reduced crackles on auscultation. Cr tolerating heavy diuresis this AM (1.19 with baseline of 1-1.13). BP appropriate: SBP from 104-126, MAPs 67-79. BNP elevated from 1.1K to 1.5K. UOP documented at 1.2L. CXR improved. Given her Na of 124 and BNP she is in severe HF exacerbation. Long term, cardiology feels she has a poor prognosis due to her degenerate mitral valve with serve regurg. Mitraclip has been discussed and the patient does not want to proceed.  -Continue Cefdinir for 5 day course (10/10-10/14) -Lasix 40 mg IV BID again today -K>4, Mg>2: Kcl 20 meq BID -Discontinue Hydralazine 25 mg TID -Strict Is/Os, difficult to obtain thus far -Daily weight  -Routine vitals -PT/OT -Palliative consult -Reach out to Dr. Einar Gip, OP cardiologist  Chest pain, CAD s/p PCI in 2016 4/10 Chest pain last night, relieved with nitro x2. Trop of 107, EKG with afib and LBBB. Repeat trop 99. Likely demand ischemia. Heparin drip started. Currently patient reports only slight chest tightness.  -Continue heparin for the sake of A Fib anticoagulation -Continue home ASA 81 mg and Imdur 60 mg  daily -Continue home medication of Atorvastatin 20 mg -Continue sublingual nitroglycerine -Added Ranexa 500 mg   Atrial Fibrillation Patient in afib starting around 2200 last night. Currently in AF, HR 70-103. No prior Hx. CHADSVASC of 4. Patient desires anticoagulation to avoid stroke. Is on Toprol 25 mg BID for outpatient HTN.   -Heparin drip -Will initiate anticoagulation once off drip -Increase Toprol to 12.5 mg BID  CKD 3a Bl cr of 1-1.13. Up to 1.32 on admission. Currently 1.19.   -Daily BMP   Hyponatremia Na of 124 this AM 2/2 HF exacerbation. Patient is currently asymptomatic.  -CTM with daily BMP   Hx of Breast Cancer Was diagnosed with left breast invasive ductal Carcinoma in 2013 treated with needle localized lumpectomy.    FEN/GI: heart healthy PPx: Lovenox Dispo:PT/OT recommends SNF, patient accepting currently  Subjective:  On interview this morning, patient reports improved CP and SoB. Palliative consult is discussed with the patient and she is amenable.   Objective: Temp:  [97.9 F (36.6 C)-98.2 F (36.8 C)] 97.9 F (36.6 C) (10/12 0353) Pulse Rate:  [67-84] 81 (10/12 0353) Resp:  [18-24] 22 (10/12 0353) BP: (104-126)/(47-61) 111/61 (10/12 0427) SpO2:  [97 %-98 %] 97 % (10/12 0353) Weight:  [101 lb 3.2 oz (45.9 kg)] 101 lb 3.2 oz (45.9 kg) (10/12 0557) Physical Exam Vitals reviewed.  Cardiovascular:     Rate and Rhythm: Normal rate. Rhythm irregular.     Heart sounds: Murmur heard.  Systolic murmur is present.  Pulmonary:     Effort: Pulmonary effort is normal.     Comments: Faint crackles, much improved Abdominal:  General: Bowel sounds are normal.     Palpations: Abdomen is soft.  Neurological:     Mental Status: She is alert.     Laboratory: Recent Labs  Lab 03/05/21 0502 03/06/21 0406 03/07/21 1058  WBC 13.7* 15.5* 12.9*  HGB 11.2* 10.9* 11.6*  HCT 35.0* 34.2* 35.7*  PLT 233 177 201   Recent Labs  Lab 03/05/21 1246  03/06/21 0406 03/07/21 0334 03/08/21 0245 03/09/21 0306  NA  --  131* 130* 126* 124*  K  --  3.9 4.5 4.6 4.4  CL  --  98 97* 94* 92*  CO2  --  22 24 21* 22  BUN  --  27* 29* 31* 32*  CREATININE  --  1.06* 1.20* 1.28* 1.19*  CALCIUM  --  9.0 9.2 9.1 8.7*  PROT 6.5 6.4*  --   --   --   BILITOT 1.5* 1.8*  --   --   --   ALKPHOS 61 56  --   --   --   ALT 11 10  --   --   --   AST 24 17  --   --   --   GLUCOSE  --  119* 111* 108* 137*    Imaging/Diagnostic Tests: None new   Corky Sox PGY-1 [place] Intern pager: [phone], text pages welcome

## 2021-03-09 NOTE — Plan of Care (Signed)

## 2021-03-09 NOTE — Telephone Encounter (Signed)
Yes, she is in the hospital. Will go speak to her again.   TY.

## 2021-03-09 NOTE — Progress Notes (Addendum)
Recurrent chest pain with flat troponin, not suggestive of acute coronary syndrome.  BNP remains very elevated.  She also has developed hyponatremia, dilutional in the setting of severe heart failure.  Continue IV Lasix 40 mg twice daily.  She has severe MR which only has mechanical solution.  Given that her overall wishes are not to proceed any percutaneous procedures, and that she is unlikely to be candidate for any surgeries, her long-term prognosis is not good.  Strongly recommend getting palliative care on board.  We will peripherally follow the patient.   Nigel Mormon, MD Pager: [phone] Office: [phone]

## 2021-03-09 NOTE — Progress Notes (Addendum)
FPTS Brief Progress Note  S: Patient reported to have substernal chest pressure as well as pain in her left upper extremity.  Patient reports having some neck pain that has since resolved.  She reports that after nitroglycerin her chest pain has decreased from 4/10-1/10.     O: BP 111/61   Pulse 81   Temp 97.9 F (36.6 C) (Oral)   Resp (!) 22   Ht 4\' 8"  (1.422 m)   Wt 45.9 kg   SpO2 97%   BMI 22.69 kg/m   Gen: Elderly female sitting upright in bed in no acute distress, very talkative and speaking in clear complete sentences Card: Murmur present, regular rhythm, do not appreciate friction rub, bilateral radial pulses palpable Pulm: Faint crackles in left lung base, appears to have good aeration, patient stable on 1.5 L supplemental O2 Extremities: No lower extremity edema or mottling  A/P: Patient is 87 year old female admitted with chest pain and dyspnea, with recurrence of chest pain that has improved after 2 nitroglycerin sublingual tablets as well as 325 mg of aspirin. -Troponins ordered, elevated from 19 to 107, Hep gtt per pharm consult placed  -Repeat EKG without changes from prior EKG -Patient with new atrial fibrillation, CHA2DS2-VASc score 5-6, has bled score of 2 -We will discuss new atrial fibrillation and recurrent chest pain with cardiology this morning and further discuss anticoagulation - Orders reviewed. Labs for AM ordered, which was adjusted as needed.  - If condition changes, plan includes will start Hep gtt.   Eulis Foster, MD 03/09/2021, 6:02 AM PGY-3, [facility] Family Medicine Night Resident  Please page [phone] with questions.

## 2021-03-09 NOTE — Progress Notes (Signed)
Spoke with Dr Terri Skains Patient wants to consider heart cath.  Continue Heparin drip, BB, and diuresis.   Given ECHO showing preserved EF can add Cardizem drip if RVR. Consult palliative for GOC. He will continue to follow.  Appreciate recommendations  Carollee Leitz, MD Family Medicine Residency

## 2021-03-09 NOTE — Progress Notes (Signed)
ANTICOAGULATION CONSULT NOTE - Initial Consult  Pharmacy Consult for Heparin  Indication: atrial fibrillation, elevated troponin  Allergies  Allergen Reactions   Penicillins Other (See Comments)    NOT Allergy to penicillin  Allergic to FILLER ONLY  Non active ingredient   Other Other (See Comments)    "some medicine they gave me in the hospital, it was a little purple sleeping  pill, to help me sleep didn't work; it made me mean"    Patient Measurements: Height: 4\' 8"  (142.2 cm) Weight: 45.9 kg (101 lb 3.2 oz) IBW/kg (Calculated) : 36.3   Vital Signs: Temp: 97.9 F (36.6 C) (10/12 0353) Temp Source: Oral (10/12 0353) BP: 111/61 (10/12 0427) Pulse Rate: 81 (10/12 0353)  Labs: Recent Labs    03/07/21 0334 03/07/21 1058 03/08/21 0245 03/09/21 0306  HGB  --  11.6*  --   --   HCT  --  35.7*  --   --   PLT  --  201  --   --   CREATININE 1.20*  --  1.28* 1.19*  TROPONINIHS  --   --   --  107*    Estimated Creatinine Clearance: 19.1 mL/min (A) (by C-G formula based on SCr of 1.19 mg/dL (H)).   Medical History: Past Medical History:  Diagnosis Date   Anemia    Back pain    Breast cancer ([place])    Change in stool habits    Dribbling urine    H/O bone density study 2009   Hearing loss    Hypertension    Osteoporosis    Sinus problem    SOB (shortness of breath) on exertion    stairs   Spinal stenosis    Thyroid disease    Wears dentures      Assessment: 87 y/o F with chest pressure/neck pain. Found to be in new onset atrial fibrillation. Mild troponin elevation at 107. Starting heparin. Hgb 11.6. Plts good. Renal function age appropriate. PTA meds reviewed.   Goal of Therapy:  Heparin level 0.3-0.7 units/ml Monitor platelets by anticoagulation protocol: Yes   Plan:  Heparin 1500 units BOLUS Start heparin drip at 550 units/hr 1500 Heparin level Daily CBC/Heparin level Monitor for bleeding  Narda Bonds, PharmD, BCPS Clinical Pharmacist Phone:  [phone]

## 2021-03-09 NOTE — Progress Notes (Signed)
Progress Note  Patient Name: Candace Ross Date of Encounter: 03/09/2021  Attending physician: McDiarmid, Blane Ohara, MD Primary care provider: Buzzy Han, MD Primary Cardiologist: Dr. Einar Gip  Consultant:Manford Sprong Terri Skains, DO  Subjective: Candace Ross is a 87 y.o. female who was seen and examined at bedside  Currently denies any chest pain. Earlier this morning she did have an episode of chest tightness was noted to be in atrial fibrillation on telemetry. She was started on IV heparin drip. Troponins slightly trended up but essentially flat. Chest x-ray this morning notes improvement in vascular congestion.   Objective: Vital Signs in the last 24 hours: Temp:  [97.9 F (36.6 C)-98.2 F (36.8 C)] 97.9 F (36.6 C) (10/12 0353) Pulse Rate:  [67-84] 81 (10/12 0353) Resp:  [18-24] 22 (10/12 0353) BP: (104-126)/(47-61) 111/61 (10/12 0427) SpO2:  [97 %-98 %] 97 % (10/12 0353) Weight:  [45.9 kg] 45.9 kg (10/12 0557)  Intake/Output:  Intake/Output Summary (Last 24 hours) at 03/09/2021 1607 Last data filed at 03/09/2021 1435 Gross per 24 hour  Intake 1280 ml  Output 1500 ml  Net -220 ml    Net IO Since Admission: 440.1 mL [03/09/21 1607]  Weights:  Filed Weights   03/07/21 0005 03/08/21 0009 03/09/21 0557  Weight: 45 kg 45.9 kg 45.9 kg    Telemetry: Personally reviewed.  Atrial fibrillation  Physical examination: PHYSICAL EXAM: Vitals with BMI 03/09/2021 03/09/2021 03/09/2021  Height - - -  Weight 101 lbs 3 oz - -  BMI 29.7 - -  Systolic - 989 211  Diastolic - 61 49  Pulse - - 81    CONSTITUTIONAL: Appears older than stated age, frail, hemodynamically stable, no acute distress  SKIN: Skin is warm and dry. No rash noted. No cyanosis. No pallor. No jaundice HEAD: Normocephalic and atraumatic.  EYES: No scleral icterus MOUTH/THROAT: Moist oral membranes.  NECK: Minimal JVP. No thyromegaly noted. No carotid bruits  LYMPHATIC: No visible cervical  adenopathy.  CHEST Normal respiratory effort. No intercostal retractions  LUNGS: Clear to auscultation bilaterally.  No stridor. No wheezes. No rales.  CARDIOVASCULAR: Irregularly irregular, variable H4-R7, holosystolic murmur heard at the apex, no gallops or rubs ABDOMINAL: Soft, nontender, nondistended, positive bowel sounds in all 4 quadrants, no apparent ascites.  EXTREMITIES: No peripheral edema, dry skin, warm to touch HEMATOLOGIC: No significant bruising NEUROLOGIC: Oriented to person, place, and time. Nonfocal. Normal muscle tone.  PSYCHIATRIC: Normal mood and affect. Normal behavior. Cooperative  Lab Results: Hematology Recent Labs  Lab 03/06/21 0406 03/07/21 1058 03/09/21 0715  WBC 15.5* 12.9* 10.2  RBC 3.77* 4.01 3.72*  HGB 10.9* 11.6* 10.7*  HCT 34.2* 35.7* 32.4*  MCV 90.7 89.0 87.1  MCH 28.9 28.9 28.8  MCHC 31.9 32.5 33.0  RDW 15.0 14.6 14.5  PLT 177 201 237    Chemistry Recent Labs  Lab 03/05/21 1246 03/06/21 0406 03/07/21 0334 03/08/21 0245 03/09/21 0306  NA  --  131* 130* 126* 124*  K  --  3.9 4.5 4.6 4.4  CL  --  98 97* 94* 92*  CO2  --  22 24 21* 22  GLUCOSE  --  119* 111* 108* 137*  BUN  --  27* 29* 31* 32*  CREATININE  --  1.06* 1.20* 1.28* 1.19*  CALCIUM  --  9.0 9.2 9.1 8.7*  PROT 6.5 6.4*  --   --   --   ALBUMIN 3.6 3.3*  --   --   --   AST  24 17  --   --   --   ALT 11 10  --   --   --   ALKPHOS 61 56  --   --   --   BILITOT 1.5* 1.8*  --   --   --   GFRNONAA  --  49* 42* 39* 43*  ANIONGAP  --  _0 Cardiac Enzymes: Cardiac Panel (last 3 results) Recent Labs    03/09/21 0306 03/09/21 0715  TROPONINIHS 107* 99*    BNP (last 3 results) Recent Labs    03/05/21 0502 03/08/21 0245 03/09/21 0306  BNP 1,232.5* 1,124.4* 1,573.4*    ProBNP (last 3 results) No results for input(s): PROBNP in the last 8760 hours.   DDimer No results for input(s): DDIMER in the last 168 hours.   Hemoglobin A1c: No results found for:  HGBA1C, MPG  TSH  Recent Labs    03/05/21 0502  TSH 2.162    Lipid Panel     Component Value Date/Time   CHOL 157 05/06/2020 1354   TRIG 136 05/06/2020 1354   HDL 65 05/06/2020 1354   CHOLHDL 3.4 01/22/2016 0447   VLDL 26 01/22/2016 0447   LDLCALC 69 05/06/2020 1354    Imaging: DG Chest 2 View  Result Date: 03/09/2021 CLINICAL DATA:  Chest pain. EXAM: CHEST - 2 VIEW COMPARISON:  03/07/2021 FINDINGS: The cardio pericardial silhouette is enlarged. Interstitial markings are diffusely coarsened with chronic features. Similar appearance of bibasilar collapse/consolidation and small bilateral pleural effusions. Bones are diffusely demineralized. Telemetry leads overlie the chest. IMPRESSION: Persistent bibasilar collapse/consolidation with small bilateral pleural effusions. Electronically Signed   By: Misty Stanley M.D.   On: 03/09/2021 09:45    Cardiac Studies: EKG: 03/05/2021: Normal sinus rhythm, 78 bpm, LAE, left axis, left bundle branch block, ST-T changes most likely secondary to LBBB. 03/09/2021: Atrial fibrillation, 82 bpm, left bundle branch block.   Echocardiogram: 10/06/2020: Normal LV systolic function with visual EF 55-60%. Left ventricle cavity is normal in size. Normal global wall motion. Basal septal hypertrophy. Diastolic dysfunction not accurate due to MAC but doppler would be consistent with grade III restrictive filling pressure and elevated LAP.   Left atrial cavity is severely dilated.  Mild to moderate aortic regurgitation. Degenerative and calcific mitral valve with subvalvular calcification. Mild to moderate prolapse, Eccentric jet wraps along the IAS consistent with severe (Grade III) mitral regurgitation. Moderate tricuspid valve regurgitation. Moderate pulmonary hypertension. RVSP measures 67 mmHg. Mild pulmonic regurgitation. IVC is dilated with a respiratory response of <50%. Compared to prior study dated 11/30/2018: LAE was moderately dilated now severe  otherwise no significant change.    03/06/2021:  1. Left ventricular ejection fraction, by estimation, is 50 to 55%. The  left ventricle has low normal function. The left ventricle has no regional wall motion abnormalities. There is moderate asymmetric left ventricular hypertrophy of the basal-septal segment. Left ventricular diastolic parameters are consistent with Grade  III diastolic dysfunction (restrictive).   2. Right ventricular systolic function is mildly reduced. The right  ventricular size is mildly enlarged. There is moderately elevated  pulmonary artery systolic pressure. The estimated right ventricular  systolic pressure is 27.0 mmHg.   3. Left atrial size was severely dilated.   4. Right atrial size was mildly dilated.   5. The mitral valve is degenerative. Severe mitral valve regurgitation. Moderate mitral annular calcification.   6. Tricuspid valve regurgitation is moderate.   7. The  aortic valve is tricuspid. Aortic valve regurgitation is trivial.  Mild to moderate aortic valve sclerosis/calcification is present, without any evidence of aortic stenosis.   8. The inferior vena cava is dilated in size with <50% respiratory  variability, suggesting right atrial pressure of 15 mmHg.    Stress Testing:  Myocardial perfusion imaging 01/23/2016: 1. There is a large fixed defect involving the mid and distal anterior wall, anterior septum and anterior lateral wall. No reversibility identified. 2. There is hypokinesis involving the anterior septum, anterior wall and anterior lateral wall which corresponds to the large fixed defect. 3. Left ventricular ejection fraction 60% 4. Non invasive risk stratification*: low   Heart Catheterization: 12/18/2014: RPDA lesion, 70% stenosed. PTCA and stenting of the proximal and mid circumflex with a 3.0 x 38 mm resolute integrity DES for 90% stenosis, stenting of the mid RCA with a 2.75 x 26 mm resolute integrity DES for 99% stenosis, both  reduced to 0%. LM lesion, 10% stenosed. LVEF 45% with mid to distal anterolateral akinesis, inferolateral mild hypokinesis.  Scheduled Meds:  aspirin EC  81 mg Oral QPC breakfast   atorvastatin  20 mg Oral Daily   cefdinir  300 mg Oral q1800   dapagliflozin propanediol  10 mg Oral Daily   dorzolamide  2 drop Both Eyes BID   furosemide  40 mg Intravenous BID   isosorbide dinitrate  30 mg Oral TID   latanoprost  2 drop Both Eyes QHS   levothyroxine  75 mcg Oral QAC breakfast   metoprolol succinate  12.5 mg Oral BID   potassium chloride  20 mEq Oral BID   ranolazine  500 mg Oral Daily    Continuous Infusions:  heparin 550 Units/hr (03/09/21 0731)    PRN Meds: acetaminophen, bismuth subsalicylate, nitroGLYCERIN   IMPRESSION & RECOMMENDATIONS: Candace Ross is a 87 y.o. female whose past medical history and cardiac risk factors include: Coronary artery disease status post PCI proximal/mid LCx and RCA July 2016, history of HFpEF, severe mitral regurgitation, hypertension, hyperlipidemia, asymptomatic carotid artery stenosis, postmenopausal female, advanced age.  Atrial fibrillation: Newly discovered. Rate controlled Rate control: Metoprolol Rhythm control: N/A Thromboembolic prophylaxis: Heparin CHA2DS2-VASc SCORE is 6 which correlates to 9.8% risk of stroke per year (HFpEF, hypertension, age greater than 37, aortic atherosclerosis, female gender) Discussed the risks, benefits, and alternatives to oral anticoagulation.  Patient understands and given high CHA2DS2-VASc score she is at risk of thromboembolic event.  Fall precautions discussed.  She would like to continue with anticoagulation upon discharge.    Established coronary artery disease with prior angioplasty/stenting with stable angina pectoris Prior to the hospitalization she is noted to have angina pectoris which responds very well to sublingual nitroglycerin tablets. However, despite being on antianginal therapy which  includes Toprol-XL, Ranexa, isosorbide dinitrate she continues to have episodes of angina. Earlier this morning when she had a chest pain she was noted to be in atrial fibrillation and high sensitive troponins were checked.  The initial troponin was elevated at 107 followed by 99.  This is essentially flat and not suggestive of ACS.  This is most likely secondary to supply demand ischemia in the setting of newly discovered atrial fibrillation, underlying valve disease and CAD. However, I did offer the patient to undergo right and left heart catheterization to evaluate her hemodynamics and CAD.  Also reviewed the risks, benefits, and alternatives.  Patient requested some time to think this over prior to making an informed decision.  She had called  the office after afternoon rounds and voiced that she would like to continue with medical management for now. Continue current antianginal therapy.  We will further uptitrate beta-blockers as hemodynamics and laboratory values allow.  Acute on chronic exacerbation of HFpEF, stage C, NYHA class II: Improving. Most likely secondary to valvular heart disease and diastolic dysfunction. Less dyspneic requiring less oxygen support. Chest x-ray from the morning notes no significant vascular congestion. Has responded well to IV Lasix Patient's GFR is greater than 30 mL/min, will start Farxiga 10 mg p.o. daily with close monitoring of electrolytes. Medications reconciled. Patient was on losartan at home.  Would focus on diuresis for now and prior to discharge consider restarting losartan or transitioning to low-dose Entresto based on hemodynamics and laboratory values. Echocardiogram personally reviewed: preserved LVEF, grade 3 diastolic impairment, elevated LAP    Mitral regurgitation: Severe per transthoracic echo. In the past patient has discussed possible intervention with her primary cardiologist Dr. Einar Gip.  However, given her age and comorbid conditions  patient has chose to treated medically.  She still feels the same. Monitor for now.  Left bundle branch block: Chronic and stable   Asymptomatic carotid artery stenosis: Bilateral carotid bruits. Asymptomatic. Continue aspirin and statin therapy. Monitor for now   Secondary diagnoses: Hyponatremia -will defer to primary team. Hypertension - monitor blood pressures. Hyperlipidemia-continue statin therapy.  She does not endorse myalgias.  Palliative care has been consulted for helping the patient with goals of care which is very appropriate given her age, heart failure, valvular heart disease, CAD, and other chronic comorbid conditions.  From a cardiovascular standpoint would focus on diuresis and up titration of GDMT and antianginal therapy as hemodynamics and laboratory values allow.  She has an outpatient follow-up appointment scheduled tentatively for 03/18/2021.  Plan of care discussed with family medicine resident Dr. Pollie Friar.  We will follow the patient with you.  Patient's questions and concerns were addressed to her satisfaction. She voices understanding of the instructions provided during this encounter.   This note was created using a voice recognition software as a result there may be grammatical errors inadvertently enclosed that do not reflect the nature of this encounter. Every attempt is made to correct such errors.  Total time spent: 45 minutes  Omari Koslosky [place], DO, [facility]. [place] Office: [phone] 03/09/2021, 4:07 PM

## 2021-03-09 NOTE — TOC Progression Note (Signed)
Transition of Care [facility]) - Progression Note    Patient Details  Name: Candace Ross MRN: [id number] Date of Birth: November 17, 1926  [facility] CM/SW Contact  Reece Agar, [place] Phone Number: 03/09/2021, 4:48 PM  Clinical Narrative:    CSW spoke with pt and family in room, pt had concerns about facilities and insurance coverage. Kelly at [facility] inquired about pt but now has no bed availability.  CSW will follow up on facilities and family tomorrow.  Expected Discharge Plan: [place] Barriers to Discharge: Continued Medical Work up, Ship broker  Expected Discharge Plan and Services Expected Discharge Plan: [place] arrangements for the past 2 months: Single Family Home                                       Social Determinants of Health (SDOH) Interventions    Readmission Risk Interventions No flowsheet data found.

## 2021-03-09 NOTE — Progress Notes (Signed)
  Mobility Specialist Criteria Algorithm Info.  SATURATION QUALIFICATIONS: (This note is used to comply with regulatory documentation for home oxygen)  Patient Saturations on Room Air at Rest = 94%  Patient Saturations on Room Air while Ambulating = 85%  Patient Saturations on 0 Liters of oxygen while Ambulating = n/a%  Please briefly explain why patient needs home oxygen:  Mobility Team: [facility] elevated:Self regulated Activity: Ambulated in hall; Transferred:  Bed to chair (to chair after ambulation) Range of motion: Active; All extremities Level of assistance: Contact guard assist, steadying assist Assistive device: Front wheel walker Minutes sitting in chair:  Minutes stood: 15 minutes Minutes ambulated: 8 minutes Distance ambulated (ft): 70 ft Mobility response: Tolerated well Bed Position: Chair  Patient received lying supine bed requesting assistance to sink, declining hallway ambulation initially. Pt stood and performed oral care and face washing with supervision. Agreed to ambulate in hallway upon finishing general hygiene care. Ambulated in hallway at min guard with slow steady gait. Oxygen desaturated into low 80's but patient adamantly refused supplemental oxygen stating "the doctor told me I don't need it anymore" even with education on the need for O2 pt adamantly declined. Was asymptomatic throughout. Sat in recliner chair upon returning to room. Oxygen saturating in the mid 90's with seated rest. Tolerated ambulation without complaint or incident and was left in recliner with all needs met.    03/09/2021 3:57 PM

## 2021-03-09 NOTE — Progress Notes (Signed)
[place] for Heparin  Indication: atrial fibrillation, elevated troponin  Allergies  Allergen Reactions   Penicillins Other (See Comments)    NOT Allergy to penicillin  Allergic to FILLER ONLY  Non active ingredient   Other Other (See Comments)    "some medicine they gave me in the hospital, it was a little purple sleeping  pill, to help me sleep didn't work; it made me mean"    Patient Measurements: Height: 4\' 8"  (142.2 cm) Weight: 45.9 kg (101 lb 3.2 oz) IBW/kg (Calculated) : 36.3 Heparin dosing weight = 46 kg  Vital Signs:    Labs: Recent Labs    03/07/21 0334 03/07/21 1058 03/08/21 0245 03/09/21 0306 03/09/21 0715 03/09/21 1523  HGB  --  11.6*  --   --  10.7*  --   HCT  --  35.7*  --   --  32.4*  --   PLT  --  201  --   --  237  --   HEPARINUNFRC  --   --   --   --   --  <0.10*  CREATININE 1.20*  --  1.28* 1.19*  --   --   TROPONINIHS  --   --   --  107* 99*  --      Estimated Creatinine Clearance: 19.1 mL/min (A) (by C-G formula based on SCr of 1.19 mg/dL (H)).  Assessment: 87 y/o F with chest pressure/neck pain.  Found to be have new onset Afib and Pharmacy consulted to dose IV heparin.  Heparin level is undetectable.  RN reports heparin was leaking this AM and she had to replace the IV.  Heparin was resumed around 1000, so this heparin level is a 5.5-hr level instead of an 8-hr level.  No bleeding reported.  Goal of Therapy:  Heparin level 0.3-0.7 units/ml Monitor platelets by anticoagulation protocol: Yes   Plan:  Heparin 1000 units IV bolus, Increase heparin drip slightly to 650 units/hr Check 8 hr heparin level  Aristide Waggle D. Mina Marble, PharmD, BCPS, [place] 03/09/2021, 4:49 PM

## 2021-03-09 NOTE — Telephone Encounter (Signed)
This pt called in today and wanted to let you know that she has given a lot of thought about doing medication or a procedure. She says that the risk are far to great for her to do a procedure and wants to stick with just medication at this time. She said if you need to contact her she is currently in the hospital and the hosp. phone is the best way.

## 2021-03-09 NOTE — Progress Notes (Addendum)
Physical Therapy Treatment Patient Details Name: Candace Ross MRN: [id number] DOB: 1927/01/30 Today's Date: 03/09/2021   History of Present Illness Pt is a 87 y.o. who presents 03/05/21 with acute on chronic exacerbation of HFpEF. Pt with new afib 10/11. Significant PMH: CAD s/p PCI, HFpEF, severe mitral regurgitation, HTN, left bundle branch block, asymptomatic CAD.    PT Comments    MD cleared pt for session. Pt agreeable to getting OOB to ambulate. Pt needing increased time and x2 standing rest breaks to ambulate up to ~60 ft with a RW and min guard assist this date. Due to pt's continued deficits in endurance, balance, and strength she could benefit from a short-term rehab stay at a SNF. Will continue to follow acutely.    Recommendations for follow up therapy are one component of a multi-disciplinary discharge planning process, led by the attending physician.  Recommendations may be updated based on patient status, additional functional criteria and insurance authorization.  Follow Up Recommendations  SNF     Equipment Recommendations  3in1 (PT)    Recommendations for Other Services       Precautions / Restrictions Precautions Precautions: Fall Precaution Comments: watch SPO2, drops on RA with mobility, okay at rest Restrictions Weight Bearing Restrictions: No     Mobility  Bed Mobility Overal bed mobility: Needs Assistance Bed Mobility: Supine to Sit;Sit to Supine     Supine to sit: Min guard;HOB elevated Sit to supine: Min assist   General bed mobility comments: Extra time and HOB elevated to come to sit EOB. MinA to manage legs back onto bed with return to supine.    Transfers Overall transfer level: Needs assistance Equipment used: Rolling walker (2 wheeled) Transfers: Sit to/from Stand Sit to Stand: Min guard         General transfer comment: Extra time and min guard assist for safety with transfer to stand.  Ambulation/Gait Ambulation/Gait  assistance: Min guard Gait Distance (Feet): 60 Feet (x2 bouts of ~60 ft > ~20 ft) Assistive device: Rolling walker (2 wheeled) Gait Pattern/deviations: Step-through pattern;Decreased stride length;Trunk flexed Gait velocity: dec Gait velocity interpretation: <1.31 ft/sec, indicative of household ambulator General Gait Details: Pt needing x2 standing rest breaks during ~60 ft gait bout. SpO2 reading as low as 71% but poor pleth waveforms, once waveforms improved it quickly displayed 89% or more. Pt with slow pace and trunk flexed. No LOB but extra time managing RW when turning.   Stairs             Wheelchair Mobility    Modified Rankin (Stroke Patients Only)       Balance Overall balance assessment: Needs assistance Sitting-balance support: Feet supported;No upper extremity supported Sitting balance-Leahy Scale: Good     Standing balance support: During functional activity;No upper extremity supported;Bilateral upper extremity supported Standing balance-Leahy Scale: Fair Standing balance comment: Pt able to perform tasks at sink with no UE support but is reliant on bil UE support on RW when mobilizing.                            Cognition Arousal/Alertness: Awake/alert Behavior During Therapy: WFL for tasks assessed/performed Overall Cognitive Status: Within Functional Limits for tasks assessed                                 General Comments: Pt needing redirecting intermittently.  Exercises      General Comments General comments (skin integrity, edema, etc.): SpO2 with variable, unreliable readings with poor waveforms reading as low as 71% on RA. When waveforms were consistent SpO2 read >/= 89% throughout on RA      Pertinent Vitals/Pain Pain Assessment: Faces Faces Pain Scale: Hurts a little bit Pain Location: legs Pain Descriptors / Indicators: Discomfort;Grimacing;Guarding Pain Intervention(s): Limited activity within patient's  tolerance;Monitored during session;Repositioned    Home Living                      Prior Function            PT Goals (current goals can now be found in the care plan section) Acute Rehab PT Goals Patient Stated Goal: to improve PT Goal Formulation: With patient Time For Goal Achievement: 03/20/21 Potential to Achieve Goals: Fair Progress towards PT goals: Progressing toward goals    Frequency    Min 3X/week      PT Plan Current plan remains appropriate    Co-evaluation              AM-PAC PT "6 Clicks" Mobility   Outcome Measure  Help needed turning from your back to your side while in a flat bed without using bedrails?: None Help needed moving from lying on your back to sitting on the side of a flat bed without using bedrails?: A Little Help needed moving to and from a bed to a chair (including a wheelchair)?: A Little Help needed standing up from a chair using your arms (e.g., wheelchair or bedside chair)?: A Little Help needed to walk in hospital room?: A Little Help needed climbing 3-5 steps with a railing? : A Lot 6 Click Score: 18    End of Session Equipment Utilized During Treatment: Gait belt Activity Tolerance: Patient tolerated treatment well Patient left: with call bell/phone within reach;in bed;with bed alarm set Nurse Communication: Mobility status PT Visit Diagnosis: Unsteadiness on feet (R26.81);Muscle weakness (generalized) (M62.81);Difficulty in walking, not elsewhere classified (R26.2);Other abnormalities of gait and mobility (R26.89)     Time: 1610-9604 PT Time Calculation (min) (ACUTE ONLY): 23 min  Charges:  $Gait Training: 8-22 mins $Therapeutic Activity: 8-22 mins                     Moishe Spice, PT, DPT Acute Rehabilitation Services  Pager: [phone] Office: [place] 03/09/2021, 3:50 PM

## 2021-03-10 ENCOUNTER — Other Ambulatory Visit (HOSPITAL_COMMUNITY): Payer: Self-pay

## 2021-03-10 ENCOUNTER — Inpatient Hospital Stay (HOSPITAL_COMMUNITY): Payer: Medicare HMO

## 2021-03-10 DIAGNOSIS — N179 Acute kidney failure, unspecified: Secondary | ICD-10-CM

## 2021-03-10 DIAGNOSIS — Z66 Do not resuscitate: Secondary | ICD-10-CM

## 2021-03-10 DIAGNOSIS — I5033 Acute on chronic diastolic (congestive) heart failure: Secondary | ICD-10-CM | POA: Diagnosis not present

## 2021-03-10 DIAGNOSIS — R531 Weakness: Secondary | ICD-10-CM

## 2021-03-10 DIAGNOSIS — H9190 Unspecified hearing loss, unspecified ear: Secondary | ICD-10-CM

## 2021-03-10 DIAGNOSIS — I208 Other forms of angina pectoris: Secondary | ICD-10-CM

## 2021-03-10 LAB — BASIC METABOLIC PANEL
Anion gap: 11 (ref 5–15)
BUN: 34 mg/dL — ABNORMAL HIGH (ref 8–23)
CO2: 22 mmol/L (ref 22–32)
Calcium: 8.7 mg/dL — ABNORMAL LOW (ref 8.9–10.3)
Chloride: 92 mmol/L — ABNORMAL LOW (ref 98–111)
Creatinine, Ser: 1.36 mg/dL — ABNORMAL HIGH (ref 0.44–1.00)
GFR, Estimated: 37 mL/min — ABNORMAL LOW (ref >60)
Glucose, Bld: 135 mg/dL — ABNORMAL HIGH (ref 70–99)
Potassium: 3.8 mmol/L (ref 3.5–5.1)
Sodium: 125 mmol/L — ABNORMAL LOW (ref 135–145)

## 2021-03-10 LAB — CBC
HCT: 32.6 % — ABNORMAL LOW (ref 36.0–46.0)
Hemoglobin: 10.8 g/dL — ABNORMAL LOW (ref 12.0–15.0)
MCH: 29 pg (ref 26.0–34.0)
MCHC: 33.1 g/dL (ref 30.0–36.0)
MCV: 87.4 fL (ref 80.0–100.0)
Platelets: 267 10*3/uL (ref 150–400)
RBC: 3.73 MIL/uL — ABNORMAL LOW (ref 3.87–5.11)
RDW: 14.6 % (ref 11.5–15.5)
WBC: 9.5 10*3/uL (ref 4.0–10.5)
nRBC: 0 % (ref 0.0–0.2)

## 2021-03-10 LAB — BRAIN NATRIURETIC PEPTIDE: B Natriuretic Peptide: 2289.7 pg/mL — ABNORMAL HIGH (ref 0.0–100.0)

## 2021-03-10 LAB — HEPARIN LEVEL (UNFRACTIONATED): Heparin Unfractionated: <0.1 IU/mL — ABNORMAL LOW (ref 0.30–0.70)

## 2021-03-10 LAB — TROPONIN I (HIGH SENSITIVITY)
Troponin I (High Sensitivity): 45 ng/L — ABNORMAL HIGH (ref <18)
Troponin I (High Sensitivity): 49 ng/L — ABNORMAL HIGH (ref <18)

## 2021-03-10 MED ORDER — METOPROLOL TARTRATE 25 MG PO TABS
25.0000 mg | ORAL_TABLET | Freq: Two times a day (BID) | ORAL | Status: DC
  Administered 2021-03-10 – 2021-03-11 (×3): 25 mg via ORAL
  Filled 2021-03-10 (×3): qty 1

## 2021-03-10 MED ORDER — FUROSEMIDE 40 MG PO TABS
40.0000 mg | ORAL_TABLET | Freq: Two times a day (BID) | ORAL | Status: DC
  Administered 2021-03-11 (×2): 40 mg via ORAL
  Filled 2021-03-10 (×2): qty 1

## 2021-03-10 MED ORDER — FUROSEMIDE 10 MG/ML IJ SOLN
40.0000 mg | Freq: Once | INTRAMUSCULAR | Status: AC
  Administered 2021-03-10: 40 mg via INTRAVENOUS

## 2021-03-10 MED ORDER — FUROSEMIDE 10 MG/ML IJ SOLN
40.0000 mg | Freq: Once | INTRAMUSCULAR | Status: AC
  Administered 2021-03-10: 40 mg via INTRAVENOUS
  Filled 2021-03-10: qty 4

## 2021-03-10 MED ORDER — HEPARIN BOLUS VIA INFUSION
1000.0000 [IU] | Freq: Once | INTRAVENOUS | Status: AC
  Administered 2021-03-10: 1000 [IU] via INTRAVENOUS
  Filled 2021-03-10: qty 1000

## 2021-03-10 MED ORDER — FUROSEMIDE 10 MG/ML IJ SOLN
INTRAMUSCULAR | Status: AC
  Filled 2021-03-10: qty 4

## 2021-03-10 MED ORDER — APIXABAN 2.5 MG PO TABS
2.5000 mg | ORAL_TABLET | Freq: Two times a day (BID) | ORAL | Status: DC
  Administered 2021-03-10 – 2021-03-11 (×3): 2.5 mg via ORAL
  Filled 2021-03-10 (×3): qty 1

## 2021-03-10 NOTE — Progress Notes (Signed)
Family Medicine Teaching Service Daily Progress Note Intern Pager: [phone]  Patient name: Candace Ross Medical record number: [id number] Date of birth: 1927/04/22 Age: 87 y.o. Gender: female  Primary Care Provider: Buzzy Han, MD Consultants: cardiology Code Status: FULL   Pt Overview and Major Events to Date:  10/8: admitted   Assessment and Plan: Candace Ross is a 87 y.o. female presenting with chest pain and dyspnea. PMH is significant for CAD s/p PCI in 2016, HFpEF, mitral regurgitation, LBBB,  HTN ,spinal stenosis w/ claudication, CKD stage III, Hypothyroidism, scoliosis and breast cancer.   Acute hypoxic respiratory failure 2/2 HFpEF exacerbation vs CAP Diuresis with 40 mg IV Lasix BID the past several days. Planning for transition to oral diuretics today, potentially 40 mg BID (home regimen of 20 mg BID). Patient declined L/RHC from Dr. Terri Skains. Started on Farxiga for Candace Ross. Satting well on RA yesterday, desat to 85 when ambulating. This morning, patient is found on 2L [place], does not desat when taken off. Lung auscultation shows faint crackles (improved). Has difficulty with moving around in bed and difficulty standing. SBP 102-131, MAPs 71-83. UOP of 1.2L. Cr up to 1.36 (baseline 1-1.13). Na stable at 125.  -Continue Cefdinir for 5 day course (10/10-10/14).  -Transition to Lasix 40 mg daily pending cards recs -Farxiga per cards -K>4, Mg>2: Kcl 20 meq BID -Strict Is/Os -Daily weights -Routine vitals -PT/OT -Palliative consult today  Atrial Fibrillation Patient went into afib on 10/11 PM. Has been in persistent afib with rate <100. CHADSVASC of 4. Patient desires anticoagulation with Eliquis. -Transition from Heparin drip to Eliquis 2.5 mg BID -Increase Toprol to 25 mg BID    Chest pain, CAD s/p PCI in 2016 Minimal chest pain currently, at baseline.  -Continue home ASA 81 mg - Isordil 30 mg TID (changed from Imdur) -Continue home medication of Atorvastatin  20 mg -Continue sublingual nitroglycerine -Added Ranexa 500 mg   CKD 3a Bl cr of 1-1.13. Up to 1.32 on admission. Currently 1.36.   -Daily BMP   Hyponatremia Na of 125 this AM 2/2 HF exacerbation. Patient is currently asymptomatic.  -CTM with daily BMP   Hx of Breast Cancer Was diagnosed with left breast invasive ductal Carcinoma in 2013 treated with needle localized lumpectomy.    FEN/GI: heart healthy PPx: Lovenox Dispo: SNF once medically stable  Subjective:  On interview this morning, patient reports minimal chest pain. Does report some SoB.   Objective: Temp:  [97.8 F (36.6 C)] 97.8 F (36.6 C) (10/13 0257) Pulse Rate:  [75-81] 75 (10/13 0257) Resp:  [20-23] 23 (10/13 0257) BP: (102-131)/(59-69) 117/69 (10/13 0257) SpO2:  [92 %-100 %] 100 % (10/13 0257) Weight:  [100 lb 8 oz (45.6 kg)] 100 lb 8 oz (45.6 kg) (10/13 0014) Physical Exam Vitals reviewed.  Cardiovascular:     Rate and Rhythm: Normal rate. Rhythm irregular.     Heart sounds: Murmur heard.  Systolic murmur is present.  Pulmonary:     Effort: Pulmonary effort is normal.     Comments: Faint crackles, much improved Abdominal:     General: Bowel sounds are normal.     Palpations: Abdomen is soft.  Neurological:     Mental Status: She is alert.   Laboratory: Recent Labs  Lab 03/07/21 1058 03/09/21 0715 03/10/21 0232  WBC 12.9* 10.2 9.5  HGB 11.6* 10.7* 10.8*  HCT 35.7* 32.4* 32.6*  PLT 201 237 267   Recent Labs  Lab 03/05/21 1246 03/06/21 0406 03/07/21 0334  03/08/21 0245 03/09/21 0306 03/10/21 0232  NA  --  131*   < > 126* 124* 125*  K  --  3.9   < > 4.6 4.4 3.8  CL  --  98   < > 94* 92* 92*  CO2  --  22   < > 21* 22 22  BUN  --  27*   < > 31* 32* 34*  CREATININE  --  1.06*   < > 1.28* 1.19* 1.36*  CALCIUM  --  9.0   < > 9.1 8.7* 8.7*  PROT 6.5 6.4*  --   --   --   --   BILITOT 1.5* 1.8*  --   --   --   --   ALKPHOS 61 56  --   --   --   --   ALT 11 10  --   --   --   --   AST 24  17  --   --   --   --   GLUCOSE  --  119*   < > 108* 137* 135*   < > = values in this interval not displayed.    Imaging/Diagnostic Tests: None new   Corky Sox PGY-1 [place] Intern pager: [phone], text pages welcome

## 2021-03-10 NOTE — Consult Note (Signed)
Consultation Note Date: 03/10/2021   Patient Name: Candace Ross  DOB: [birth year]  MRN: [id number]  Age / Sex: 87 y.o., female  PCP: Buzzy Han, MD Referring Physician: Lind Covert, MD  Reason for Consultation: Establishing goals of care  HPI/Patient Profile: 87 y.o. female   admitted on 03/05/2021 with a chief complaint of " chest pain and shortness of breath." Her past medical history and cardiovascular risk factors include: Coronary artery disease status post PCI proximal/mid LCx and RCA July 2016, history of HFpEF, severe mitral regurgitation, hypertension, hyperlipidemia, asymptomatic carotid artery stenosis, postmenopausal female, advanced age.   Patient presents to the hospital with a chief complaint of chest pain and shortness of breath.   Work-up in the ED notes sinus rhythm on EKG without underlying injury pattern, troponins essentially flat not suggestive of ACS, elevated BNP levels.   She also has elevated WBC count.   Patient lives alone, she is frail and with increasing care needs.   She has limited social support  Patient faces treatment option decisions, advanced directive decisions and anticipatory care needs.   Clinical Assessment and Goals of Care:  This NP Wadie Lessen reviewed medical records, received report from team, assessed the patient and then meet at the patient's bedside  to discuss diagnosis, prognosis, GOC, EOL wishes disposition and options.     Concept of Palliative Care was introduced as specialized medical care for people and their families living with serious illness.  If focuses on providing relief from the symptoms and stress of a serious illness.  The goal is to improve quality of life for both the patient and the family. Values and goals of care important to patient and family were attempted to be elicited.  Created space and opportunity for  patient  and family to explore thoughts and feelings regarding current medical situation.  Patient is well aware of her advanced age and her station in her life trajectory.  She hopes to remain in her own home and with independence as long as possible.  She is considering the possibility of going to live with her sister-in-law who I believe lives in the [place] area.   Patient agrees with SNF for short term rehab.   This is a very difficult time for her, facing her own mortality and losing her independence.   A  discussion was had today regarding advanced directives.  Concepts specific to code status, artifical feeding and hydration, continued IV antibiotics and rehospitalization was had.  The difference between a aggressive medical intervention path  and a palliative comfort care path for this patient at this time was had.     MOST form introduced, patient declined completion at this time.     Questions and concerns addressed.  Patient  encouraged to call with questions or concerns.     PMT will continue to support holistically.        I then spoke to her brother/Steve Owens Shark by phone with patient's permission for review of my discussion with Ms. Zachery Dakins  PATIENT has medical capacity to make her own decisions at this point in time.  She tells me that she has documents but none are in EMR.  She tells me that her brother Richardson Landry is her main support person and would be her spokesperson in the event that she cannot speak for herself.    SUMMARY OF RECOMMENDATIONS    Code Status/Advance Care Planning: DNR-documented today    Symptom Management:  Per attending  Palliative Prophylaxis:  Bowel Regimen, Delirium Protocol, Frequent Pain Assessment, and Oral Care   Psycho-social/Spiritual:  Desire for further Chaplaincy support:no-declined  Prognosis:  Unable to determine  Discharge Planning: [place] for rehab with Palliative care service follow-up      Primary  Diagnoses: Present on Admission:  Dyspnea  Hyponatremia  Severe mitral regurgitation  Leukocytosis  Hypertensive heart and kidney disease with HF and with CKD stage III (HCC)  Chest pain  Acute on chronic diastolic CHF (congestive heart failure) ([place])  Macular degeneration   I have reviewed the medical record, interviewed the patient and family, and examined the patient. The following aspects are pertinent.  Past Medical History:  Diagnosis Date   Anemia    Atrial fibrillation ([place]) 03/09/2021   Back pain    Breast cancer ([place])    Change in stool habits    Dribbling urine    H/O bone density study 2009   Hearing loss    Hypertension    Osteoporosis    Sinus problem    SOB (shortness of breath) on exertion    stairs   Spinal stenosis    Thyroid disease    Wears dentures    Social History   Socioeconomic History   Marital status: Widowed    Spouse name: Not on file   Number of children: 1   Years of education: Not on file   Highest education level: Not on file  Occupational History   Not on file  Tobacco Use   Smoking status: Never   Smokeless tobacco: Never  Vaping Use   Vaping Use: Never used  Substance and Sexual Activity   Alcohol use: No   Drug use: No   Sexual activity: Not on file  Other Topics Concern   Not on file  Social History Narrative   Son died at age 3.   Social Determinants of Health   Financial Resource Strain: Not on file  Food Insecurity: Not on file  Transportation Needs: Not on file  Physical Activity: Not on file  Stress: Not on file  Social Connections: Not on file   Family History  Problem Relation Age of Onset   Pneumonia Father    Colon cancer Brother    Scheduled Meds:  aspirin EC  81 mg Oral QPC breakfast   atorvastatin  20 mg Oral Daily   cefdinir  300 mg Oral q1800   dapagliflozin propanediol  10 mg Oral Daily   dorzolamide  2 drop Both Eyes BID   isosorbide dinitrate  30 mg Oral TID   latanoprost  2 drop Both  Eyes QHS   levothyroxine  75 mcg Oral QAC breakfast   metoprolol succinate  12.5 mg Oral BID   potassium chloride  20 mEq Oral BID   ranolazine  500 mg Oral Daily   Continuous Infusions:  heparin 800 Units/hr (03/10/21 0356)   PRN Meds:.acetaminophen, bismuth subsalicylate, nitroGLYCERIN Medications Prior to Admission:  Prior to Admission medications   Medication Sig Start Date End Date Taking? Authorizing  Provider  acetaminophen (TYLENOL) 500 MG tablet Take 500 mg by mouth every 6 (six) hours as needed for headache (pain).    Yes [provider]  aspirin 81 MG EC tablet Take 81 mg by mouth daily after breakfast.    Yes [provider]  atorvastatin (LIPITOR) 20 MG tablet TAKE 1 TABLET EVERY DAY Patient taking differently: Take 20 mg by mouth at bedtime. 05/13/20  Yes Adrian Prows, MD  Cholecalciferol (VITAMIN D3 PO) Take 1 tablet by mouth daily.   Yes [provider]  Cyanocobalamin (VITAMIN B-12 PO) Take 1 tablet by mouth once a week.    Yes [provider]  dorzolamide (TRUSOPT) 2 % ophthalmic solution Place 2 drops into both eyes 2 (two) times daily.    Yes [provider]  Ferrous Sulfate (IRON PO) Take 1 tablet by mouth once a week.    Yes [provider]  furosemide (LASIX) 20 MG tablet Take 1 tablet (20 mg total) by mouth 2 (two) times daily. Take additional tab once daily for shortness of breath 02/11/21  Yes Adrian Prows, MD  isosorbide mononitrate (IMDUR) 60 MG 24 hr tablet Take 1 tablet (60 mg total) by mouth daily. 11/16/20  Yes Adrian Prows, MD  latanoprost (XALATAN) 0.005 % ophthalmic solution Place 2 drops into both eyes at bedtime.    Yes [provider]  levothyroxine (SYNTHROID) 75 MCG tablet Take 75 mcg by mouth daily before breakfast.   Yes [provider]  losartan (COZAAR) 50 MG tablet Take 1 tablet (50 mg total) by mouth at bedtime. 12/12/18  Yes Adrian Prows, MD  MAGNESIUM PO Take 1 tablet by mouth every  hour as needed (supplement).   Yes [provider]  metoprolol succinate (TOPROL-XL) 25 MG 24 hr tablet Take 1 tablet (25 mg total) by mouth daily. Patient taking differently: Take 25 mg by mouth 2 (two) times daily as needed (low blood pressure). 12/01/18  Yes Adrian Prows, MD  nitroGLYCERIN (NITROSTAT) 0.4 MG SL tablet Place 1 tablet (0.4 mg total) under the tongue every 5 (five) minutes as needed for chest pain. 10/27/20  Yes Adrian Prows, MD  potassium chloride (KLOR-CON) 8 MEQ tablet TAKE 1 TABLET TWICE DAILY WITH FUROSEMIDE Patient taking differently: Take 8 mEq by mouth 2 (two) times daily. 01/21/21  Yes Adrian Prows, MD  VITAMIN E PO Take 1 capsule by mouth every other day.    Yes [provider]   Allergies  Allergen Reactions   Penicillins Other (See Comments)    NOT Allergy to penicillin  Allergic to FILLER ONLY  Non active ingredient   Other Other (See Comments)    "some medicine they gave me in the hospital, it was a little purple sleeping  pill, to help me sleep didn't work; it made me mean"   Review of Systems  Respiratory:  Positive for chest tightness.   Neurological:  Positive for weakness.   Physical Exam Constitutional:      Appearance: She is underweight.  Cardiovascular:     Rate and Rhythm: Normal rate.  Skin:    General: Skin is warm and dry.  Neurological:     Mental Status: She is alert and oriented to person, place, and time.    Vital Signs: BP 117/69 (BP Location: Right Arm)   Pulse 75   Temp 97.8 F (36.6 C) (Oral)   Resp (!) 23   Ht 4\' 8"  (1.422 m)   Wt 45.6 kg   SpO2  100%   BMI 22.53 kg/m  Pain Scale: 0-10   Pain Score: 0-No pain (pt asleep)   SpO2: SpO2: 100 % O2 Device:SpO2: 100 % O2 Flow Rate: .O2 Flow Rate (L/min): 2 L/min  IO: Intake/output summary:  Intake/Output Summary (Last 24 hours) at 03/10/2021 0846 Last data filed at 03/10/2021 0300 Gross per 24 hour  Intake 820.57 ml  Output 1200 ml  Net -379.43 ml    LBM:  Last BM Date: 03/09/21 Baseline Weight: Weight: 44.5 kg Most recent weight: Weight: 45.6 kg     Palliative Assessment/Data:  50 %    Discussed with Dr Elissa Lovett [facility] team,   Time In:  0800 Time Out: 0915 Time Total: 75 minutes Greater than 50%  of this time was spent counseling and coordinating care related to the above assessment and plan.  Signed by: Wadie Lessen, NP   Please contact Palliative Medicine Team phone at [phone] for questions and concerns.  For individual provider: See Shea Evans

## 2021-03-10 NOTE — Care Management Important Message (Signed)
Important Message  Patient Details  Name: Candace Ross MRN: [id number] Date of Birth: [birth year]   Medicare Important Message Given:  Yes     Shelda Altes 03/10/2021, 9:10 AM

## 2021-03-10 NOTE — Progress Notes (Signed)
FPTS Brief Progress Note  S: Patient sleeping soundly.  Nurse called earlier in the evening to state that patient had low diastolic blood pressures.  Isordil was held.   O: BP (!) 102/59 (BP Location: Right Arm)   Pulse 81   Temp 97.8 F (36.6 C) (Oral)   Resp 20   Ht 4\' 8"  (1.422 m)   Wt 45.9 kg   SpO2 98%   BMI 22.69 kg/m   PE General: 87 year old female sleeping soundly, no acute distress Cardio: Patient still in A. fib, heart rate in the 70s Respiratory: Good chest rise and fall, normal work of breathing on 2 L nasal cannula with oxygen saturations in the high 90s.  A/P: -Continue plan outlined in day teams progress note - Orders reviewed. Labs for AM ordered, which was adjusted as needed.    Precious Gilding, DO 03/10/2021, 12:10 AM PGY-1, [facility] Night Resident  Please page [phone] with questions.

## 2021-03-10 NOTE — TOC Benefit Eligibility Note (Signed)
Patient Teacher, English as a foreign language completed.    The patient is currently admitted and upon discharge could be taking Eliquis 2.5 mg.  The current 90 day co-pay is, $9.85.   The patient is currently admitted and upon discharge could be taking Farxiga 10 mg.  The current 90 day co-pay is, $9.85.   The patient is currently admitted and upon discharge could be taking ranolazine (Ranexa) 500 mg   The current 90 day co-pay is, $3.95.   The patient is insured through [place] [place] Patient Advocate Specialist [place] Team Direct Number: [phone]  Fax: [phone]

## 2021-03-10 NOTE — TOC Progression Note (Addendum)
Transition of Care [facility]) - Progression Note    Patient Details  Name: KIM OKI MRN: [id number] Date of Birth: [birth year]  Transition of [facility]) CM/SW Contact  Reece Agar, [place] Phone Number: 03/10/2021, 1:23 PM  Clinical Narrative:    CSW spoke to pt about SNF choices pt was unsure about [facility] ultimately   decided to go with [facility] or return home with home health. Pt is not medically ready for DC. Pt has auth for [facility] starting 03/10/2021.  Pt and brother had concerns about having to pay a co-pay of 41 dollars. CSW assured pt and brother that there is no co-pay and contacted [facility] who verified pt approved insurance auth to be sure that there was no co pay. CSW will continue to follow for DC planning needs.  Expected Discharge Plan: [place] Barriers to Discharge: Continued Medical Work up, Ship broker  Expected Discharge Plan and Services Expected Discharge Plan: [place] arrangements for the past 2 months: Single Family Home                                       Social Determinants of Health (SDOH) Interventions    Readmission Risk Interventions No flowsheet data found.

## 2021-03-10 NOTE — Plan of Care (Signed)

## 2021-03-10 NOTE — Progress Notes (Signed)
[place] for Heparin  Indication: atrial fibrillation, elevated troponin  Allergies  Allergen Reactions   Penicillins Other (See Comments)    NOT Allergy to penicillin  Allergic to FILLER ONLY  Non active ingredient   Other Other (See Comments)    "some medicine they gave me in the hospital, it was a little purple sleeping  pill, to help me sleep didn't work; it made me mean"    Patient Measurements: Height: 4\' 8"  (142.2 cm) Weight: 45.6 kg (100 lb 8 oz) IBW/kg (Calculated) : 36.3 Heparin dosing weight = 46 kg  Vital Signs: Temp: 97.8 F (36.6 C) (10/13 0257) Temp Source: Oral (10/13 0257) BP: 117/69 (10/13 0257) Pulse Rate: 75 (10/13 0257)  Labs: Recent Labs    03/07/21 1058 03/08/21 0245 03/09/21 0306 03/09/21 0715 03/09/21 1523 03/10/21 0232  HGB 11.6*  --   --  10.7*  --  10.8*  HCT 35.7*  --   --  32.4*  --  32.6*  PLT 201  --   --  237  --  267  HEPARINUNFRC  --   --   --   --  <0.10* <0.10*  CREATININE  --  1.28* 1.19*  --   --  1.36*  TROPONINIHS  --   --  107* 99*  --   --      Estimated Creatinine Clearance: 16.7 mL/min (A) (by C-G formula based on SCr of 1.36 mg/dL (H)).  Assessment: 87 y/o F with chest pressure/neck pain.  Found to be have new onset Afib and Pharmacy consulted to dose IV heparin.  Heparin level remains undetectable on gtt at 650 units/hr. No issues with line or bleeding reported per RN.  Goal of Therapy:  Heparin level 0.3-0.7 units/ml Monitor platelets by anticoagulation protocol: Yes   Plan:  Heparin 1000 units IV bolus Increase heparin drip to 800 units/hr Check 8 hr heparin level  Sherlon Handing, PharmD, BCPS Please see amion for complete clinical pharmacist phone list 03/10/2021, 3:43 AM

## 2021-03-10 NOTE — Progress Notes (Signed)
Progress Note  Patient Name: Candace Ross Date of Encounter: 03/10/2021  Attending physician: Lind Covert, MD Primary care provider: Buzzy Han, MD Primary Cardiologist: Dr. Einar Gip  Consultant:Meilech Virts Terri Skains, DO  Subjective: Candace Ross is a 87 y.o. female who was seen and examined at bedside. Also being seen by palliative care team Patient denies any chest pain over the last 24 hours. Shortness of breath remains at baseline Atrial fibrillation on telemetry with better ventricular rate control  Objective: Vital Signs in the last 24 hours: Temp:  [97.6 F (36.4 C)-97.8 F (36.6 C)] 97.6 F (36.4 C) (10/13 1049) Pulse Rate:  [75-98] 98 (10/13 1049) Resp:  [20-23] 20 (10/13 1049) BP: (102-131)/(59-69) 116/65 (10/13 1049) SpO2:  [92 %-100 %] 93 % (10/13 1049) Weight:  [45.6 kg] 45.6 kg (10/13 0014)  Intake/Output:  Intake/Output Summary (Last 24 hours) at 03/10/2021 1812 Last data filed at 03/10/2021 1454 Gross per 24 hour  Intake 1420.57 ml  Output 1150 ml  Net 270.57 ml    Net IO Since Admission: 710.67 mL [03/10/21 1812]  Weights:  Filed Weights   03/08/21 0009 03/09/21 0557 03/10/21 0014  Weight: 45.9 kg 45.9 kg 45.6 kg    Telemetry: Personally reviewed.  Atrial fibrillation  Physical examination: PHYSICAL EXAM: Vitals with BMI 03/10/2021 03/10/2021 03/10/2021  Height - - -  Weight - - -  BMI - - -  Systolic 500 938 182  Diastolic 65 64 69  Pulse 98 81 75    CONSTITUTIONAL: Appears older than stated age, frail, hemodynamically stable, no acute distress  SKIN: Skin is warm and dry. No rash noted. No cyanosis. No pallor. No jaundice HEAD: Normocephalic and atraumatic.  EYES: No scleral icterus MOUTH/THROAT: Moist oral membranes.  NECK: No JVP. No thyromegaly noted. No carotid bruits  LYMPHATIC: No visible cervical adenopathy.  CHEST Normal respiratory effort. No intercostal retractions  LUNGS: Clear to auscultation  bilaterally.  No stridor. No wheezes. No rales.  CARDIOVASCULAR: Irregularly irregular, variable X9-B7, holosystolic murmur heard at the apex, no gallops or rubs ABDOMINAL: Soft, nontender, nondistended, positive bowel sounds in all 4 quadrants, no apparent ascites.  EXTREMITIES: No peripheral edema, dry skin, warm to touch HEMATOLOGIC: No significant bruising NEUROLOGIC: Oriented to person, place, and time. Nonfocal. Normal muscle tone.  PSYCHIATRIC: Normal mood and affect. Normal behavior. Cooperative  Lab Results: Hematology Recent Labs  Lab 03/07/21 1058 03/09/21 0715 03/10/21 0232  WBC 12.9* 10.2 9.5  RBC 4.01 3.72* 3.73*  HGB 11.6* 10.7* 10.8*  HCT 35.7* 32.4* 32.6*  MCV 89.0 87.1 87.4  MCH 28.9 28.8 29.0  MCHC 32.5 33.0 33.1  RDW 14.6 14.5 14.6  PLT 201 237 267    Chemistry Recent Labs  Lab 03/05/21 1246 03/06/21 0406 03/07/21 0334 03/08/21 0245 03/09/21 0306 03/10/21 0232  NA  --  131*   < > 126* 124* 125*  K  --  3.9   < > 4.6 4.4 3.8  CL  --  98   < > 94* 92* 92*  CO2  --  22   < > 21* 22 22  GLUCOSE  --  119*   < > 108* 137* 135*  BUN  --  27*   < > 31* 32* 34*  CREATININE  --  1.06*   < > 1.28* 1.19* 1.36*  CALCIUM  --  9.0   < > 9.1 8.7* 8.7*  PROT 6.5 6.4*  --   --   --   --  ALBUMIN 3.6 3.3*  --   --   --   --   AST 24 17  --   --   --   --   ALT 11 10  --   --   --   --   ALKPHOS 61 56  --   --   --   --   BILITOT 1.5* 1.8*  --   --   --   --   GFRNONAA  --  49*   < > 39* 43* 37*  ANIONGAP  --  11   < > _0 < > = values in this interval not displayed.     Cardiac Enzymes: Cardiac Panel (last 3 results) Recent Labs    03/09/21 0306 03/09/21 0715  TROPONINIHS 107* 99*    BNP (last 3 results) Recent Labs    03/08/21 0245 03/09/21 0306 03/10/21 0232  BNP 1,124.4* 1,573.4* 2,289.7*    ProBNP (last 3 results) No results for input(s): PROBNP in the last 8760 hours.   DDimer No results for input(s): DDIMER in the last 168  hours.   Hemoglobin A1c: No results found for: HGBA1C, MPG  TSH  Recent Labs    03/05/21 0502  TSH 2.162    Lipid Panel     Component Value Date/Time   CHOL 157 05/06/2020 1354   TRIG 136 05/06/2020 1354   HDL 65 05/06/2020 1354   CHOLHDL 3.4 01/22/2016 0447   VLDL 26 01/22/2016 0447   LDLCALC 69 05/06/2020 1354    Imaging: DG Chest 2 View  Result Date: 03/09/2021 CLINICAL DATA:  Chest pain. EXAM: CHEST - 2 VIEW COMPARISON:  03/07/2021 FINDINGS: The cardio pericardial silhouette is enlarged. Interstitial markings are diffusely coarsened with chronic features. Similar appearance of bibasilar collapse/consolidation and small bilateral pleural effusions. Bones are diffusely demineralized. Telemetry leads overlie the chest. IMPRESSION: Persistent bibasilar collapse/consolidation with small bilateral pleural effusions. Electronically Signed   By: Misty Stanley M.D.   On: 03/09/2021 09:45    Cardiac Studies: EKG: 03/05/2021: Normal sinus rhythm, 78 bpm, LAE, left axis, left bundle branch block, ST-T changes most likely secondary to LBBB. 03/09/2021: Atrial fibrillation, 82 bpm, left bundle branch block.   Echocardiogram: 10/06/2020: Normal LV systolic function with visual EF 55-60%. Left ventricle cavity is normal in size. Normal global wall motion. Basal septal hypertrophy. Diastolic dysfunction not accurate due to MAC but doppler would be consistent with grade III restrictive filling pressure and elevated LAP.   Left atrial cavity is severely dilated.  Mild to moderate aortic regurgitation. Degenerative and calcific mitral valve with subvalvular calcification. Mild to moderate prolapse, Eccentric jet wraps along the IAS consistent with severe (Grade III) mitral regurgitation. Moderate tricuspid valve regurgitation. Moderate pulmonary hypertension. RVSP measures 67 mmHg. Mild pulmonic regurgitation. IVC is dilated with a respiratory response of <50%. Compared to prior study dated  11/30/2018: LAE was moderately dilated now severe otherwise no significant change.    03/06/2021:  1. Left ventricular ejection fraction, by estimation, is 50 to 55%. The  left ventricle has low normal function. The left ventricle has no regional wall motion abnormalities. There is moderate asymmetric left ventricular hypertrophy of the basal-septal segment. Left ventricular diastolic parameters are consistent with Grade  III diastolic dysfunction (restrictive).   2. Right ventricular systolic function is mildly reduced. The right  ventricular size is mildly enlarged. There is moderately elevated  pulmonary artery systolic pressure. The estimated right ventricular  systolic pressure is  48.4 mmHg.   3. Left atrial size was severely dilated.   4. Right atrial size was mildly dilated.   5. The mitral valve is degenerative. Severe mitral valve regurgitation. Moderate mitral annular calcification.   6. Tricuspid valve regurgitation is moderate.   7. The aortic valve is tricuspid. Aortic valve regurgitation is trivial.  Mild to moderate aortic valve sclerosis/calcification is present, without any evidence of aortic stenosis.   8. The inferior vena cava is dilated in size with <50% respiratory  variability, suggesting right atrial pressure of 15 mmHg.    Stress Testing:  Myocardial perfusion imaging 01/23/2016: 1. There is a large fixed defect involving the mid and distal anterior wall, anterior septum and anterior lateral wall. No reversibility identified. 2. There is hypokinesis involving the anterior septum, anterior wall and anterior lateral wall which corresponds to the large fixed defect. 3. Left ventricular ejection fraction 60% 4. Non invasive risk stratification*: low   Heart Catheterization: 12/18/2014: RPDA lesion, 70% stenosed. PTCA and stenting of the proximal and mid circumflex with a 3.0 x 38 mm resolute integrity DES for 90% stenosis, stenting of the mid RCA with a 2.75 x 26  mm resolute integrity DES for 99% stenosis, both reduced to 0%. LM lesion, 10% stenosed. LVEF 45% with mid to distal anterolateral akinesis, inferolateral mild hypokinesis.  Scheduled Meds:  apixaban  2.5 mg Oral BID   aspirin EC  81 mg Oral QPC breakfast   atorvastatin  20 mg Oral Daily   cefdinir  300 mg Oral q1800   dapagliflozin propanediol  10 mg Oral Daily   dorzolamide  2 drop Both Eyes BID   [START ON 03/11/2021] furosemide  40 mg Oral BID   isosorbide dinitrate  30 mg Oral TID   latanoprost  2 drop Both Eyes QHS   levothyroxine  75 mcg Oral QAC breakfast   metoprolol tartrate  25 mg Oral BID   potassium chloride  20 mEq Oral BID   ranolazine  500 mg Oral Daily    Continuous Infusions:    PRN Meds: acetaminophen, bismuth subsalicylate, nitroGLYCERIN   IMPRESSION & RECOMMENDATIONS: ELISKA HAMIL is a 87 y.o. female whose past medical history and cardiac risk factors include: Coronary artery disease status post PCI proximal/mid LCx and RCA July 2016, history of HFpEF, severe mitral regurgitation, hypertension, hyperlipidemia, asymptomatic carotid artery stenosis, postmenopausal female, advanced age.  Atrial fibrillation: Newly discovered. Rate controlled Rate control: Metoprolol Rhythm control: N/A Thromboembolic prophylaxis: Heparin CHA2DS2-VASc SCORE is 6 which correlates to 9.8% risk of stroke per year (HFpEF, hypertension, age greater than 30, aortic atherosclerosis, female gender). Receiving Toprol-XL 12.5 mg p.o. twice daily.  We will transition her to Lopressor 25 mg p.o. twice daily to hopefully control the ventricular rate more efficient. Discussed the risks, benefits, and alternatives to oral anticoagulation.  Patient understands and given high CHA2DS2-VASc score she is at risk of thromboembolic event.  Fall precautions discussed.  She would like to continue with anticoagulation upon discharge.    Acute on chronic exacerbation of HFpEF, stage C, NYHA class  II: Improving. Most likely secondary to valvular heart disease and diastolic dysfunction. Less dyspneic compared to admission Chest x-ray from the morning notes no significant vascular congestion. Currently on IV Lasix and Farxiga. Recommend transitioning to p.o. Lasix tomorrow Medications reconciled. Patient was on losartan at home.  Would focus on diuresis for now and prior to discharge consider restarting losartan or transitioning to low-dose Entresto based on hemodynamics and laboratory values.  Echocardiogram personally reviewed: preserved LVEF, grade 3 diastolic impairment, elevated LAP   Acute kidney injury. Most likely secondary to intravascular depletion due to diuresis. Rechecked morning BNP which remains elevated Continue with 1 more day of IV Lasix and will transition to p.o. tomorrow. Hold off on initiation of ARB/Arni for now. Monitor creatinine  Established coronary artery disease with prior angioplasty/stenting with stable angina pectoris Prior to the hospitalization she is noted to have angina pectoris which responds very well to sublingual nitroglycerin tablets. Her antianginal therapy has been uptitrated during this hospitalization. We did discuss considering left heart and right heart catheterization to further evaluate her coronaries as well as hemodynamics respectively; however, given her age and other comorbid conditions she would like to hold off on additional invasive testing at this time after discussing the risks, benefits, and limitations of studies. Continue to uptitrate antianginal therapy for symptom management Monitor for now.   Mitral regurgitation: Severe per transthoracic echo. In the past patient has discussed possible intervention with her primary cardiologist Dr. Einar Gip.  However, given her age and comorbid conditions patient has chose to treated medically.  She still feels the same. Monitor for now.  Left bundle branch block: Chronic and stable    Asymptomatic carotid artery stenosis: Bilateral carotid bruits. Asymptomatic. To minimize bleeding risk will discontinue aspirin 81 mg p.o. daily.  As the patient has been started on Eliquis given the new diagnosis of A. fib. Continue statin therapy. Monitor for now   Secondary diagnoses: Hyponatremia -will defer to primary team. Hypertension - monitor blood pressures. Hyperlipidemia-continue statin therapy.  She does not endorse myalgias.  Plan of care discussed with family medicine resident Dr. Corky Sox.  Please feel free to contact me if any questions or concerns arise today.  However, starting 03/11/2021 please reach out to Dr. Virgina Jock who will follow the patient with you.  Patient's questions and concerns were addressed to her satisfaction. She voices understanding of the instructions provided during this encounter.   This note was created using a voice recognition software as a result there may be grammatical errors inadvertently enclosed that do not reflect the nature of this encounter. Every attempt is made to correct such errors.  Rex Kras, DO, [facility]. [place] Office: [phone] 03/10/2021, 6:12 PM

## 2021-03-10 NOTE — Progress Notes (Signed)
FPTS Progress Note  S: Pt complains of dyspnea on 2L [place]. Feels a tight bandlike pressure around her chest. Has discomfort in her left upper extremity.    O: BP 106/70 (BP Location: Left Arm)   Pulse 87   Temp 98.4 F (36.9 C) (Oral)   Resp (!) 23   Ht 4\' 8"  (1.422 m)   Wt 45.6 kg   SpO2 98%   BMI 22.53 kg/m   GEN: talking on the phone in mild respiratory distress CVS: regular rat, atrial fibrillation on monitor RESP: rales, pausing to speak between sentences, tachypneic  MSK: kyphosis on thoracic spine SKIN: warm, dry   A/P: Dyspnea  Acute on Chronic Heart Failure  Obtain CXR, EKG and troponin. Pt only received 1 dose of IV Lasix today around 2 PM. May need additional dose.  - Orders reviewed. Labs for AM ordered, which was adjusted as needed.    Lyndee Hensen, DO PGY-3, [facility] Night Resident  Please page [phone] with questions.

## 2021-03-10 NOTE — Discharge Instructions (Signed)
Dear Candace Ross,   Thank you for letting us participate in your care! In this section, you will find a brief hospital admission summary of why you were admitted to the hospital, what happened during your admission, your diagnosis/diagnoses, and recommended follow up.  You were admitted because you were experiencing shortness of breath and chest pain Your testing revealed that you have heart failure with mitral regurgitation (a leaky valve) and also had a pneumonia (lung infection). You were diagnosed with heart failure and community acquired pneumonia. You were treated with antibiotics and diuretics.  You were also seen by cardiology and palliative care. Cardiology recommended the addition of Farxiga, Ranexa, and Isordil. Your shortness of breath and chest pain improved and you were discharged from the hospital for meeting this goal.    POST-HOSPITAL & CARE INSTRUCTIONS Please let PCP/Specialists know of any changes in medications that were made.  Please see medications section of this packet for any medication changes.   DOCTOR'S APPOINTMENTS & FOLLOW UP Future Appointments  Date Time Provider [place]  03/18/2021 11:00 AM Alethia Berthold, PA-C [facility] None  04/28/2021  1:30 PM Adrian Prows, MD [facility] None     Thank you for choosing [facility]! Take care and be well!  [facility]  [place], [place] [address] [phone]

## 2021-03-11 DIAGNOSIS — I5043 Acute on chronic combined systolic (congestive) and diastolic (congestive) heart failure: Secondary | ICD-10-CM

## 2021-03-11 LAB — RESP PANEL BY RT-PCR (FLU A&B, COVID) ARPGX2
Influenza A by PCR: NEGATIVE
Influenza B by PCR: NEGATIVE
SARS Coronavirus 2 by RT PCR: NEGATIVE

## 2021-03-11 LAB — CBC
HCT: 33.4 % — ABNORMAL LOW (ref 36.0–46.0)
Hemoglobin: 11 g/dL — ABNORMAL LOW (ref 12.0–15.0)
MCH: 28.5 pg (ref 26.0–34.0)
MCHC: 32.9 g/dL (ref 30.0–36.0)
MCV: 86.5 fL (ref 80.0–100.0)
Platelets: 286 10*3/uL (ref 150–400)
RBC: 3.86 MIL/uL — ABNORMAL LOW (ref 3.87–5.11)
RDW: 14.4 % (ref 11.5–15.5)
WBC: 12.4 10*3/uL — ABNORMAL HIGH (ref 4.0–10.5)
nRBC: 0 % (ref 0.0–0.2)

## 2021-03-11 LAB — BASIC METABOLIC PANEL
Anion gap: 11 (ref 5–15)
BUN: 35 mg/dL — ABNORMAL HIGH (ref 8–23)
CO2: 23 mmol/L (ref 22–32)
Calcium: 8.9 mg/dL (ref 8.9–10.3)
Chloride: 89 mmol/L — ABNORMAL LOW (ref 98–111)
Creatinine, Ser: 1.58 mg/dL — ABNORMAL HIGH (ref 0.44–1.00)
GFR, Estimated: 31 mL/min — ABNORMAL LOW (ref >60)
Glucose, Bld: 132 mg/dL — ABNORMAL HIGH (ref 70–99)
Potassium: 4.2 mmol/L (ref 3.5–5.1)
Sodium: 123 mmol/L — ABNORMAL LOW (ref 135–145)

## 2021-03-11 LAB — MAGNESIUM: Magnesium: 2.2 mg/dL (ref 1.7–2.4)

## 2021-03-11 MED ORDER — CEFDINIR 300 MG PO CAPS
300.0000 mg | ORAL_CAPSULE | Freq: Every day | ORAL | Status: AC
  Administered 2021-03-11: 300 mg via ORAL
  Filled 2021-03-11: qty 1

## 2021-03-11 MED ORDER — POTASSIUM CHLORIDE 20 MEQ PO PACK: 20.0000 meq | PACK | Freq: Two times a day (BID) | ORAL | 1 refills | Status: AC

## 2021-03-11 MED ORDER — ISOSORBIDE DINITRATE 30 MG PO TABS: 30.0000 mg | ORAL_TABLET | Freq: Three times a day (TID) | ORAL | 1 refills | Status: AC

## 2021-03-11 MED ORDER — DAPAGLIFLOZIN PROPANEDIOL 10 MG PO TABS: 10.0000 mg | ORAL_TABLET | Freq: Every day | ORAL | 1 refills | Status: AC

## 2021-03-11 MED ORDER — METOPROLOL TARTRATE 25 MG PO TABS: 25.0000 mg | ORAL_TABLET | Freq: Two times a day (BID) | ORAL | 1 refills | Status: AC

## 2021-03-11 MED ORDER — APIXABAN 2.5 MG PO TABS: 2.5000 mg | ORAL_TABLET | Freq: Two times a day (BID) | ORAL | 1 refills | Status: AC

## 2021-03-11 MED ORDER — FUROSEMIDE 40 MG PO TABS
40.0000 mg | ORAL_TABLET | Freq: Two times a day (BID) | ORAL | 1 refills | Status: AC
Start: 2021-03-11 — End: ?

## 2021-03-11 MED ORDER — RANOLAZINE ER 500 MG PO TB12: 500.0000 mg | ORAL_TABLET | Freq: Every day | ORAL | 1 refills | Status: AC

## 2021-03-11 MED ORDER — BUSPIRONE HCL 10 MG PO TABS
10.0000 mg | ORAL_TABLET | Freq: Once | ORAL | Status: AC
  Administered 2021-03-11: 10 mg via ORAL
  Filled 2021-03-11: qty 1

## 2021-03-11 NOTE — Discharge Summary (Addendum)
[facility] Discharge Summary  Patient name: Candace Ross Medical record number: [id number] Date of birth: April 28, 1927 Age: 87 y.o. Gender: female Date of Admission: 03/05/2021  Date of Discharge: 03/11/2021 Admitting Physician: Blane Ohara McDiarmid, MD  Primary Care Provider: Buzzy Han, MD Consultants: cardiology, palliative care  Indication for Hospitalization: shortness of breath, chest pain  Discharge Diagnoses/Problem List:  HFpEF Mitral regurgitation Atrial Fibrillation, new onset CAD s/p PCI in 2016 HTN CKD 3b Breast Cancer Leukocytosis  Disposition: SNF  Discharge Condition: stable   Discharge Exam:  Physical Exam Vitals reviewed.  Constitutional:      Comments: Able to ambulate with assistance  Cardiovascular:     Rate and Rhythm: Normal rate. Rhythm irregular.     Comments: Loud holosystolic murmur Pulmonary:     Effort: Pulmonary effort is normal.     Comments: Bibasilar crackles Neurological:     Mental Status: She is alert.     Brief Hospital Course:  Candace Ross is a 87 y.o. female presenting with chest pain and dyspnea. PMH is significant for CAD s/p PCI in 2016, HFpEF, mitral regurgitation, LBBB,  HTN ,spinal stenosis w/ claudication, CKD stage III, Hypothyroidism, scoliosis and breast cancer.   Acute on Chronic CHF Exacerbation  HFpEF  09/2020 echo showing EF of 55-60% with grade III restrictive filling pressure,  severe LA dilation, severe MR. Presented with chest pain and dyspnea, BNP of 1,200, although no JVD and lower extremity edema. Was given Lasix 40 mg IV and treated with 1 dose of ceftriaxone, full course of Azithromycin. On 10/10 a procal was obtained at 0.56 and a CXR showing vascular congestion and increasing bilateral lower lobe opacities concerning for pulmonary edema. She desaturated when taken off O2. Given this, the patient was restarted on a third gen cephalosporin (cefdinir) for a 5 day  course and diuresed further with Lasix 40 mg IV BID for several days. The patient was able to tolerate resting on room air but required O2 when ambulating. It was felt that she was as euvolemic as possible given her degenerate mitral valve and increasing Cr (up to 1.58, on a baseline of 1-1.1). Notably she had persistent hyponatremia around 125 which was felt to be a poor prognostic indicator. She was transitioned to PO Lasix 40 mg BID, an increase from her home regimen of 20 mg BID, with potassium supplementation of 20 meq BID. She was started on Farxiga for guideline directed medical therapy.   Atrial Fibrillation Patient developed afib on 10/11 with heart rates consistently below 100. Started on a heparin drip. Her CHADSVASC was a 4. With shared decision making, she was started on Eliqiuis 2.5 mg BID to avoid a stroke.   Goals of care Palliative care was consulted and her code status was changed from full code to DNR.   Chest pain, CAD s/p PCI in 2016 Patient presented with acute on chronic chest pain. Low concern for ACS given flat trops. May have been acute worsening due to CHF exacerbation vs CAP. After the first day her pain dropped significantly in severity and was 3/10 in severity. Her home Imdur was changed to isordil. She was continued on her home ASA and atorvastatin. She was given PRN nitroglycerin and started on Ranexa 500 mg daily per cardiology recommendation.      Hypertension Home meds of Losartan 50 mg and Toprol 25 mg BID. Had been holding meds for soft BP. Her home Toprol was restarted.    CKD 3b  Bl cr of 1-1.13. Up to 1.32 on admission. Trended down and then up again. It was felt that her renal function was a limiting factor for further diuresis. Cr of 1.58 on discharge, which is an AKI, thought to be secondary to decreased intravascular volume due to heavy diuresis. We expect this to improve with her discharge regimen of diuretic being much lower in dose.    Hx of Breast  Cancer Was diagnosed with left breast invasive ductal Carcinoma in 2013 treated with needle localized lumpectomy.   Leukocytosis Developed a leukocytosis of 12 on the day of discharge. Infection was considered and thought to be unlikely given a CXR with no signs of pneumonia, no fever, no tachycardia.   Follow up recommendations: -Recheck BMET (adjust potassium supplementation as indicated).  -Consider scheduling Buspar for anxiety -Consider liberalizing slat intake given hyponatremia   Significant Procedures: none  Significant Labs and Imaging:  Recent Labs  Lab 03/09/21 0715 03/10/21 0232 03/11/21 0900  WBC 10.2 9.5 12.4*  HGB 10.7* 10.8* 11.0*  HCT 32.4* 32.6* 33.4*  PLT 237 267 286   Recent Labs  Lab 03/05/21 1246 03/06/21 0406 03/07/21 0334 03/08/21 0245 03/09/21 0306 03/10/21 0232 03/11/21 0900  NA  --  131* 130* 126* 124* 125* 123*  K  --  3.9 4.5 4.6 4.4 3.8 4.2  CL  --  98 97* 94* 92* 92* 89*  CO2  --  22 24 21* 22 22 23   GLUCOSE  --  119* 111* 108* 137* 135* 132*  BUN  --  27* 29* 31* 32* 34* 35*  CREATININE  --  1.06* 1.20* 1.28* 1.19* 1.36* 1.58*  CALCIUM  --  9.0 9.2 9.1 8.7* 8.7* 8.9  MG 2.0  --  2.0  --  2.1  --  2.2  ALKPHOS 61 56  --   --   --   --   --   AST 24 17  --   --   --   --   --   ALT 11 10  --   --   --   --   --   ALBUMIN 3.6 3.3*  --   --   --   --   --     Results/Tests Pending at Time of Discharge: none  Discharge Medications:  Allergies as of 03/11/2021       Reactions   Penicillins Other (See Comments)   NOT Allergy to penicillin  Allergic to FILLER ONLY  Non active ingredient   Other Other (See Comments)   "some medicine they gave me in the hospital, it was a little purple sleeping  pill, to help me sleep didn't work; it made me mean"        Medication List     STOP taking these medications    aspirin 81 MG EC tablet   isosorbide mononitrate 60 MG 24 hr tablet Commonly known as: IMDUR   losartan 50 MG  tablet Commonly known as: COZAAR   MAGNESIUM PO   metoprolol succinate 25 MG 24 hr tablet Commonly known as: TOPROL-XL   potassium chloride 8 MEQ tablet Commonly known as: KLOR-CON Replaced by: potassium chloride 20 MEQ packet       TAKE these medications    acetaminophen 500 MG tablet Commonly known as: TYLENOL Take 500 mg by mouth every 6 (six) hours as needed for headache (pain).   apixaban 2.5 MG Tabs tablet Commonly known as: ELIQUIS Take 1 tablet (2.5 mg  total) by mouth 2 (two) times daily.   atorvastatin 20 MG tablet Commonly known as: LIPITOR TAKE 1 TABLET EVERY DAY What changed: when to take this   dapagliflozin propanediol 10 MG Tabs tablet Commonly known as: FARXIGA Take 1 tablet (10 mg total) by mouth daily. Start taking on: March 12, 2021   dorzolamide 2 % ophthalmic solution Commonly known as: TRUSOPT Place 2 drops into both eyes 2 (two) times daily.   furosemide 40 MG tablet Commonly known as: LASIX Take 1 tablet (40 mg total) by mouth 2 (two) times daily. What changed:  medication strength how much to take when to take this additional instructions   IRON PO Take 1 tablet by mouth once a week.   isosorbide dinitrate 30 MG tablet Commonly known as: ISORDIL Take 1 tablet (30 mg total) by mouth 3 (three) times daily.   latanoprost 0.005 % ophthalmic solution Commonly known as: XALATAN Place 2 drops into both eyes at bedtime.   levothyroxine 75 MCG tablet Commonly known as: SYNTHROID Take 75 mcg by mouth daily before breakfast.   metoprolol tartrate 25 MG tablet Commonly known as: LOPRESSOR Take 1 tablet (25 mg total) by mouth 2 (two) times daily.   nitroGLYCERIN 0.4 MG SL tablet Commonly known as: NITROSTAT Place 1 tablet (0.4 mg total) under the tongue every 5 (five) minutes as needed for chest pain.   potassium chloride 20 MEQ packet Commonly known as: KLOR-CON Take 20 mEq by mouth 2 (two) times daily. Replaces: potassium  chloride 8 MEQ tablet   ranolazine 500 MG 12 hr tablet Commonly known as: RANEXA Take 1 tablet (500 mg total) by mouth daily. Start taking on: March 12, 2021   VITAMIN B-12 PO Take 1 tablet by mouth once a week.   VITAMIN D3 PO Take 1 tablet by mouth daily.   VITAMIN E PO Take 1 capsule by mouth every other day.        Discharge Instructions: Please refer to Patient Instructions section of EMR for full details.  Patient was counseled important signs and symptoms that should prompt return to medical care, changes in medications, dietary instructions, activity restrictions, and follow up appointments.   Follow-Up Appointments:  Follow-up Information     Cantwell, Celeste C, PA-C. Go on 03/18/2021.   Specialty: Cardiology Why: Appointment at 11:00 AM. Contact information: [address] [place] [address] [place] PGY-1 [facility] pager: [phone], text pages welcome   FPTS Upper-Level Resident Addendum   I have independently interviewed and examined the patient. I have discussed the above with the original author and agree with their documentation. Please see also any attending notes.    Carollee Leitz, MD Family Medicine PGY-3

## 2021-03-11 NOTE — Plan of Care (Signed)
  Problem: Education: Goal: Knowledge of General Education information will improve Description: Including pain rating scale, medication(s)/side effects and non-pharmacologic comfort measures Outcome: Adequate for Discharge   Problem: Health Behavior/Discharge Planning: Goal: Ability to manage health-related needs will improve Outcome: Adequate for Discharge   Problem: Clinical Measurements: Goal: Ability to maintain clinical measurements within normal limits will improve Outcome: Adequate for Discharge Goal: Will remain free from infection Outcome: Adequate for Discharge Goal: Diagnostic test results will improve Outcome: Adequate for Discharge Goal: Respiratory complications will improve Outcome: Adequate for Discharge Goal: Cardiovascular complication will be avoided Outcome: Adequate for Discharge   Problem: Activity: Goal: Risk for activity intolerance will decrease Outcome: Adequate for Discharge   Problem: Nutrition: Goal: Adequate nutrition will be maintained Outcome: Adequate for Discharge   Problem: Coping: Goal: Level of anxiety will decrease Outcome: Adequate for Discharge   Problem: Elimination: Goal: Will not experience complications related to bowel motility Outcome: Adequate for Discharge Goal: Will not experience complications related to urinary retention Outcome: Adequate for Discharge   Problem: Pain Managment: Goal: General experience of comfort will improve Outcome: Adequate for Discharge   Problem: Safety: Goal: Ability to remain free from injury will improve Outcome: Adequate for Discharge   Problem: Skin Integrity: Goal: Risk for impaired skin integrity will decrease Outcome: Adequate for Discharge   Problem: Acute Rehab PT Goals(only PT should resolve) Goal: Pt Will Go Supine/Side To Sit Outcome: Adequate for Discharge Goal: Patient Will Transfer Sit To/From Stand Outcome: Adequate for Discharge Goal: Pt Will Ambulate Outcome: Adequate  for Discharge Goal: Pt/caregiver will Perform Home Exercise Program Outcome: Adequate for Discharge   Problem: Acute Rehab OT Goals (only OT should resolve) Goal: Pt. Will Perform Lower Body Bathing Outcome: Adequate for Discharge Goal: Pt. Will Perform Lower Body Dressing Outcome: Adequate for Discharge Goal: Pt. Will Transfer To Toilet Outcome: Adequate for Discharge Goal: Pt/Caregiver Will Perform Home Exercise Program Outcome: Adequate for Discharge Goal: OT Additional ADL Goal #1 Outcome: Adequate for Discharge   Problem: Education: Goal: Ability to demonstrate management of disease process will improve Outcome: Adequate for Discharge Goal: Ability to verbalize understanding of medication therapies will improve Outcome: Adequate for Discharge Goal: Individualized Educational Video(s) Outcome: Adequate for Discharge   Problem: Activity: Goal: Capacity to carry out activities will improve Outcome: Adequate for Discharge   Problem: Cardiac: Goal: Ability to achieve and maintain adequate cardiopulmonary perfusion will improve Outcome: Adequate for Discharge

## 2021-03-11 NOTE — TOC Transition Note (Signed)
Transition of Care [facility]) - CM/SW Discharge Note   Patient Details  Name: Candace Ross MRN: [id number] Date of Birth: 1926-08-24  Transition of Care [facility]) CM/SW Contact:  Tresa Endo Phone Number: 03/11/2021, 12:09 PM   Clinical Narrative:    Patient will DC to: [facility] Anticipated DC date: 03/11/2021 Family notified: Pt Brother Transport by: Corey Harold   Per MD patient ready for DC to [phone]. [phone] to call report prior to discharge [phone]). RN, patient, patient's family, and facility notified of DC. Discharge Summary and FL2 sent to facility. DC packet on chart. Ambulance transport requested for patient.   CSW will sign off for now as social work intervention is no longer needed. Please consult [place] again if new needs arise.       Barriers to Discharge: Continued Medical Work up, Ship broker   Patient Goals and CMS Choice Patient states their goals for this hospitalization and ongoing recovery are:: To get stronger and return home CMS Medicare.gov Compare Post Acute Care list provided to:: Patient Choice offered to / list presented to : Patient  Discharge Placement                       Discharge Plan and Services                                     Social Determinants of Health (SDOH) Interventions     Readmission Risk Interventions No flowsheet data found.

## 2021-03-11 NOTE — Progress Notes (Signed)
Progress Note  Patient Name: Candace Ross Date of Encounter: 03/11/2021  Attending physician: Lind Covert, MD Primary care provider: Buzzy Han, MD Primary Cardiologist: Dr. Einar Gip  Consultant:Aviyanna Colbaugh C Teresa Nicodemus, PA-C  Subjective: Candace Ross is a 87 y.o. female who was seen and examined at bedside. Also being seen by palliative care team Last night patient complained of dyspnea on 2 L via nasal cannula and reported chest pressure.  Therefore chest x-ray, EKG, and troponin were ordered.  Chest x-ray revealed mild progression of CHF, troponin has continued to trend down at 45, 49, and EKG without acute ischemic changes.  Patient was given an additional dose of Lasix 40 mg IV. Upon further questioning this morning patient states her primary symptom last night was mild chest discomfort. Presently denies chest pain.  Dyspnea remains at baseline. Atrial fibrillation with controlled ventricular response on telemetry  At time of exam this morning patient states she is ready to discharge to rehab facility and is anxious to get out of the hospital.  Objective: Vital Signs in the last 24 hours: Temp:  [97.6 F (36.4 C)-98.4 F (36.9 C)] 98.1 F (36.7 C) (10/14 0301) Pulse Rate:  [81-98] 87 (10/13 1932) Resp:  [20-25] 25 (10/14 0301) BP: (106-117)/(60-70) 112/60 (10/14 0301) SpO2:  [93 %-100 %] 100 % (10/14 0301) Weight:  [45.8 kg] 45.8 kg (10/14 0301)  Intake/Output:  Intake/Output Summary (Last 24 hours) at 03/11/2021 0749 Last data filed at 03/10/2021 2320 Gross per 24 hour  Intake 1820 ml  Output 350 ml  Net 1470 ml    Net IO Since Admission: 1,590.67 mL [03/11/21 0749]  Weights:  Filed Weights   03/09/21 0557 03/10/21 0014 03/11/21 0301  Weight: 45.9 kg 45.6 kg 45.8 kg    Telemetry: Personally reviewed.  Atrial fibrillation with controlled ventricular response  Physical examination: PHYSICAL EXAM: Vitals with BMI 03/11/2021 03/10/2021  03/10/2021  Height - - -  Weight 100 lbs 14 oz - -  BMI 96.28 - -  Systolic 366 294 765  Diastolic 60 70 65  Pulse - 87 98   CONSTITUTIONAL: Appears older than stated age, frail, hemodynamically stable, no acute distress  SKIN: Skin is warm and dry. No rash noted. No cyanosis. No pallor. No jaundice HEAD: Normocephalic and atraumatic.  EYES: No scleral icterus MOUTH/THROAT: Moist oral membranes.  NECK: No JVP. No thyromegaly noted. No carotid bruits  LYMPHATIC: No visible cervical adenopathy.  CHEST Normal respiratory effort. No intercostal retractions  LUNGS: Clear to auscultation bilaterally.  No stridor. No wheezes. No rales.  CARDIOVASCULAR: Irregularly irregular, variable Y6-T0, holosystolic murmur heard at the apex, no gallops or rubs ABDOMINAL: Soft, nontender, nondistended, positive bowel sounds in all 4 quadrants, no apparent ascites.  EXTREMITIES: No peripheral edema, dry skin, warm to touch HEMATOLOGIC: No significant bruising NEUROLOGIC: Oriented to person, place, and time. Nonfocal. Normal muscle tone.  PSYCHIATRIC: Normal mood and affect. Normal behavior. Cooperative  Lab Results: Hematology Recent Labs  Lab 03/07/21 1058 03/09/21 0715 03/10/21 0232  WBC 12.9* 10.2 9.5  RBC 4.01 3.72* 3.73*  HGB 11.6* 10.7* 10.8*  HCT 35.7* 32.4* 32.6*  MCV 89.0 87.1 87.4  MCH 28.9 28.8 29.0  MCHC 32.5 33.0 33.1  RDW 14.6 14.5 14.6  PLT 201 237 267    Chemistry Recent Labs  Lab 03/05/21 1246 03/06/21 0406 03/07/21 0334 03/08/21 0245 03/09/21 0306 03/10/21 0232  NA  --  131*   < > 126* 124* 125*  K  --  3.9   < > 4.6 4.4 3.8  CL  --  98   < > 94* 92* 92*  CO2  --  22   < > 21* 22 22  GLUCOSE  --  119*   < > 108* 137* 135*  BUN  --  27*   < > 31* 32* 34*  CREATININE  --  1.06*   < > 1.28* 1.19* 1.36*  CALCIUM  --  9.0   < > 9.1 8.7* 8.7*  PROT 6.5 6.4*  --   --   --   --   ALBUMIN 3.6 3.3*  --   --   --   --   AST 24 17  --   --   --   --   ALT 11 10  --   --    --   --   ALKPHOS 61 56  --   --   --   --   BILITOT 1.5* 1.8*  --   --   --   --   GFRNONAA  --  49*   < > 39* 43* 37*  ANIONGAP  --  11   < > _0 < > = values in this interval not displayed.     Cardiac Enzymes: Cardiac Panel (last 3 results) Recent Labs    03/09/21 0715 03/10/21 2100 03/10/21 2252  TROPONINIHS 99* 45* 49*    BNP (last 3 results) Recent Labs    03/08/21 0245 03/09/21 0306 03/10/21 0232  BNP 1,124.4* 1,573.4* 2,289.7*    ProBNP (last 3 results) No results for input(s): PROBNP in the last 8760 hours.   DDimer No results for input(s): DDIMER in the last 168 hours.   Hemoglobin A1c: No results found for: HGBA1C, MPG  TSH  Recent Labs    03/05/21 0502  TSH 2.162    Lipid Panel     Component Value Date/Time   CHOL 157 05/06/2020 1354   TRIG 136 05/06/2020 1354   HDL 65 05/06/2020 1354   CHOLHDL 3.4 01/22/2016 0447   VLDL 26 01/22/2016 0447   LDLCALC 69 05/06/2020 1354    Imaging: DG Chest 1 View  Result Date: 03/10/2021 CLINICAL DATA:  Dyspnea, congestive heart failure EXAM: CHEST  1 VIEW COMPARISON:  03/09/2021 FINDINGS: Single frontal view of the chest demonstrates persistent enlargement of the cardiac silhouette. There is persistent central vascular congestion, with increasing interstitial prominence and bibasilar consolidation and effusions, consistent with worsening congestive heart failure. No pneumothorax. IMPRESSION: 1. Mild progression of congestive heart failure. Electronically Signed   By: Randa Ngo M.D.   On: 03/10/2021 22:14    Cardiac Studies: EKG: 03/05/2021: Normal sinus rhythm, 78 bpm, LAE, left axis, left bundle branch block, ST-T changes most likely secondary to LBBB. 03/09/2021: Atrial fibrillation, 82 bpm, left bundle branch block. 03/10/2021: Atrial fibrillation at a rate of 87 bpm.  Left axis.  Left bundle branch block with secondary ST-T wave changes.  Echocardiogram: 10/06/2020: Normal LV systolic  function with visual EF 55-60%. Left ventricle cavity is normal in size. Normal global wall motion. Basal septal hypertrophy. Diastolic dysfunction not accurate due to MAC but doppler would be consistent with grade III restrictive filling pressure and elevated LAP.   Left atrial cavity is severely dilated.  Mild to moderate aortic regurgitation. Degenerative and calcific mitral valve with subvalvular calcification. Mild to moderate prolapse, Eccentric jet wraps along the IAS consistent with severe (Grade III) mitral regurgitation.  Moderate tricuspid valve regurgitation. Moderate pulmonary hypertension. RVSP measures 67 mmHg. Mild pulmonic regurgitation. IVC is dilated with a respiratory response of <50%. Compared to prior study dated 11/30/2018: LAE was moderately dilated now severe otherwise no significant change.    03/06/2021:  1. Left ventricular ejection fraction, by estimation, is 50 to 55%. The  left ventricle has low normal function. The left ventricle has no regional wall motion abnormalities. There is moderate asymmetric left ventricular hypertrophy of the basal-septal segment. Left ventricular diastolic parameters are consistent with Grade  III diastolic dysfunction (restrictive).   2. Right ventricular systolic function is mildly reduced. The right  ventricular size is mildly enlarged. There is moderately elevated  pulmonary artery systolic pressure. The estimated right ventricular  systolic pressure is 38.2 mmHg.   3. Left atrial size was severely dilated.   4. Right atrial size was mildly dilated.   5. The mitral valve is degenerative. Severe mitral valve regurgitation. Moderate mitral annular calcification.   6. Tricuspid valve regurgitation is moderate.   7. The aortic valve is tricuspid. Aortic valve regurgitation is trivial.  Mild to moderate aortic valve sclerosis/calcification is present, without any evidence of aortic stenosis.   8. The inferior vena cava is dilated in  size with <50% respiratory  variability, suggesting right atrial pressure of 15 mmHg.    Stress Testing:  Myocardial perfusion imaging 01/23/2016: 1. There is a large fixed defect involving the mid and distal anterior wall, anterior septum and anterior lateral wall. No reversibility identified. 2. There is hypokinesis involving the anterior septum, anterior wall and anterior lateral wall which corresponds to the large fixed defect. 3. Left ventricular ejection fraction 60% 4. Non invasive risk stratification*: low   Heart Catheterization: 12/18/2014: RPDA lesion, 70% stenosed. PTCA and stenting of the proximal and mid circumflex with a 3.0 x 38 mm resolute integrity DES for 90% stenosis, stenting of the mid RCA with a 2.75 x 26 mm resolute integrity DES for 99% stenosis, both reduced to 0%. LM lesion, 10% stenosed. LVEF 45% with mid to distal anterolateral akinesis, inferolateral mild hypokinesis.  Scheduled Meds:  apixaban  2.5 mg Oral BID   atorvastatin  20 mg Oral Daily   cefdinir  300 mg Oral q1800   dapagliflozin propanediol  10 mg Oral Daily   dorzolamide  2 drop Both Eyes BID   furosemide  40 mg Oral BID   isosorbide dinitrate  30 mg Oral TID   latanoprost  2 drop Both Eyes QHS   levothyroxine  75 mcg Oral QAC breakfast   metoprolol tartrate  25 mg Oral BID   potassium chloride  20 mEq Oral BID   ranolazine  500 mg Oral Daily    Continuous Infusions:   PRN Meds: acetaminophen, bismuth subsalicylate, nitroGLYCERIN   IMPRESSION & RECOMMENDATIONS: SAPHYRA HUTT is a 87 y.o. female whose past medical history and cardiac risk factors include: Coronary artery disease status post PCI proximal/mid LCx and RCA July 2016, history of HFpEF, severe mitral regurgitation, hypertension, hyperlipidemia, asymptomatic carotid artery stenosis, postmenopausal female, advanced age.  Atrial fibrillation: Newly discovered. Rate controlled Rate control: Lopressor 25 mg p.o. twice  daily Rhythm control: N/A Thromboembolic prophylaxis: Eliquis CHA2DS2-VASc SCORE is 6 which correlates to 9.8% risk of stroke per year (HFpEF, hypertension, age greater than 21, aortic atherosclerosis, female gender). Patient is aware of risks, benefits, and alternatives to oral anticoagulation.  She wishes to continue anticoagulation upon discharge.  Acute on chronic exacerbation of HFpEF, stage C,  NYHA class II: Echocardiogram: Preserved LVEF, grade 3 diastolic impairment, elevated LAP Most likely secondary to valvular heart disease and diastolic dysfunction. Will the patient's BNP remains high and there is some concern for vascular congestion on chest x-ray patient's symptoms have significantly improved since admission and she is presently maximized on medical therapy at this time Continue Lasix 80 mg p.o. twice daily Patient was on losartan prior to admission which has been held.  Given AKI will hold off on reinitiation of ARB/Arni at this time.   Acute kidney injury. Likely secondary to intravascular depletion due to diuresis Continue Lasix 40 mg p.o. twice daily Hold off on initiation of ARB/Arni for now Recommend outpatient follow-up on renal function  Established coronary artery disease with prior angioplasty/stenting with stable angina pectoris Prior to admission patient has history of chronic stable angina pectoris which is resolved with sublingual nitroglycerin Added Ranexa during this hospitalization.   Patient does not wish to undergo additional invasive testing given her advanced age and comorbid conditions, therefore held off on left and right heart catheterization. Continue present local therapy   Mitral regurgitation: Severe per transthoracic echo. Patient has discussed possible intervention with primary cardiologist Dr. Einar Gip.  Given her age and comorbid conditions patient has opted to be treated medically, she continues to wish for the same. And her on an outpatient  basis  Left bundle branch block: Chronic and stable   Asymptomatic carotid artery stenosis: Continue statin therapy Patient not on aspirin given that she is now on Eliquis for new diagnosis of atrial fibrillation Follow-up outpatient   Secondary diagnoses: Hyponatremia -will defer to primary team. Hypertension - monitor blood pressures. Hyperlipidemia-continue statin therapy.   Patient is stable for discharge to rehab facility from a cardiology standpoint.  Patient is maximized on medical therapy at this time and symptoms have significantly improved since admission. Patient will follow up with our office outpatient.   Plan of care discussed with family medicine resident Dr. Corky Sox. Please reach out to Dr. Virgina Jock who will follow the patient with you.  Patient was seen in collaboration with Dr. Virgina Jock. He also reviewed patient's chart and examined the patient. Dr. Virgina Jock is in agreement of the plan.    Alethia Berthold, PA-C 03/11/2021, 8:46 AM Office: [phone]

## 2021-03-14 ENCOUNTER — Telehealth: Payer: Self-pay

## 2021-03-14 NOTE — Telephone Encounter (Signed)
Spoke to patient's brother who is concerned as patient is in [facility] facility and continues to have shortness of breath.  Also reports the patient has gained 3 pounds over the last 1 week.  Advised patient's father that our office will reach out to [facility] to get a clinical update on patient's status.

## 2021-03-14 NOTE — Telephone Encounter (Signed)
Patient developed atrial fibrillation in the hospital, which increases risk of stroke, therefore shared decision was to put her on a blood thinner to reduce the risk of stroke.

## 2021-03-15 ENCOUNTER — Emergency Department (HOSPITAL_COMMUNITY)
Admission: EM | Admit: 2021-03-15 | Discharge: 2021-03-29 | Disposition: E | Payer: Medicare HMO | Attending: Emergency Medicine | Admitting: Emergency Medicine

## 2021-03-15 DIAGNOSIS — I130 Hypertensive heart and chronic kidney disease with heart failure and stage 1 through stage 4 chronic kidney disease, or unspecified chronic kidney disease: Secondary | ICD-10-CM | POA: Insufficient documentation

## 2021-03-15 DIAGNOSIS — I25118 Atherosclerotic heart disease of native coronary artery with other forms of angina pectoris: Secondary | ICD-10-CM | POA: Insufficient documentation

## 2021-03-15 DIAGNOSIS — Z7901 Long term (current) use of anticoagulants: Secondary | ICD-10-CM | POA: Diagnosis not present

## 2021-03-15 DIAGNOSIS — Z853 Personal history of malignant neoplasm of breast: Secondary | ICD-10-CM | POA: Insufficient documentation

## 2021-03-15 DIAGNOSIS — I5033 Acute on chronic diastolic (congestive) heart failure: Secondary | ICD-10-CM | POA: Diagnosis not present

## 2021-03-15 DIAGNOSIS — N183 Chronic kidney disease, stage 3 unspecified: Secondary | ICD-10-CM | POA: Diagnosis not present

## 2021-03-15 DIAGNOSIS — I469 Cardiac arrest, cause unspecified: Secondary | ICD-10-CM | POA: Insufficient documentation

## 2021-03-15 DIAGNOSIS — Z79899 Other long term (current) drug therapy: Secondary | ICD-10-CM | POA: Diagnosis not present

## 2021-03-15 DIAGNOSIS — I4891 Unspecified atrial fibrillation: Secondary | ICD-10-CM | POA: Diagnosis not present

## 2021-03-18 ENCOUNTER — Ambulatory Visit: Payer: Medicare HMO | Admitting: Student

## 2021-03-24 ENCOUNTER — Other Ambulatory Visit: Payer: Self-pay | Admitting: Family Medicine

## 2021-03-24 DIAGNOSIS — I5043 Acute on chronic combined systolic (congestive) and diastolic (congestive) heart failure: Secondary | ICD-10-CM

## 2021-03-29 NOTE — ED Notes (Signed)
[facility] notified and was [facility]. Spoke with [facility] Ref # [id number].

## 2021-03-29 NOTE — ED Triage Notes (Signed)
Pt brought in by [facility] with a c/o CP that started at 13:30 today. Pt received 3 NTG by facility and when EMS arrived pt had a HR in low 30s and hypotensive.Pt also started having AMS. EMS began external pacing and admin 1.25 midazolam. NPA in place. Dr Gilford Raid on the phone with family while receiving pt from EMS. Per Dr. Gilford Raid external pacing d/c'd Pt with S. Brady rate at 90.

## 2021-03-29 NOTE — ED Notes (Signed)
Pt asystole  in leades I, II, and III  at this time. Time of death noted by Dr. Gilford Raid.

## 2021-03-29 NOTE — Telephone Encounter (Signed)
Attempted to call [employer]. No answer from nurse and unable to leave vm requesting call back. Will try again later today.

## 2021-03-29 NOTE — Telephone Encounter (Signed)
Location of hospitalization: [place] [facility] Reason for hospitalization: SOB and chest pain Date of discharge: 03/11/2021 Date of first communication with patient: today Person contacting patient: Me Current symptoms: SOB and swelling Do you understand why you were in the Hospital: Yes Questions regarding discharge instructions: None Where were you discharged to: Home Medications reviewed: Yes Allergies reviewed: Yes Dietary changes reviewed: Yes. Discussed low fat and low salt diet.  Referals reviewed: NA Activities of Daily Living: Able to with mild limitations Any transportation issues/concerns: None Any patient concerns: None Confirmed importance & date/time of Follow up appt: Yes Confirmed with patient if condition begins to worsen call. Pt was given the office number and encouraged to call back with questions or concerns: Yes

## 2021-03-29 NOTE — ED Triage Notes (Signed)
Pt placed on comfort care.   °

## 2021-03-29 NOTE — ED Provider Notes (Signed)
[facility] EMERGENCY DEPARTMENT Provider Note   CSN: [id number] Arrival date & time: 03-30-2021  1836     History No chief complaint on file.   Candace Ross is a 87 y.o. female.  Pt presents to the ED today with bradycardia, hypotension, and AMS.  Pt is from a SNF and complained of CP.  She was given 3 nitro by the facility.  EMS was called and noted HR in the low 30s.  She became very agitated, so she was given 1.25 mg versed.  EMS began pacing patient and put pt on a 15L NRB.  Pt arrived with a DNR form.      Past Medical History:  Diagnosis Date   Anemia    Atrial fibrillation ([place]) 03/09/2021   Back pain    Breast cancer ([place])    Change in stool habits    Dribbling urine    H/O bone density study 2009   Hearing loss    Hypertension    Osteoporosis    Sinus problem    SOB (shortness of breath) on exertion    stairs   Spinal stenosis    Thyroid disease    Wears dentures     Patient Active Problem List   Diagnosis Date Noted   AKI (acute kidney injury) ([place])    Atrial fibrillation ([place]) 03/09/2021   Macular degeneration 03/07/2021   Visual impairment 03/07/2021   Hard of hearing 03/07/2021   Chest pain 03/06/2021   Community acquired pneumonia    Dyspnea 03/05/2021   Acute on chronic heart failure with preserved ejection fraction (HFpEF) ([place])    Atherosclerosis of native coronary artery of native heart with stable angina pectoris ([place])    History of coronary angioplasty with insertion of stent    Severe mitral regurgitation    LBBB (left bundle branch block)    Bilateral carotid artery stenosis    Hyponatremia    Leukocytosis    Hypertensive heart and kidney disease with HF and with CKD stage III ([place])    Mixed hyperlipidemia    Acute on chronic diastolic heart failure ([place]) 11/29/2018   Benign hypertension 11/29/2018   Shortness of breath 11/29/2018   Acute on chronic combined systolic and diastolic congestive heart failure ([place])  11/29/2018   Spinal stenosis of lumbar region with neurogenic claudication 01/23/2017   Scoliosis of thoracolumbar spine 01/23/2017   Lumbar radiculopathy 01/23/2017   CAD (coronary artery disease), native coronary artery 12/18/2014   Angina pectoris ([place]) 12/17/2014   Cancer of central portion of female breast ([place]) 10/20/2011    Past Surgical History:  Procedure Laterality Date   BARTHOLIN GLAND CYST EXCISION  1969   BREAST LUMPECTOMY  05/02/77   benign   CARDIAC CATHETERIZATION N/A 12/18/2014   Procedure: Left Heart Cath and Coronary Angiography;  Surgeon: Adrian Prows, MD;  Location: [facility];  Service: Cardiovascular;  Laterality: N/A;   CARDIAC CATHETERIZATION N/A 12/18/2014   Procedure: Coronary Stent Intervention;  Surgeon: Adrian Prows, MD;  Location: [facility];  Service: Cardiovascular;  Laterality: N/A;   HERNIA REPAIR  12/15/99     OB History   No obstetric history on file.     Family History  Problem Relation Age of Onset   Pneumonia Father    Colon cancer Brother     Social History   Tobacco Use   Smoking status: Never   Smokeless tobacco: Never  Vaping Use   Vaping Use: Never used  Substance Use Topics   Alcohol use: No   Drug use: No    Home Medications Prior to Admission medications   Medication Sig Start Date End Date Taking? Authorizing Provider  acetaminophen (TYLENOL) 500 MG tablet Take 500 mg by mouth every 6 (six) hours as needed for headache (pain).     [provider]  apixaban (ELIQUIS) 2.5 MG TABS tablet Take 1 tablet (2.5 mg total) by mouth 2 (two) times daily. 03/11/21   Alcus Dad, MD  atorvastatin (LIPITOR) 20 MG tablet TAKE 1 TABLET EVERY DAY Patient taking differently: Take 20 mg by mouth at bedtime. 05/13/20   Adrian Prows, MD  Cholecalciferol (VITAMIN D3 PO) Take 1 tablet by mouth daily.    [provider]  Cyanocobalamin (VITAMIN B-12 PO) Take 1 tablet by mouth once a week.     [provider]  dapagliflozin propanediol (FARXIGA) 10 MG TABS tablet Take 1 tablet (10 mg total) by mouth daily. 03/12/21   Alcus Dad, MD  dorzolamide (TRUSOPT) 2 % ophthalmic solution Place 2 drops into both eyes 2 (two) times daily.     [provider]  Ferrous Sulfate (IRON PO) Take 1 tablet by mouth once a week.     [provider]  furosemide (LASIX) 40 MG tablet Take 1 tablet (40 mg total) by mouth 2 (two) times daily. 03/11/21   Alcus Dad, MD  isosorbide dinitrate (ISORDIL) 30 MG tablet Take 1 tablet (30 mg total) by mouth 3 (three) times daily. 03/11/21   Alcus Dad, MD  latanoprost (XALATAN) 0.005 % ophthalmic solution Place 2 drops into both eyes at bedtime.     [provider]  levothyroxine (SYNTHROID) 75 MCG tablet Take 75 mcg by mouth daily before breakfast.    [provider]  metoprolol tartrate (LOPRESSOR) 25 MG tablet Take 1 tablet (25 mg total) by mouth 2 (two) times daily. 03/11/21   Alcus Dad, MD  nitroGLYCERIN (NITROSTAT) 0.4 MG SL tablet Place 1 tablet (0.4 mg total) under the tongue every 5 (five) minutes as needed for chest pain. 10/27/20   Adrian Prows, MD  potassium chloride (KLOR-CON) 20 MEQ packet Take 20 mEq by mouth 2 (two) times daily. 03/11/21   Alcus Dad, MD  ranolazine (RANEXA) 500 MG 12 hr tablet Take 1 tablet (500 mg total) by mouth daily. 03/12/21   Alcus Dad, MD  VITAMIN E PO Take 1 capsule by mouth every other day.     [provider]    Allergies    Penicillins and Other  Review of Systems   Review of Systems  Unable to perform ROS: Mental status change   Physical Exam Updated Vital Signs BP (!) 97/35 (BP Location: Right Arm)   Pulse (!) 53   Resp 12   Physical Exam Constitutional:      General: She is in acute distress.  HENT:     Head: Normocephalic and atraumatic.     Right Ear: External ear normal.     Left Ear: External ear normal.     Nose: Nose normal.  Eyes:      Pupils: Pupils are equal, round, and reactive to light.  Cardiovascular:     Rate and Rhythm: Regular rhythm. Bradycardia present.  Pulmonary:     Effort: Pulmonary effort is normal.     Breath sounds: Normal breath sounds.  Abdominal:     General: Abdomen is flat. Bowel sounds are normal.     Palpations: Abdomen is soft.  Musculoskeletal:        General: No deformity.     Cervical back: Normal range of motion and neck supple.  Skin:    General: Skin is warm.     Capillary Refill: Capillary refill takes more than 3 seconds.  Neurological:     Mental Status: She is disoriented.    ED Results / Procedures / Treatments   Labs (all labs ordered are listed, but only abnormal results are displayed) Labs Reviewed - No data to display  EKG None  Radiology No results found.  Procedures Procedures   Medications Ordered in ED Medications - No data to display  ED Course  I have reviewed the triage vital signs and the nursing notes.  Pertinent labs & imaging results that were available during my care of the patient were reviewed by me and considered in my medical decision making (see chart for details).    MDM Rules/Calculators/A&P                           When EMS arrived, I saw the DNR form.  I called her brother, Wendall Mola to discuss patient's wishes.  He wanted to continue with her wish to be DNR and he elected for comfort care measures and no pacemaker or cpr or intubation.  Pacer turned off and she passed away peacefully a few minutes later.  TOD 1902.  Pt's brother called and notified.  Pt also d/w Dr. Marco Collie who is on call for pt's pcp, Dr. Ortencia Kick.  Death certificate to go to the [facility].   CRITICAL CARE Performed by: Isla Pence   Total critical care time: 30 minutes  Critical care time was exclusive of separately billable procedures and treating other patients.  Critical care was necessary to treat or prevent imminent or life-threatening  deterioration.  Critical care was time spent personally by me on the following activities: development of treatment plan with patient and/or surrogate as well as nursing, discussions with consultants, evaluation of patient's response to treatment, examination of patient, obtaining history from patient or surrogate, ordering and performing treatments and interventions, ordering and review of laboratory studies, ordering and review of radiographic studies, pulse oximetry and re-evaluation of patient's condition.  Final Clinical Impression(s) / ED Diagnoses Final diagnoses:  Cardiopulmonary arrest [facility])    Rx / [place] Orders ED Discharge Orders     None        Isla Pence, MD 03/21/21 1955

## 2021-03-29 NOTE — Telephone Encounter (Signed)
Attempted to call [facility] for the second time. No answer and unable to leave a vm.  [phone]

## 2021-03-29 DEATH — deceased

## 2021-04-16 ENCOUNTER — Other Ambulatory Visit: Payer: Self-pay | Admitting: Cardiology

## 2021-04-28 ENCOUNTER — Ambulatory Visit: Payer: Medicare HMO | Admitting: Cardiology
